# Patient Record
Sex: Female | Born: 1935 | Race: White | Hispanic: No
Health system: Southern US, Community
[De-identification: ages and names within clinical notes are randomized; demographics above are authoritative.]

## PROBLEM LIST (undated history)

## (undated) DIAGNOSIS — R197 Diarrhea, unspecified: Secondary | ICD-10-CM

## (undated) DIAGNOSIS — M25469 Effusion, unspecified knee: Secondary | ICD-10-CM

## (undated) DIAGNOSIS — H3530 Unspecified macular degeneration: Secondary | ICD-10-CM

## (undated) DIAGNOSIS — M1990 Unspecified osteoarthritis, unspecified site: Secondary | ICD-10-CM

## (undated) DIAGNOSIS — H9190 Unspecified hearing loss, unspecified ear: Secondary | ICD-10-CM

## (undated) DIAGNOSIS — R300 Dysuria: Secondary | ICD-10-CM

## (undated) DIAGNOSIS — Z800 Family history of malignant neoplasm of digestive organs: Secondary | ICD-10-CM

## (undated) DIAGNOSIS — R071 Chest pain on breathing: Secondary | ICD-10-CM

## (undated) HISTORY — DX: Dysuria: R30.0

## (undated) HISTORY — PX: OTHER SURGICAL HISTORY: SHX169

## (undated) HISTORY — DX: Effusion, unspecified knee: M25.469

## (undated) HISTORY — DX: Chest pain on breathing: R07.1

## (undated) HISTORY — DX: Family history of malignant neoplasm of digestive organs: Z80.0

## (undated) HISTORY — DX: Diarrhea, unspecified: R19.7

## (undated) HISTORY — PX: APPENDECTOMY: SHX54

---

## 1991-11-05 HISTORY — PX: OTHER SURGICAL HISTORY: SHX169

## 2005-12-12 ENCOUNTER — Ambulatory Visit: Payer: Self-pay | Admitting: Family Medicine

## 2006-01-24 ENCOUNTER — Ambulatory Visit: Payer: Self-pay | Admitting: Family Medicine

## 2006-11-17 ENCOUNTER — Ambulatory Visit: Payer: Self-pay | Admitting: Family Medicine

## 2006-11-27 ENCOUNTER — Ambulatory Visit: Payer: Self-pay | Admitting: Family Medicine

## 2007-09-26 DIAGNOSIS — M25469 Effusion, unspecified knee: Secondary | ICD-10-CM

## 2007-09-29 ENCOUNTER — Ambulatory Visit: Payer: Self-pay | Admitting: Family Medicine

## 2007-10-06 ENCOUNTER — Ambulatory Visit: Payer: Self-pay | Admitting: Family Medicine

## 2008-01-19 ENCOUNTER — Telehealth: Payer: Self-pay | Admitting: Family Medicine

## 2008-07-17 DIAGNOSIS — R071 Chest pain on breathing: Secondary | ICD-10-CM

## 2008-07-20 ENCOUNTER — Ambulatory Visit: Payer: Self-pay | Admitting: Family Medicine

## 2009-09-19 ENCOUNTER — Encounter: Payer: Self-pay | Admitting: Family Medicine

## 2009-09-19 DIAGNOSIS — R197 Diarrhea, unspecified: Secondary | ICD-10-CM | POA: Insufficient documentation

## 2009-09-20 ENCOUNTER — Ambulatory Visit: Payer: Self-pay | Admitting: Internal Medicine

## 2009-09-20 ENCOUNTER — Telehealth (INDEPENDENT_AMBULATORY_CARE_PROVIDER_SITE_OTHER): Payer: Self-pay

## 2009-09-20 DIAGNOSIS — M1990 Unspecified osteoarthritis, unspecified site: Secondary | ICD-10-CM | POA: Insufficient documentation

## 2009-09-21 LAB — CONVERTED CEMR LAB
ALT: 14 units/L (ref 0–35)
AST: 21 units/L (ref 0–37)
Alkaline Phosphatase: 68 units/L (ref 39–117)
Basophils Absolute: 0 10*3/uL (ref 0.0–0.1)
Eosinophils Relative: 0.8 % (ref 0.0–5.0)
HCT: 41.2 % (ref 36.0–46.0)
Lymphs Abs: 1.1 10*3/uL (ref 0.7–4.0)
MCHC: 33.6 g/dL (ref 30.0–36.0)
MCV: 94.4 fL (ref 78.0–100.0)
Monocytes Absolute: 0.3 10*3/uL (ref 0.1–1.0)
Platelets: 198 10*3/uL (ref 150.0–400.0)
RDW: 11.5 % (ref 11.5–14.6)
Sodium: 145 meq/L (ref 135–145)
Total Bilirubin: 0.8 mg/dL (ref 0.3–1.2)
Total Protein: 6.7 g/dL (ref 6.0–8.3)

## 2009-10-06 ENCOUNTER — Ambulatory Visit: Payer: Self-pay | Admitting: Internal Medicine

## 2009-10-06 LAB — CONVERTED CEMR LAB
Fecal Occult Blood: NEGATIVE
OCCULT 1: NEGATIVE
OCCULT 5: NEGATIVE

## 2009-10-09 ENCOUNTER — Ambulatory Visit: Payer: Self-pay | Admitting: Family Medicine

## 2009-10-09 DIAGNOSIS — R300 Dysuria: Secondary | ICD-10-CM | POA: Insufficient documentation

## 2009-10-09 LAB — CONVERTED CEMR LAB
Glucose, Urine, Semiquant: NEGATIVE
Ketones, urine, test strip: NEGATIVE
Nitrite: NEGATIVE
Protein, U semiquant: 30

## 2009-10-16 ENCOUNTER — Telehealth: Payer: Self-pay | Admitting: Family Medicine

## 2009-10-23 ENCOUNTER — Telehealth: Payer: Self-pay | Admitting: Family Medicine

## 2009-10-30 ENCOUNTER — Ambulatory Visit: Payer: Self-pay | Admitting: Internal Medicine

## 2009-11-01 ENCOUNTER — Telehealth: Payer: Self-pay | Admitting: Family Medicine

## 2009-11-06 ENCOUNTER — Ambulatory Visit: Payer: Self-pay | Admitting: Family Medicine

## 2009-11-14 ENCOUNTER — Emergency Department (HOSPITAL_COMMUNITY): Admission: EM | Admit: 2009-11-14 | Discharge: 2009-11-14 | Payer: Self-pay | Admitting: Emergency Medicine

## 2009-11-21 ENCOUNTER — Telehealth: Payer: Self-pay | Admitting: Family Medicine

## 2009-12-13 ENCOUNTER — Ambulatory Visit: Payer: Self-pay | Admitting: Internal Medicine

## 2009-12-13 DIAGNOSIS — K3189 Other diseases of stomach and duodenum: Secondary | ICD-10-CM | POA: Insufficient documentation

## 2009-12-13 DIAGNOSIS — R1013 Epigastric pain: Secondary | ICD-10-CM

## 2009-12-14 ENCOUNTER — Ambulatory Visit: Payer: Self-pay | Admitting: Internal Medicine

## 2009-12-17 ENCOUNTER — Encounter (INDEPENDENT_AMBULATORY_CARE_PROVIDER_SITE_OTHER): Payer: Self-pay | Admitting: *Deleted

## 2009-12-17 ENCOUNTER — Telehealth: Payer: Self-pay | Admitting: Gastroenterology

## 2009-12-17 ENCOUNTER — Emergency Department (HOSPITAL_COMMUNITY): Admission: EM | Admit: 2009-12-17 | Discharge: 2009-12-17 | Payer: Self-pay | Admitting: Emergency Medicine

## 2009-12-18 ENCOUNTER — Telehealth: Payer: Self-pay | Admitting: Internal Medicine

## 2009-12-18 ENCOUNTER — Encounter: Payer: Self-pay | Admitting: Internal Medicine

## 2010-01-01 ENCOUNTER — Telehealth: Payer: Self-pay | Admitting: Family Medicine

## 2010-01-02 ENCOUNTER — Ambulatory Visit: Payer: Self-pay | Admitting: Family Medicine

## 2010-01-02 DIAGNOSIS — F3289 Other specified depressive episodes: Secondary | ICD-10-CM | POA: Insufficient documentation

## 2010-01-02 DIAGNOSIS — F329 Major depressive disorder, single episode, unspecified: Secondary | ICD-10-CM

## 2010-01-03 ENCOUNTER — Telehealth: Payer: Self-pay | Admitting: Family Medicine

## 2010-01-26 ENCOUNTER — Telehealth: Payer: Self-pay | Admitting: Internal Medicine

## 2010-01-30 ENCOUNTER — Telehealth: Payer: Self-pay | Admitting: Family Medicine

## 2010-01-31 ENCOUNTER — Telehealth: Payer: Self-pay | Admitting: Family Medicine

## 2010-02-12 ENCOUNTER — Ambulatory Visit: Payer: Self-pay | Admitting: Family Medicine

## 2010-02-12 LAB — CONVERTED CEMR LAB
Specific Gravity, Urine: >=1.03
WBC Urine, dipstick: NEGATIVE
pH: 5

## 2010-03-01 ENCOUNTER — Ambulatory Visit: Payer: Self-pay | Admitting: Family Medicine

## 2010-03-01 DIAGNOSIS — H9190 Unspecified hearing loss, unspecified ear: Secondary | ICD-10-CM | POA: Insufficient documentation

## 2010-06-12 ENCOUNTER — Encounter: Payer: Self-pay | Admitting: Family Medicine

## 2010-11-25 ENCOUNTER — Encounter: Payer: Self-pay | Admitting: Internal Medicine

## 2010-12-04 NOTE — Progress Notes (Signed)
Summary: uncomf urinating again t pt  Phone Note Call from Patient Call back at Home Phone [phone] Call back at [address] a friend [phone] or [phone]   Call For: k for todd Summary of Call: Finished the amoxicillin for the urinary problem on 16th.  Last night feeling started again, uncomfortable when she urinates.  Thinks she needs more or a different med.  Gweneth Dimitri & delivery requested.  Septra makes her sick.   Initial call taken by: Rudy Jew, RN,  January 26, 2010 11:20 AM  Follow-up for Phone Call        Phone Call Completed Follow-up by: Rudy Jew, RN,  January 26, 2010 1:16 PM    New/Updated Medications: CIPROFLOXACIN HCL 500 MG TABS (CIPROFLOXACIN HCL) One bid Prescriptions: CIPROFLOXACIN HCL 500 MG TABS (CIPROFLOXACIN HCL) One bid  #14 x 0   Entered by:   Rudy Jew, RN   Authorized by:   Gordy Savers  MD   Signed by:   Rudy Jew, RN on 01/26/2010   Method used:   Electronically to        [facility]* (retail)       [address]       [place], [place]  [address]       Ph: [phone] or [phone]       Fax: [phone]   RxID:   [id number]  Generic Cipro 500 mg, number 14 one twice daily

## 2010-12-04 NOTE — Letter (Signed)
Summary: Patient [facility] Biopsy Results  [facility]  [address] [place], [place] [address]   Phone: [phone]  Fax: [phone]        December 18, 2009 MRN: [id number]    [facility] [address] [place], [place]  [address]    Dear Ms. Edmonston,  I am pleased to inform you that the biopsies taken during your recent endoscopic examination did not show any evidence of cancer upon pathologic examination.The biopsy from Your stomach shows gastritis.  Additional information/recommendations:  __No further action is needed at this time.  Please follow-up with      your primary care physician for your other healthcare needs.  __ Please call [phone] to schedule a return visit to review      your condition.  _x_ Continue with the treatment plan as outlined on the day of your      exam.  Your gall bladder ultrasound  showed small polyps but no stones, I hope You are feeling better.   Please call us if you are having persistent problems or have questions about your condition that have not been fully answered at this time.  Sincerely,  Hart Carwin MD  This letter has been electronically signed by your physician.  Appended Document: Patient Notice-Endo Biopsy Results Letter mailed 2.16.11

## 2010-12-04 NOTE — Letter (Signed)
Summary: EGD Instructions  [facility]  [address] [place], [place] [address]   Phone: [phone]  Fax: [phone]       Deborah Gilbert    07-31-1920    MRN: [id number]       Procedure Day /Date: 12/14/09     Arrival Time: 2:30 pm     Procedure Time: 3:30 pm     Location of Procedure:                    _ x _ [facility] (4th Floor)   PREPARATION FOR ENDOSCOPY   On  THE DAY OF THE PROCEDURE:  1.   No solid foods, milk or milk products are allowed after midnight the night before your procedure.  2.   Do not drink anything colored red or purple.  Avoid juices with pulp.  No orange juice.  3.  You may drink clear liquids until 1:30 pm, which is 2 hours before your procedure.                                                                                                CLEAR LIQUIDS INCLUDE: Water Jello Ice Popsicles Tea (sugar ok, no milk/cream) Powdered fruit flavored drinks Coffee (sugar ok, no milk/cream) Gatorade Juice: apple, white grape, white cranberry  Lemonade Clear bullion, consomm, broth Carbonated beverages (any kind) Strained chicken noodle soup Hard Candy   MEDICATION INSTRUCTIONS  Unless otherwise instructed, you should take regular prescription medications with a small sip of water as early as possible the morning of your procedure.                OTHER INSTRUCTIONS  You will need a responsible adult at least 75 years of age to accompany you and drive you home.   This person must remain in the waiting room during your procedure.  Wear loose fitting clothing that is easily removed.  Leave jewelry and other valuables at home.  However, you may wish to bring a book to read or an iPod/MP3 player to listen to music as you wait for your procedure to start.  Remove all body piercing jewelry and leave at home.  Total time from sign-in until discharge is approximately 2-3 hours.  You should go home directly  after your procedure and rest.  You can resume normal activities the day after your procedure.  The day of your procedure you should not:   Drive   Make legal decisions   Operate machinery   Drink alcohol   Return to work  You will receive specific instructions about eating, activities and medications before you leave.    The above instructions have been reviewed and explained to me by   Hortense Ramal, CMA   I fully understand and can verbalize these instructions _____________________________ Date 12/13/09

## 2010-12-04 NOTE — Progress Notes (Signed)
Summary: [facility] about appt 3-29  Phone Note Call from Patient Call back at Home Phone [phone]   Caller: vm Mon 4:33 Reason for Call: Talk to Nurse Summary of Call: On Cipro.  Having fine tingling in foot all of a sudden.   Initial call taken by: Rudy Jew, RN,  January 30, 2010 8:29 AM  Follow-up for Phone Call        AM call same message.  Had awful night.  Says she cannot take the Cipro.  Had chest pain.   Rudy Jew, RN  January 30, 2010 8:31 AM Patient is very hard of hearing on phone.  Has to be well for dental surgery 4-14.   Offered ov.  She will call back if she can arrange this.  Rudy Jew, RN  January 30, 2010 8:38 AM    Additional Follow-up for Phone Call Additional follow up Details #1::        Pt cannot come in until tomorrow.  Appt given. Additional Follow-up by: Lynann Beaver CMA,  January 30, 2010 8:46 AM

## 2010-12-04 NOTE — Progress Notes (Signed)
Summary: med questions?  Phone Note Call from Patient   Caller: Mrs. Day? Call For: Roderick Pee MD Summary of Call: Pt wants to stop the Amoxicillin due to dizziness.  Advised not to stop the antibiotic, and we will tell Dr. Tawanna Cooler in the am.  She needs the antibiotic for the UTI.  Discussed with Fleet Contras also. Initial call taken by: Lynann Beaver CMA,  January 03, 2010 1:55 PM  Follow-up for Phone Call        Provider Notified Follow-up by: Roderick Pee MD,  January 04, 2010 8:36 AM

## 2010-12-04 NOTE — Progress Notes (Signed)
Summary: anxious  Phone Note Call from Patient   Caller: Patient Call For: Roderick Pee MD Summary of Call: Pt is calling very anxious and crying with extreme dizziness and weaknes.. Refuses to come to the office.  States she is too weak.  Feels she may have an UTI.  Vague symptoms.  "Just does not feel right when she passes urine.  No dysuria.  Had endoscopy 2 weeks ago. Please call and let her know what Dr Tawanna Cooler wants her to do.  She is very difficult to get any history from due to her anxiety level and she is extremely frustrated with her medical condition. [phone] Initial call taken by: Lynann Beaver CMA,  January 01, 2010 9:42 AM  Follow-up for Phone Call        if her symptoms are related to the questionable urinary tract issue that I need to see her here today, if it's related to anxiety that I would refer her to [facility] Follow-up by: Roderick Pee MD,  January 01, 2010 12:05 PM  Additional Follow-up for Phone Call Additional follow up Details #1::        Pt. will call if she can arrange to get here.  Refuses [facility]. Additional Follow-up by: Lynann Beaver CMA,  January 01, 2010 12:25 PM

## 2010-12-04 NOTE — Miscellaneous (Signed)
Summary: Health Care Power of [facility] Power of Attorney   Imported By: Maryln Gottron 07/04/2010 14:39:16  _____________________________________________________________________  External Attachment:    Type:   Image     Comment:   External Document

## 2010-12-04 NOTE — Progress Notes (Signed)
Summary: MD ON-CALL NOTE      This is a patient Dr. Lina Sar.      [name] called today at around noon.  She had an upper   endoscopy 5 days ago and it sounds as if it was fairly normal.  She   tolerated the procedure well and went home and has felt well ever since   then.  She actually felt stronger than her usual yesterday.  Today, she   woke up feeling very weak.  She has no abdominal pains, no nausea, no   vomiting.  She tolerated breakfast well.  She has had no overt bleeding.   She is clearly elderly, she sounds somewhat confused on the phone, she   has no chest pains or shortness of breath, but just keeps telling me she   is weak and she needs help.  I recommended that she hang up the phone   and dial 911.  It is not clear to me that she had any complication from   her routine upper endoscopy.            Rachael Fee, MD            DPJ/MedQ  DD: 12/17/2009  DT: 12/18/2009  Job #: [id number]

## 2010-12-04 NOTE — Progress Notes (Signed)
Summary: nerve pill  Phone Note Call from Patient   Caller: Deborah Gilbert Call For: Deborah Pee MD Summary of Call: Requesting a nerve pill??? [facility] [phone] Initial call taken by: Lynann Beaver CMA,  November 21, 2009 1:20 PM  Follow-up for Phone Call        OTC Benadryl 25 mg nightly, p.r.n. would not recommend medication with her age Follow-up by: Deborah Pee MD,  November 21, 2009 1:42 PM    New/Updated Medications: BENADRYL 25 MG CAPS (DIPHENHYDRAMINE HCL) one by mouth q hs as needed for sleepj Prescriptions: BENADRYL 25 MG CAPS (DIPHENHYDRAMINE HCL) one by mouth q hs as needed for sleepj  #30 x prn   Entered by:   Lynann Beaver CMA   Authorized by:   Deborah Pee MD   Signed by:   Lynann Beaver CMA on 11/21/2009   Method used:   Electronically to        [facility]  [phone]* (retail)       [address]       [place], [place]  [address]       Ph: [phone] or [phone]       Fax: [phone]   RxID:   [id number]

## 2010-12-04 NOTE — Assessment & Plan Note (Signed)
Summary: wants another ua test//ccm   Vital Signs:  Patient profile:   77 year old female Weight:      109 pounds Temp:     97.3 degrees F oral Pulse rate:   80 / minute BP sitting:   160 / 80  (left arm) Cuff size:   regular  Vitals Entered By: Kern Reap CMA Duncan Dull) (February 12, 2010 11:27 AM) CC: patient requests a UA   Primary Care Provider:  Eugenio Hoes. Tawanna Cooler, MD   CC:  patient requests a UA.  History of Present Illness: Deborah Gilbert is an 77 year old, widowed female, who comes in today accompanied by the church, person.  She has a chronic concern about her urine.  She wants it checked rather frequently.  She has no urinary tract complaints.  Urinalysis normal.  Advised her if she does have fever, chills, burning, etc., to come back for recheck.  Otherwise, her urinalysis is normal.  She continues to have other somatic complaints.  In the past.  I recommend we get a psychiatric consult.  The patient has so far refused.  [address] [phone]  has been contacted  Allergies: 1)  ! Tetracycline 2)  ! Pepcid  Past History:  Past medical, surgical, family and social histories (including risk factors) reviewed for relevance to current acute and chronic problems.  Past Medical History: Reviewed history from 12/07/2009 and no changes required. Current Problems:   CHEST WALL PAIN, ACUTE (ICD-786.52) JOINT EFFUSION, RIGHT KNEE (ICD-719.06)  DYSURIA (ICD-788.1) OSTEOARTHRITIS (ICD-715.9) FAMILY HX COLON CANCER (ICD-V16.0) DIARRHEA (ICD-787.91) CHEST WALL PAIN, ACUTE (ICD-786.52) JOINT EFFUSION, RIGHT KNEE (ICD-719.06)    Past Surgical History: Reviewed history from 09/20/2009 and no changes required. Appendectomy Hip sx-right-1993 CHILD BIRTH CATARACT SURGERY/BILTAERAL  Family History: Reviewed history from 12/07/2009 and no changes required. Fam hx Cirrhosis SON WITH HX COLON CA AGE 90.  Social History: Reviewed history from 09/20/2009 and no changes required. Former  Smoker Single Alcohol use-no Drug use-no Regular exercise-no Occupation: Retired   Review of Systems      See HPI  Physical Exam  General:  Well-developed,well-nourished,in no acute distress; alert,appropriate and cooperative throughout examination   Impression & Recommendations:  Problem # 1:  DYSURIA (ICD-788.1) Assessment Unchanged  Her updated medication list for this problem includes:    Amoxicillin 500 Mg Caps (Amoxicillin) .Marland Kitchen... Take 1 tablet by mouth two times a day    Ciprofloxacin Hcl 500 Mg Tabs (Ciprofloxacin hcl) ..... One bid  Orders: UA Dipstick w/o Micro (manual) ([id number])  Complete Medication List: 1)  Aspirin 81 Mg Tabs (Aspirin) .... One tablet by mouth once daily 2)  Multivitamins Tabs (Multiple vitamin) .... One tablet by mouth once daily 3)  Calcium-vitamin D 250-125 Mg-unit Tabs (Calcium carbonate-vitamin d) .... One tablet by mouth once daily 4)  Promethazine Hcl 12.5 Mg Tabs (Promethazine hcl) .... One every 6 hours as needed for nausea 5)  Benadryl 25 Mg Caps (Diphenhydramine hcl) .... One by mouth q hs as needed for sleep 6)  Promethazine Hcl 6.25 Mg/63ml Syrp (Promethazine hcl) .... Take 5 ml (6.25 mg) by mouth every 4-6 hours as needed for nausea 7)  Aciphex 20 Mg Tbec (Rabeprazole sodium) .... Take 1 tablet by mouth once a day (samples given) 8)  Amoxicillin 500 Mg Caps (Amoxicillin) .... Take 1 tablet by mouth two times a day 9)  Ciprofloxacin Hcl 500 Mg Tabs (Ciprofloxacin hcl) .... One bid  Patient Instructions: 1)  continue your current treatment program, your urinalysis is  normal  Laboratory Results   Urine Tests  Date/Time Received: February 12, 2010   Routine Urinalysis   Color: yellow Appearance: Clear Glucose: negative   (Normal Range: Negative) Bilirubin: negative   (Normal Range: Negative) Ketone: negative   (Normal Range: Negative) Spec. Gravity: >=1.030   (Normal Range: 1.003-1.035) Blood: trace-lysed   (Normal Range:  Negative) pH: 5.0   (Normal Range: 5.0-8.0) Protein: negative   (Normal Range: Negative) Urobilinogen: 0.2   (Normal Range: 0-1) Nitrite: negative   (Normal Range: Negative) Leukocyte Esterace: negative   (Normal Range: Negative)    Comments: Kern Reap CMA Duncan Dull)  February 12, 2010 11:54 AM

## 2010-12-04 NOTE — Progress Notes (Signed)
Summary: follow up  Phone Note Call from Patient   Summary of Call: patient states she had another "attack" last night.  She had some chest pain that went to her back.  No other pain or shortness of breath.  She had some trouble falling asleep but she feels better now.   Initial call taken by: Kern Reap CMA Duncan Dull),  January 31, 2010 9:33 AM  Follow-up for Phone Call        new order per dr todd Follow-up by: Kern Reap CMA Duncan Dull),  January 31, 2010 10:14 AM

## 2010-12-04 NOTE — Assessment & Plan Note (Signed)
Summary: FOLLOW UP-DIARRHEA/YF   History of Present Illness Visit Type: follow up  Primary GI MD: Lina Sar MD Primary Provider: Eugenio Hoes. Tawanna Cooler, MD  Requesting Provider: n/a Chief Complaint: Nausea and dizziness  History of Present Illness:   This is an 77 year old white female with a several month history of nausea and dyspepsia. She was seen here in November for diarrhea but that is not her current problem. She is complaining of epigastric pain anteriorly which is worse when sitting up and is relieved by laying down. It does not radiate to the back and it is not associated with eating. There is a family history of gallbladder disease in her maternal cousin. Her weight has remained stable. There has been no fever. Her hemoglobin in November 2010 was 13.8 and her hematocrit was 41.2. There is a positive family history of colon cancer in her son.   GI Review of Systems    Reports nausea.      Denies abdominal pain, acid reflux, belching, bloating, chest pain, dysphagia with liquids, dysphagia with solids, heartburn, loss of appetite, vomiting, vomiting blood, weight loss, and  weight gain.        Denies anal fissure, black tarry stools, change in bowel habit, constipation, diarrhea, diverticulosis, fecal incontinence, heme positive stool, hemorrhoids, irritable bowel syndrome, jaundice, light color stool, liver problems, rectal bleeding, and  rectal pain.    Current Medications (verified): 1)  Aspirin 81 Mg  Tabs (Aspirin) .... One Tablet By Mouth Once Daily 2)  Multivitamins   Tabs (Multiple Vitamin) .... One Tablet By Mouth Once Daily 3)  Calcium-Vitamin D 250-125 Mg-Unit Tabs (Calcium Carbonate-Vitamin D) .... One Tablet By Mouth Once Daily 4)  Septra Ds 800-160 Mg Tabs (Sulfamethoxazole-Trimethoprim) .... Take 1 Tablet By Mouth Two Times A Day 5)  Promethazine Hcl 12.5 Mg Tabs (Promethazine Hcl) .... One Every 6 Hours As Needed For Nausea 6)  Benadryl 25 Mg Caps (Diphenhydramine Hcl)  .... One By Mouth Q Hs As Needed For Sleep  Allergies: 1)  ! Tetracycline 2)  ! Pepcid  Past History:  Past Medical History: Reviewed history from 12/07/2009 and no changes required. Current Problems:   CHEST WALL PAIN, ACUTE (ICD-786.52) JOINT EFFUSION, RIGHT KNEE (ICD-719.06)  DYSURIA (ICD-788.1) OSTEOARTHRITIS (ICD-715.9) FAMILY HX COLON CANCER (ICD-V16.0) DIARRHEA (ICD-787.91) CHEST WALL PAIN, ACUTE (ICD-786.52) JOINT EFFUSION, RIGHT KNEE (ICD-719.06)    Past Surgical History: Reviewed history from 09/20/2009 and no changes required. Appendectomy Hip sx-right-1993 CHILD BIRTH CATARACT SURGERY/BILTAERAL  Family History: Reviewed history from 12/07/2009 and no changes required. Fam hx Cirrhosis SON WITH HX COLON CA AGE 28.  Social History: Reviewed history from 09/20/2009 and no changes required. Former Smoker Single Alcohol use-no Drug use-no Regular exercise-no Occupation: Retired   Review of Systems       The patient complains of fatigue.  The patient denies allergy/sinus, anemia, anxiety-new, arthritis/joint pain, back pain, blood in urine, breast changes/lumps, change in vision, confusion, cough, coughing up blood, depression-new, fainting, fever, headaches-new, hearing problems, heart murmur, heart rhythm changes, itching, menstrual pain, muscle pains/cramps, night sweats, nosebleeds, pregnancy symptoms, shortness of breath, skin rash, sleeping problems, sore throat, swelling of feet/legs, swollen lymph glands, thirst - excessive , urination - excessive , urination changes/pain, urine leakage, vision changes, and voice change.         Pertinent positive and negative review of systems were noted in the above HPI. All other ROS was otherwise negative.   Vital Signs:  Patient profile:  77 year old female Height:      63 inches Weight:      112 pounds BSA:     1.51 Pulse rate:   72 / minute Pulse rhythm:   regular BP sitting:   140 / 80  (left arm) Cuff  size:   regular  Vitals Entered By: Ok Anis CMA (December 13, 2009 2:01 PM)  Physical Exam  General:  elderly, hard of hearing, alert and oriented. She is complaining of feeling sick. Eyes:  PERRLA, no icterus. Mouth:  No deformity or lesions, dentition normal. Lungs:  Clear throughout to auscultation. Heart:  S1-S2, frequent irregular beats. Abdomen:  very tender epigastrium with normoactive bowel sounds and no rebound. No distention. No bruit. Liver edge at costal margin. No ascites. Extremities:  No clubbing, cyanosis, edema or deformities noted. Skin:  Intact without significant lesions or rashes. Psych:  Alert and cooperative. Normal mood and affect.   Impression & Recommendations:  Problem # 1:  DYSPEPSIA (ICD-536.8) Patiet has food intolerance and epigastric pain dating back to when patient took Septra. We need to rule out H. pylori gastritis ,rule or a gastric ulcer. We also need to rule out symptomatic gallbladder disease or a large hiatal hernia. We will start patient on samples of AcipHex 20 mg daily and schedule her for an upper endoscopy as well as upper abdominal ultrasound. She will take liquid Phenergan 6.25 mg every 4-6 hours p.r.n. nausea. Orders: EGD (EGD)  Problem # 2:  FAMILY HX COLON CANCER (ICD-V16.0) Patient has a history of colon cancer in her son. At this point there are no plans for colonoscopy.  Patient Instructions: 1)  Phenergan elixir 6.25 mg p.r.n. nausea. 2)  Samples of AcipHex 20 mg daily. 3)  Upper endoscopy tomorrow. 4)  Upper abdominal ultrasound. 5)  Consider colorectal screening depending on patient's general condition. 6)  Copy sent to : Dr Shela Commons.[name] 7)  The medication list was reviewed and reconciled.  All changed / newly prescribed medications were explained.  A complete medication list was provided to the patient / caregiver. Prescriptions: PROMETHAZINE HCL 6.25 MG/5ML SYRP (PROMETHAZINE HCL) Take 5 ml (6.25 mg) by mouth every 4-6 hours  as needed for nausea  #10 ounces x 0   Entered by:   Hortense Ramal CMA (AAMA)   Authorized by:   Hart Carwin MD   Signed by:   Hortense Ramal CMA (AAMA) on 12/13/2009   Method used:   Electronically to        [facility]  [phone]* (retail)       [address]       [place], [place]  [address]       Ph: [phone] or [phone]       Fax: [phone]   RxID:   [id number]   Appended Document: Orders Update    Clinical Lists Changes  Orders: Added new Test order of Ultrasound Abdomen (UAS) - Signed

## 2010-12-04 NOTE — Assessment & Plan Note (Signed)
Summary: fup//ccm   Vital Signs:  Patient profile:   77 year old female Temp:     97.8 degrees F oral Pulse rate:   86 / minute Pulse rhythm:   regular BP sitting:   120 / 90  (left arm) Cuff size:   regular  Vitals Entered By: Kern Reap CMA Duncan Dull) (January 02, 2010 12:18 PM)  Reason for Visit follow up and nervous  Primary Care Provider:  Eugenio Hoes. Tawanna Cooler, MD    History of Present Illness: Deborah Gilbert is 77 year old single female, resident at [facility], who is brought in by her friend for evaluation of multiple symptoms.  She recently went to [facility] ER  on the 13th of February by ambulance for evaluation of multiple complaints.  She had numerous diagnostic studies, which included lab work, EKG, and CT scan of her abdomen.  All of which were normal.  She complains of anxiety, lightheadedness, etc., etc., etc.  She also complains of dysuria.  She has no fever, chills, or back pain.  Urinalysis shows some nitrate, and 2+ blood, consistent with a cystitis  Allergies: 1)  ! Tetracycline 2)  ! Pepcid  Past History:  Past medical, surgical, family and social histories (including risk factors) reviewed for relevance to current acute and chronic problems.  Past Medical History: Reviewed history from 12/07/2009 and no changes required. Current Problems:   CHEST WALL PAIN, ACUTE (ICD-786.52) JOINT EFFUSION, RIGHT KNEE (ICD-719.06)  DYSURIA (ICD-788.1) OSTEOARTHRITIS (ICD-715.9) FAMILY HX COLON CANCER (ICD-V16.0) DIARRHEA (ICD-787.91) CHEST WALL PAIN, ACUTE (ICD-786.52) JOINT EFFUSION, RIGHT KNEE (ICD-719.06)    Past Surgical History: Reviewed history from 09/20/2009 and no changes required. Appendectomy Hip sx-right-1993 CHILD BIRTH CATARACT SURGERY/BILTAERAL  Family History: Reviewed history from 12/07/2009 and no changes required. Fam hx Cirrhosis SON WITH HX COLON CA AGE 32.  Social History: Reviewed history from 09/20/2009 and no changes  required. Former Smoker Single Alcohol use-no Drug use-no Regular exercise-no Occupation: Retired   Review of Systems      See HPI  Physical Exam  General:  Well-developed,well-nourished,in no acute distress; alert,appropriate and cooperative throughout examination   Problems:  Medical Problems Added: 1)  Dx of Depressive Disorder  (ICD-311)  Impression & Recommendations:  Problem # 1:  DYSURIA (ICD-788.1) Assessment Deteriorated  The following medications were removed from the medication list:    Septra Ds 800-160 Mg Tabs (Sulfamethoxazole-trimethoprim) .Marland Kitchen... Take 1 tablet by mouth two times a day x 7 days, then 1/2 tab daily x 3 weeks Her updated medication list for this problem includes:    Amoxicillin 500 Mg Caps (Amoxicillin) .Marland Kitchen... Take 1 tablet by mouth two times a day  Orders: Prescription Created Electronically [phone])  Problem # 2:  DEPRESSIVE DISORDER (ICD-311) Assessment: Deteriorated  Orders: Prescription Created Electronically [id number])  Complete Medication List: 1)  Aspirin 81 Mg Tabs (Aspirin) .... One tablet by mouth once daily 2)  Multivitamins Tabs (Multiple vitamin) .... One tablet by mouth once daily 3)  Calcium-vitamin D 250-125 Mg-unit Tabs (Calcium carbonate-vitamin d) .... One tablet by mouth once daily 4)  Promethazine Hcl 12.5 Mg Tabs (Promethazine hcl) .... One every 6 hours as needed for nausea 5)  Benadryl 25 Mg Caps (Diphenhydramine hcl) .... One by mouth q hs as needed for sleep 6)  Promethazine Hcl 6.25 Mg/74ml Syrp (Promethazine hcl) .... Take 5 ml (6.25 mg) by mouth every 4-6 hours as needed for nausea 7)  Aciphex 20 Mg Tbec (Rabeprazole sodium) .... Take 1 tablet by mouth  once a day (samples given) 8)  Amoxicillin 500 Mg Caps (Amoxicillin) .... Take 1 tablet by mouth two times a day  Patient Instructions: 1)  begin amoxicillin 500 mg twice a day for two weeks. 2)  Return p.r.n.Marland Kitchen 3)  Discussed with the cousin, who is  power-of-attorney.  A more advanced to level of care.  Another option would be a psychiatric consult Prescriptions: AMOXICILLIN 500 MG CAPS (AMOXICILLIN) Take 1 tablet by mouth two times a day  #30 x 0   Entered and Authorized by:   Roderick Pee MD   Signed by:   Roderick Pee MD on 01/02/2010   Method used:   Electronically to        [facility]  [phone]* (retail)       [address]       [place], [place]  [address]       Ph: [phone] or [phone]       Fax: [phone]   RxID:   [id number] SEPTRA DS 800-160 MG TABS (SULFAMETHOXAZOLE-TRIMETHOPRIM) Take 1 tablet by mouth two times a day x 7 days, then 1/2 tab daily x 3 weeks  #25 x 0   Entered and Authorized by:   Roderick Pee MD   Signed by:   Roderick Pee MD on 01/02/2010   Method used:   Electronically to        [facility]  [phone]* (retail)       [address]       [place], [place]  [address]       Ph: [phone] or [phone]       Fax: [phone]   RxID:   [id number]

## 2010-12-04 NOTE — Assessment & Plan Note (Signed)
Summary: ? bladder infection-go to lab first per rachel//ccm   Vital Signs:  Patient profile:   77 year old female Temp:     98.7 degrees F oral BP sitting:   130 / 80  (left arm) Cuff size:   regular  Vitals Entered By: Kern Reap CMA Duncan Dull) (November 06, 2009 10:36 AM)  Reason for Visit light headed, nausea, polyuria  Primary Care Provider:  Eugenio Hoes. Tawanna Cooler, MD    History of Present Illness: Deborah Gilbert is an 77 year old single female, who resides in a retirement center, who is brought in by her ex-pastors wife for evaluation of nausea.  We saw her about a month ago for urinary tract infection.  Start her on Septra DS b.i.d. however, she complained of nausea.  We therefore stopped the medication.  She continues to complain of nausea.  She has no fever, vomiting, or diarrhea.  She was recently told she has macular degeneration and the caregiver, who accompanies her today, says she's been extremely worried about her eye condition.  Review of systems otherwise negative except the caregiver says she has frequent urination.  However, today she is urinating normally.  UA within normal limits  she was also seen by my partner, Dr. Kirtland Bouchard. last week for the same symptoms.  His evaluation was negative.  He gave her some Phenergan as a temporary measure, but it's not resolve the symptoms  Allergies: 1)  ! Tetracycline 2)  ! Pepcid  Past History:  Past medical, surgical, family and social histories (including risk factors) reviewed for relevance to current acute and chronic problems.  Past Medical History: Reviewed history from 09/20/2009 and no changes required. Current Problems:   CHEST WALL PAIN, ACUTE (ICD-786.52) JOINT EFFUSION, RIGHT KNEE (ICD-719.06)  Past Surgical History: Reviewed history from 09/20/2009 and no changes required. Appendectomy Hip sx-right-1993 CHILD BIRTH CATARACT SURGERY/BILTAERAL  Family History: Reviewed history from 09/20/2009 and no changes  required. Fam hx Cirrhosis,SON WITH HX COLON CA AGE 31.  Social History: Reviewed history from 09/20/2009 and no changes required. Former Smoker Single Alcohol use-no Drug use-no Regular exercise-no Occupation: Retired   Review of Systems      See HPI  Physical Exam  General:  Well-developed,well-nourished,in no acute distress; alert,appropriate and cooperative throughout examination Abdomen:  Bowel sounds positive,abdomen soft and non-tender without masses, organomegaly or hernias noted.   Impression & Recommendations:  Problem # 1:  NAUSEA (ICD-787.02) Assessment Unchanged  Complete Medication List: 1)  Aspirin 81 Mg Tabs (Aspirin) .... One tablet by mouth once daily 2)  Multivitamins Tabs (Multiple vitamin) .... One tablet by mouth once daily 3)  Calcium-vitamin D 250-125 Mg-unit Tabs (Calcium carbonate-vitamin d) .... One tablet by mouth once daily 4)  Septra Ds 800-160 Mg Tabs (Sulfamethoxazole-trimethoprim) .... Take 1 tablet by mouth two times a day 5)  Promethazine Hcl 12.5 Mg Tabs (Promethazine hcl) .... One every 6 hours as needed for nausea  Other Orders: UA Dipstick w/o Micro (manual) (16109)  Patient Instructions: 1)  since the nausea has persisted call Dr. Julio Alm for consultation

## 2010-12-04 NOTE — Progress Notes (Signed)
Summary: TRIAGE  Phone Note Call from Patient Call back at Home Phone [phone]   Caller: Patient Call For: Dr. Juanda Chance Reason for Call: Talk to Nurse Summary of Call: Pt. went to ER yesterday and had Ultrasound done. Initial call taken by: Karna Christmas,  December 18, 2009 2:29 PM  Follow-up for Phone Call        Pt. went to the ER yesterday for weakness, she had her Ultrasound done while there. She is feeling well today.   DR.Vineta Carone PLEASE REVIEW ULTRASOUND AND ADVISE.  Follow-up by: Laureen Ochs LPN,  December 18, 2009 3:18 PM  Additional Follow-up for Phone Call Additional follow up Details #1::        please see my append after sonogram report. Additional Follow-up by: Hart Carwin MD,  December 18, 2009 9:36 PM

## 2010-12-04 NOTE — Assessment & Plan Note (Signed)
Summary: ear pain ok per judy/njr/pt rescd to dr todd//ccm   Vital Signs:  Patient profile:   77 year old female Height:      63 inches Weight:      113 pounds BMI:     20.09 Temp:     97.8 degrees F oral BP sitting:   120 / 80  (left arm) Cuff size:   regular  Vitals Entered By: Kern Reap CMA Duncan Dull) (March 01, 2010 2:32 PM) CC: lightheaded, clogged left ear   Primary Care Provider:  Eugenio Hoes. Tawanna Cooler, MD   CC:  lightheaded and clogged left ear.  History of Present Illness: Deborah Gilbert  is an 77 year old female, who is brought in by the minister.  His wife, who is one of her caregivers for evaluation of hearing loss in her left ear.  She's had long-term intermittent vertigo, but now she has hearing loss.  Examination shows a cerumen impaction, which was relieved by suction by me  Allergies: 1)  ! Tetracycline 2)  ! Pepcid  Past History:  Past medical, surgical, family and social histories (including risk factors) reviewed for relevance to current acute and chronic problems.  Past Medical History: Reviewed history from 12/07/2009 and no changes required. Current Problems:   CHEST WALL PAIN, ACUTE (ICD-786.52) JOINT EFFUSION, RIGHT KNEE (ICD-719.06)  DYSURIA (ICD-788.1) OSTEOARTHRITIS (ICD-715.9) FAMILY HX COLON CANCER (ICD-V16.0) DIARRHEA (ICD-787.91) CHEST WALL PAIN, ACUTE (ICD-786.52) JOINT EFFUSION, RIGHT KNEE (ICD-719.06)    Past Surgical History: Reviewed history from 09/20/2009 and no changes required. Appendectomy Hip sx-right-1993 CHILD BIRTH CATARACT SURGERY/BILTAERAL  Family History: Reviewed history from 12/07/2009 and no changes required. Fam hx Cirrhosis SON WITH HX COLON CA AGE 80.  Social History: Reviewed history from 09/20/2009 and no changes required. Former Smoker Single Alcohol use-no Drug use-no Regular exercise-no Occupation: Retired   Review of Systems      See HPI  Physical Exam  General:  Well-developed,well-nourished,in  no acute distress; alert,appropriate and cooperative throughout examination Head:  Normocephalic and atraumatic without obvious abnormalities. No apparent alopecia or balding. Eyes:  No corneal or conjunctival inflammation noted. EOMI. Perrla. Funduscopic exam benign, without hemorrhages, exudates or papilledema. Vision grossly normal. Ears:  right ear clear left ear.  Cerumen impaction relieved by suction   Impression & Recommendations:  Problem # 1:  DECREASED HEARING, LEFT EAR (ICD-389.9) Assessment New  Complete Medication List: 1)  Aspirin 81 Mg Tabs (Aspirin) .... One tablet by mouth once daily 2)  Multivitamins Tabs (Multiple vitamin) .... One tablet by mouth once daily 3)  Calcium-vitamin D 250-125 Mg-unit Tabs (Calcium carbonate-vitamin d) .... One tablet by mouth once daily 4)  Promethazine Hcl 12.5 Mg Tabs (Promethazine hcl) .... One every 6 hours as needed for nausea 5)  Benadryl 25 Mg Caps (Diphenhydramine hcl) .... One by mouth q hs as needed for sleep 6)  Promethazine Hcl 6.25 Mg/68ml Syrp (Promethazine hcl) .... Take 5 ml (6.25 mg) by mouth every 4-6 hours as needed for nausea 7)  Aciphex 20 Mg Tbec (Rabeprazole sodium) .... Take 1 tablet by mouth once a day (samples given)  Patient Instructions: 1)  Please schedule a follow-up appointment as needed.

## 2010-12-04 NOTE — Procedures (Signed)
Summary: Upper Endoscopy  Patient: Deborah Gilbert Note: All result statuses are Final unless otherwise noted.  Tests: (1) Upper Endoscopy (EGD)   EGD Upper Endoscopy       DONE     [facility]     [address]     [place], [place]  [address]           ENDOSCOPY PROCEDURE REPORT           PATIENT:  Deborah Gilbert, Deborah Gilbert  MR#:  [id number]     BIRTHDATE:  [birth year], 89 yrs. old  GENDER:  female           ENDOSCOPIST:  Hedwig Morton. Juanda Chance, MD     Referred by:  Eugenio Hoes Tawanna Cooler, M.D.           PROCEDURE DATE:  12/14/2009     PROCEDURE:  EGD with biopsy     ASA CLASS:  Class III     INDICATIONS:  abdominal pain epigastric pain, worse with eating,     and when sitting up, kyphosis,           MEDICATIONS:   Versed 2 mg, Fentanyl 25 mcg     TOPICAL ANESTHETIC:  Exactacain Spray           DESCRIPTION OF PROCEDURE:   After the risks benefits and     alternatives of the procedure were thoroughly explained, informed     consent was obtained.  The LB GIF-H180 [id number] endoscope was     introduced through the mouth and advanced to the second portion of     the duodenum, without limitations.  The instrument was slowly     withdrawn as the mucosa was fully examined.     <<PROCEDUREIMAGES>>           The esophagus and gastroesophageal junction were completely normal     in appearance (see image1). mildly tortuous esophagus  irregular     Z-line (see image8, image7, and image2). <1cm long tongue of     gastric mucosa extending proximally  Otherwise the examination was     normal. r/o H.Pylori With standard forceps, a biopsy was obtained     and sent to pathology (see image4, image5, image6, and image3).     Retroflexed views revealed no abnormalities.    The scope was then     withdrawn from the patient and the procedure completed.           COMPLICATIONS:  None           ENDOSCOPIC IMPRESSION:     1) Normal esophagus     2) Irregular Z-line     3) Otherwise normal examination     nothing  to account for the abdominal pain,     RECOMMENDATIONS:     1) await biopsy results     upper abdominal ultrasound scheduled     samples of Aciphex given x 10 days     Phenergan elixir 6.25 mh prn nausea           REPEAT EXAM:  In 0 year(s) for.           ______________________________     Hedwig Morton. Juanda Chance, MD           CC:           n.     eSIGNED:   Hedwig Morton. Jaunice Mirza at 12/14/2009 04:15 PM           Kerry Kass,  [id number]  Note: An exclamation mark (!) indicates a result that was not dispersed into the flowsheet. Document Creation Date: 12/14/2009 4:16 PM _______________________________________________________________________  (1) Order result status: Final Collection or observation date-time: 12/14/2009 16:05 Requested date-time:  Receipt date-time:  Reported date-time:  Referring Physician:   Ordering Physician: Lina Sar [phone]) Specimen Source:  Source: Launa Grill Order Number: [id number] Lab site:

## 2011-01-20 LAB — CBC
Hemoglobin: 13.7 g/dL (ref 12.0–15.0)
MCHC: 34.9 g/dL (ref 30.0–36.0)
Platelets: 206 10*3/uL (ref 150–400)
RDW: 13.1 % (ref 11.5–15.5)

## 2011-01-20 LAB — URINALYSIS, ROUTINE W REFLEX MICROSCOPIC
Ketones, ur: NEGATIVE mg/dL
Leukocytes, UA: NEGATIVE
Protein, ur: NEGATIVE mg/dL
Urobilinogen, UA: 0.2 mg/dL (ref 0.0–1.0)

## 2011-01-20 LAB — DIFFERENTIAL
Lymphocytes Relative: 13 % (ref 12–46)
Lymphs Abs: 0.6 10*3/uL — ABNORMAL LOW (ref 0.7–4.0)
Monocytes Relative: 7 % (ref 3–12)
Neutro Abs: 3.8 10*3/uL (ref 1.7–7.7)
Neutrophils Relative %: 79 % — ABNORMAL HIGH (ref 43–77)

## 2011-01-20 LAB — COMPREHENSIVE METABOLIC PANEL
Albumin: 3.5 g/dL (ref 3.5–5.2)
Alkaline Phosphatase: 69 U/L (ref 39–117)
BUN: 9 mg/dL (ref 6–23)
CO2: 27 mEq/L (ref 19–32)
Chloride: 103 mEq/L (ref 96–112)
GFR calc Af Amer: >60 mL/min (ref >60)
GFR calc non Af Amer: 55 mL/min — ABNORMAL LOW (ref >60)
Glucose, Bld: 113 mg/dL — ABNORMAL HIGH (ref 70–99)
Potassium: 4 mEq/L (ref 3.5–5.1)
Total Bilirubin: 0.5 mg/dL (ref 0.3–1.2)

## 2011-01-20 LAB — URINE CULTURE

## 2011-01-20 LAB — URINE MICROSCOPIC-ADD ON

## 2011-01-20 LAB — POCT CARDIAC MARKERS
CKMB, poc: 1.1 ng/mL (ref 1.0–8.0)
Troponin i, poc: <0.05 ng/mL (ref 0.00–0.09)
Troponin i, poc: <0.05 ng/mL (ref 0.00–0.09)

## 2011-01-20 LAB — PROTIME-INR: INR: 1.04 (ref 0.00–1.49)

## 2011-01-24 LAB — DIFFERENTIAL
Basophils Absolute: 0 K/uL (ref 0.0–0.1)
Basophils Relative: 1 % (ref 0–1)
Eosinophils Absolute: 0 K/uL (ref 0.0–0.7)
Eosinophils Relative: 1 % (ref 0–5)
Lymphocytes Relative: 19 % (ref 12–46)
Lymphs Abs: 1 10*3/uL (ref 0.7–4.0)
Monocytes Absolute: 0.4 K/uL (ref 0.1–1.0)
Monocytes Relative: 8 % (ref 3–12)
Neutro Abs: 3.7 K/uL (ref 1.7–7.7)
Neutrophils Relative %: 72 % (ref 43–77)

## 2011-01-24 LAB — BASIC METABOLIC PANEL
BUN: 14 mg/dL (ref 6–23)
CO2: 24 mEq/L (ref 19–32)
Chloride: 108 mEq/L (ref 96–112)
GFR calc Af Amer: >60 mL/min (ref >60)
Glucose, Bld: 113 mg/dL — ABNORMAL HIGH (ref 70–99)
Potassium: 4.4 mEq/L (ref 3.5–5.1)

## 2011-01-24 LAB — CBC
HCT: 42 % (ref 36.0–46.0)
Hemoglobin: 14.3 g/dL (ref 12.0–15.0)
MCHC: 34.1 g/dL (ref 30.0–36.0)
MCV: 93.5 fL (ref 78.0–100.0)
Platelets: 211 10*3/uL (ref 150–400)
RBC: 4.49 MIL/uL (ref 3.87–5.11)
RDW: 13.1 % (ref 11.5–15.5)
WBC: 5.2 10*3/uL (ref 4.0–10.5)

## 2011-01-24 LAB — BASIC METABOLIC PANEL WITH GFR
Calcium: 9.2 mg/dL (ref 8.4–10.5)
Creatinine, Ser: 0.84 mg/dL (ref 0.4–1.2)
GFR calc non Af Amer: >60 mL/min (ref >60)
Sodium: 141 meq/L (ref 135–145)

## 2011-01-24 LAB — URINALYSIS, ROUTINE W REFLEX MICROSCOPIC
Bilirubin Urine: NEGATIVE
Glucose, UA: NEGATIVE mg/dL
Ketones, ur: NEGATIVE mg/dL
Leukocytes, UA: NEGATIVE
Nitrite: NEGATIVE
Protein, ur: NEGATIVE mg/dL
Specific Gravity, Urine: 1.009 (ref 1.005–1.030)
Urobilinogen, UA: 0.2 mg/dL (ref 0.0–1.0)
pH: 7.5 (ref 5.0–8.0)

## 2011-01-24 LAB — HEMOCCULT GUIAC POC 1CARD (OFFICE): Fecal Occult Bld: NEGATIVE

## 2011-01-24 LAB — URINE MICROSCOPIC-ADD ON

## 2011-01-24 LAB — POCT CARDIAC MARKERS
CKMB, poc: <1 ng/mL — ABNORMAL LOW (ref 1.0–8.0)
Myoglobin, poc: 82.8 ng/mL (ref 12–200)
Troponin i, poc: <0.05 ng/mL (ref 0.00–0.09)

## 2011-02-25 ENCOUNTER — Encounter: Payer: Self-pay | Admitting: Family Medicine

## 2011-02-26 ENCOUNTER — Ambulatory Visit (INDEPENDENT_AMBULATORY_CARE_PROVIDER_SITE_OTHER): Payer: Medicare Other | Admitting: Family Medicine

## 2011-02-26 ENCOUNTER — Encounter: Payer: Self-pay | Admitting: Family Medicine

## 2011-02-26 VITALS — BP 120/80 | Temp 98.1°F | Ht 60.0 in | Wt 106.0 lb

## 2011-02-26 DIAGNOSIS — N390 Urinary tract infection, site not specified: Secondary | ICD-10-CM

## 2011-02-26 LAB — POCT URINALYSIS DIPSTICK
Ketones, UA: NEGATIVE
Leukocytes, UA: NEGATIVE
Protein, UA: NEGATIVE
Urobilinogen, UA: 0.2

## 2011-02-26 NOTE — Progress Notes (Signed)
  Subjective:    Patient ID: Deborah Gilbert, female    DOB: Dec 11, 1919, 77 y.o.   MRN: [id number]  HPIWilma is 77 year old female, extremely hard of hearing, who comes in today for evaluation of urinary frequency and dysuria.  She states that 5 days ago she began having symptoms of dysuria.  She drank lots of water in her symptoms when a wide however, she would like her urine checked today    Review of Systems    In general, and neurologic review of systems otherwise negative Objective:   Physical Exam    Well-developed well-nourished, female, extremely hard of hearing.  No acute distress.  Abdomen negative    Assessment & Plan:  UTI treated with lots of water, now asymptomatic

## 2011-02-26 NOTE — Patient Instructions (Signed)
Drink for 8 ounces glasses of water daily.  Return care

## 2011-03-01 ENCOUNTER — Ambulatory Visit: Payer: Medicare Other | Admitting: Family Medicine

## 2011-03-01 ENCOUNTER — Encounter: Payer: Self-pay | Admitting: Internal Medicine

## 2011-03-01 ENCOUNTER — Ambulatory Visit (INDEPENDENT_AMBULATORY_CARE_PROVIDER_SITE_OTHER): Payer: Medicare Other | Admitting: Internal Medicine

## 2011-03-01 VITALS — BP 120/70 | HR 84 | Temp 97.9°F | Wt 108.0 lb

## 2011-03-01 DIAGNOSIS — R300 Dysuria: Secondary | ICD-10-CM

## 2011-03-01 DIAGNOSIS — N390 Urinary tract infection, site not specified: Secondary | ICD-10-CM

## 2011-03-01 LAB — POCT URINALYSIS DIPSTICK
Nitrite, UA: NEGATIVE
pH, UA: 5

## 2011-03-01 MED ORDER — CIPROFLOXACIN HCL 500 MG PO TABS: 500.0000 mg | ORAL_TABLET | Freq: Two times a day (BID) | ORAL | Status: AC

## 2011-03-01 NOTE — Progress Notes (Signed)
  Subjective:    Patient ID: Deborah Gilbert, female    DOB: 09/05/1920, 77 y.o.   MRN: [id number]  HPI  77 year old patient who presents with a one-week history of intermittent burning dysuria urgency. She was seen 3 days ago and UA was negative symptoms have waxed and waned. She complains of burning dysuria with frequency today the UA was reviewed and revealed trace leukocytes and small amount of blood.    Review of Systems  Genitourinary: Positive for dysuria, urgency, frequency, decreased urine volume and difficulty urinating. Negative for hematuria, flank pain, vaginal bleeding, vaginal discharge, vaginal pain and pelvic pain.       Objective:   Physical Exam  Constitutional: She appears well-developed and well-nourished. No distress.  Abdominal: Soft. Bowel sounds are normal.          Assessment & Plan:   Intermittent dysuria with trace pyuria. We'll empirically treat with Cipro for 5 days. If symptoms persist will consider an empiric trial of Premarin vaginal cream

## 2011-03-01 NOTE — Patient Instructions (Signed)
Antibiotic therapy was prescribed for the child due to specific medical indications.  Drink as much fluid as you  can tolerate over the next few days  Call or return to clinic prn if these symptoms worsen or fail to improve as anticipated.

## 2011-03-11 ENCOUNTER — Telehealth: Payer: Self-pay | Admitting: Family Medicine

## 2011-03-11 MED ORDER — ESTROGENS, CONJUGATED 0.625 MG/GM VA CREA: TOPICAL_CREAM | VAGINAL | Status: DC

## 2011-03-11 NOTE — Telephone Encounter (Signed)
Pt came in to see Dr Amador Cunas on 03/01/11 for bladder inf. Pt is still uncofortable and Dr Amador Cunas suggested that she might have a vaginal dryness and that pt could call in and ask for a cream to help with this. Pls call in cream to [facility] on [address] so that med could be delivered by pharmacy.

## 2011-03-11 NOTE — Telephone Encounter (Signed)
Addended by: Kern Reap on: 03/11/2011 05:35 PM   Modules accepted: Orders

## 2011-03-11 NOTE — Telephone Encounter (Signed)
Premarin vaginal cream,,,,,,,,, dispensed two tubes,,,,,,, directions apply small amounts 3 times weekly,,,,,,, if symptoms persist. ,,,,,,,,,,,, to the urologist

## 2011-03-14 ENCOUNTER — Other Ambulatory Visit: Payer: Self-pay | Admitting: Family Medicine

## 2011-03-14 ENCOUNTER — Ambulatory Visit: Payer: Medicare Other | Admitting: Family Medicine

## 2011-03-14 DIAGNOSIS — R300 Dysuria: Secondary | ICD-10-CM

## 2011-03-14 DIAGNOSIS — N390 Urinary tract infection, site not specified: Secondary | ICD-10-CM

## 2011-03-19 NOTE — Assessment & Plan Note (Signed)
[facility]                                 ON-CALL NOTE   Deborah Gilbert, Deborah Gilbert                      MRN:          [id number]  DATE:12/17/2009                            DOB:          Mar 19, 1920    This is a patient Dr. Lina Sar.   Deborah Gilbert called today at around noon.  She had an upper  endoscopy 5 days ago and it sounds as if it was fairly normal.  She  tolerated the procedure well and went home and has felt well ever since  then.  She actually felt stronger than her usual yesterday.  Today, she  woke up feeling very weak.  She has no abdominal pains, no nausea, no  vomiting.  She tolerated breakfast well.  She has had no overt bleeding.  She is clearly elderly, she sounds somewhat confused on the phone, she  has no chest pains or shortness of breath, but just keeps telling me she  is weak and she needs help.  I recommended that she hang up the phone  and dial 911.  It is not clear to me that she had any complication from  her routine upper endoscopy.     Rachael Fee, MD     DPJ/MedQ  DD: 12/17/2009  DT: 12/18/2009  Job #: [id number]

## 2011-07-05 ENCOUNTER — Other Ambulatory Visit: Payer: Self-pay | Admitting: Dermatology

## 2011-08-01 ENCOUNTER — Ambulatory Visit (INDEPENDENT_AMBULATORY_CARE_PROVIDER_SITE_OTHER): Payer: Medicare Other | Admitting: Family Medicine

## 2011-08-01 ENCOUNTER — Encounter: Payer: Self-pay | Admitting: Family Medicine

## 2011-08-01 VITALS — BP 130/70 | Temp 97.8°F | Wt 102.0 lb

## 2011-08-01 DIAGNOSIS — R42 Dizziness and giddiness: Secondary | ICD-10-CM

## 2011-08-01 NOTE — Patient Instructions (Signed)
We will call you next week with the report on your blood work.  [facility], Dr. Jenne Pane for a consult

## 2011-08-01 NOTE — Progress Notes (Signed)
  Subjective:    Patient ID: Deborah Gilbert, female    DOB: 1919-12-20, 77 y.o.   MRN: [id number]  HPI Deborah Gilbert is 77 year old single female, who resides at a retirement center, who comes in today for evaluation of vertigo.  It is extremely difficult to get a coherent history.  The best I can tell.  She has a sensation of rocking that comes and goes.  She feels better.  She lies flat.  She also feels better at night.  She, states she's read in a medical book that there are causes for this can be carried.  She also states that her sister had the same problem.  It took Antivert and vertigo went away.  She has seen Dr. Jenne Pane in the past.  Her ear, nose, and throat specialist for other reasons, and would like to see him again.  She also states the electrolyte abnormalities can cause vertigo, and she would like some blood tests done   Review of Systems    General ENT.  Neurologic review of systems otherwise negative Objective:   Physical Exam  Well-developed well-nourished, female, in no acute distress.  Neurologic exam normal.  Walks with a 4 .walk or      Assessment & Plan:

## 2011-08-02 LAB — CBC WITH DIFFERENTIAL/PLATELET
Basophils Relative: 0.3 % (ref 0.0–3.0)
Eosinophils Relative: 1.3 % (ref 0.0–5.0)
Lymphocytes Relative: 22.4 % (ref 12.0–46.0)
Monocytes Absolute: 0.3 10*3/uL (ref 0.1–1.0)
Monocytes Relative: 5.7 % (ref 3.0–12.0)
Neutrophils Relative %: 70.3 % (ref 43.0–77.0)
Platelets: 187 10*3/uL (ref 150.0–400.0)
RBC: 4.2 Mil/uL (ref 3.87–5.11)
WBC: 4.9 10*3/uL (ref 4.5–10.5)

## 2011-08-02 LAB — BASIC METABOLIC PANEL
BUN: 23 mg/dL (ref 6–23)
Calcium: 9.1 mg/dL (ref 8.4–10.5)
Creatinine, Ser: 0.8 mg/dL (ref 0.4–1.2)
GFR: 71.78 mL/min (ref >60.00)

## 2011-08-02 LAB — VITAMIN B12: Vitamin B-12: 586 pg/mL (ref 211–911)

## 2011-10-16 ENCOUNTER — Other Ambulatory Visit: Payer: Self-pay | Admitting: Neurology

## 2011-10-16 DIAGNOSIS — R269 Unspecified abnormalities of gait and mobility: Secondary | ICD-10-CM

## 2011-10-16 DIAGNOSIS — R42 Dizziness and giddiness: Secondary | ICD-10-CM

## 2012-01-22 ENCOUNTER — Ambulatory Visit (INDEPENDENT_AMBULATORY_CARE_PROVIDER_SITE_OTHER)
Admission: RE | Admit: 2012-01-22 | Discharge: 2012-01-22 | Disposition: A | Discharge status: Home / Self Care | Payer: Medicare Other | Source: Ambulatory Visit | Attending: Family | Admitting: Family

## 2012-01-22 ENCOUNTER — Encounter: Payer: Self-pay | Admitting: Family

## 2012-01-22 ENCOUNTER — Telehealth: Payer: Self-pay | Admitting: Family Medicine

## 2012-01-22 ENCOUNTER — Ambulatory Visit (INDEPENDENT_AMBULATORY_CARE_PROVIDER_SITE_OTHER): Payer: Medicare Other | Admitting: Family

## 2012-01-22 VITALS — BP 132/60 | Temp 98.7°F | Wt 97.0 lb

## 2012-01-22 DIAGNOSIS — R0602 Shortness of breath: Secondary | ICD-10-CM

## 2012-01-22 DIAGNOSIS — H538 Other visual disturbances: Secondary | ICD-10-CM

## 2012-01-22 DIAGNOSIS — R52 Pain, unspecified: Secondary | ICD-10-CM

## 2012-01-22 DIAGNOSIS — M546 Pain in thoracic spine: Secondary | ICD-10-CM

## 2012-01-22 DIAGNOSIS — R0789 Other chest pain: Secondary | ICD-10-CM

## 2012-01-22 LAB — POCT URINALYSIS DIPSTICK
Glucose, UA: NEGATIVE
Nitrite, UA: NEGATIVE
pH, UA: 5.5

## 2012-01-22 LAB — CBC WITH DIFFERENTIAL/PLATELET
Basophils Absolute: 0 K/uL (ref 0.0–0.1)
Basophils Relative: 0.4 % (ref 0.0–3.0)
Eosinophils Absolute: 0.1 K/uL (ref 0.0–0.7)
Eosinophils Relative: 1.4 % (ref 0.0–5.0)
HCT: 40.4 % (ref 36.0–46.0)
Hemoglobin: 13.5 g/dL (ref 12.0–15.0)
Lymphocytes Relative: 21.8 % (ref 12.0–46.0)
Lymphs Abs: 1.1 K/uL (ref 0.7–4.0)
MCHC: 33.5 g/dL (ref 30.0–36.0)
MCV: 93.2 fl (ref 78.0–100.0)
Monocytes Absolute: 0.5 K/uL (ref 0.1–1.0)
Monocytes Relative: 9.3 % (ref 3.0–12.0)
Neutro Abs: 3.5 K/uL (ref 1.4–7.7)
Neutrophils Relative %: 67.1 % (ref 43.0–77.0)
Platelets: 218 K/uL (ref 150.0–400.0)
RBC: 4.33 Mil/uL (ref 3.87–5.11)
RDW: 13.5 % (ref 11.5–14.6)
WBC: 5.2 K/uL (ref 4.5–10.5)

## 2012-01-22 LAB — BASIC METABOLIC PANEL WITH GFR
BUN: 24 mg/dL — ABNORMAL HIGH (ref 6–23)
CO2: 27 meq/L (ref 19–32)
Calcium: 9.6 mg/dL (ref 8.4–10.5)
Chloride: 103 meq/L (ref 96–112)
Creatinine, Ser: 1.6 mg/dL — ABNORMAL HIGH (ref 0.4–1.2)
GFR: 33.42 mL/min — ABNORMAL LOW
Glucose, Bld: 109 mg/dL — ABNORMAL HIGH (ref 70–99)
Potassium: 4.8 meq/L (ref 3.5–5.1)
Sodium: 137 meq/L (ref 135–145)

## 2012-01-22 LAB — TSH: TSH: 1.35 u[IU]/mL (ref 0.35–5.50)

## 2012-01-22 MED ORDER — TRAMADOL HCL 50 MG PO TABS: 50.0000 mg | ORAL_TABLET | Freq: Three times a day (TID) | ORAL | Status: AC | PRN

## 2012-01-22 NOTE — Patient Instructions (Signed)
1. Go to [facility] [address] to xrays of the chest and back today. 2. Tramadol sent to your pharmacy to help with the pain.  3. Patient to go to the Emergency Department if she develops chest pain.  Back Pain, Adult Low back pain is very common. About 1 in 5 people have back pain.The cause of low back pain is rarely dangerous. The pain often gets better over time.About half of people with a sudden onset of back pain feel better in just 2 weeks. About 8 in 10 people feel better by 6 weeks.  CAUSES Some common causes of back pain include:  Strain of the muscles or ligaments supporting the spine.   Wear and tear (degeneration) of the spinal discs.   Arthritis.   Direct injury to the back.  DIAGNOSIS Most of the time, the direct cause of low back pain is not known.However, back pain can be treated effectively even when the exact cause of the pain is unknown.Answering your caregiver's questions about your overall health and symptoms is one of the most accurate ways to make sure the cause of your pain is not dangerous. If your caregiver needs more information, he or she may order lab work or imaging tests (X-rays or MRIs).However, even if imaging tests show changes in your back, this usually does not require surgery. HOME CARE INSTRUCTIONS For many people, back pain returns.Since low back pain is rarely dangerous, it is often a condition that people can learn to [facility] their own.   Remain active. It is stressful on the back to sit or stand in one place. Do not sit, drive, or stand in one place for more than 30 minutes at a time. Take short walks on level surfaces as soon as pain allows.Try to increase the length of time you walk each day.   Do not stay in bed.Resting more than 1 or 2 days can delay your recovery.   Do not avoid exercise or work.Your body is made to move.It is not dangerous to be active, even though your back may hurt.Your back will likely heal faster if you return to  being active before your pain is gone.   Pay attention to your body when you bend and lift. Many people have less discomfortwhen lifting if they bend their knees, keep the load close to their bodies,and avoid twisting. Often, the most comfortable positions are those that put less stress on your recovering back.   Find a comfortable position to sleep. Use a firm mattress and lie on your side with your knees slightly bent. If you lie on your back, put a pillow under your knees.   Only take over-the-counter or prescription medicines as directed by your caregiver. Over-the-counter medicines to reduce pain and inflammation are often the most helpful.Your caregiver may prescribe muscle relaxant drugs.These medicines help dull your pain so you can more quickly return to your normal activities and healthy exercise.   Put ice on the injured area.   Put ice in a plastic bag.   Place a towel between your skin and the bag.   Leave the ice on for 15 to 20 minutes, 3 to 4 times a day for the first 2 to 3 days. After that, ice and heat may be alternated to reduce pain and spasms.   Ask your caregiver about trying back exercises and gentle massage. This may be of some benefit.   Avoid feeling anxious or stressed.Stress increases muscle tension and can worsen back pain.It is  important to recognize when you are anxious or stressed and learn ways to manage it.Exercise is a great option.  SEEK MEDICAL CARE IF:  You have pain that is not relieved with rest or medicine.   You have pain that does not improve in 1 week.   You have new symptoms.   You are generally not feeling well.  SEEK IMMEDIATE MEDICAL CARE IF:   You have pain that radiates from your back into your legs.   You develop new bowel or bladder control problems.   You have unusual weakness or numbness in your arms or legs.   You develop nausea or vomiting.   You develop abdominal pain.   You feel faint.  Document Released:  10/21/2005 Document Revised: 10/10/2011 Document Reviewed: 03/11/2011 [facility] Patient Information 2012 [place], [place].  Dizziness Dizziness is a common problem. It is a feeling of unsteadiness or lightheadedness. You may feel like you are about to faint. Dizziness can lead to injury if you stumble or fall. A person of any age group can suffer from dizziness, but dizziness is more common in older adults. CAUSES  Dizziness can be caused by many different things, including:  Middle ear problems.   Standing for too long.   Infections.   An allergic reaction.   Aging.   An emotional response to something, such as the sight of blood.   Side effects of medicines.   Fatigue.   Problems with circulation or blood pressure.   Excess use of alcohol, medicines, or illegal drug use.   Breathing too fast (hyperventilation).   An arrhythmia or problems with your heart rhythm.   Low red blood cell count (anemia).   Pregnancy.   Vomiting, diarrhea, fever, or other illnesses that cause dehydration.   Diseases or conditions such as Parkinson's disease, high blood pressure (hypertension), diabetes, and thyroid problems.   Exposure to extreme heat.  DIAGNOSIS  To find the cause of your dizziness, your caregiver may do a physical exam, lab tests, radiologic imaging scans, or an electrocardiography test (ECG).  TREATMENT  Treatment of dizziness depends on the cause of your symptoms and can vary greatly. HOME CARE INSTRUCTIONS   Drink enough fluids to keep your urine clear or pale yellow. This is especially important in very hot weather. In the elderly, it is also important in cold weather.   If your dizziness is caused by medicines, take them exactly as directed. When taking blood pressure medicines, it is especially important to get up slowly.   Rise slowly from chairs and steady yourself until you feel okay.   In the morning, first sit up on the side of the bed. When this seems okay,  stand slowly while holding onto something until you know your balance is fine.   If you need to stand in one place for a long time, be sure to move your legs often. Tighten and relax the muscles in your legs while standing.   If dizziness continues to be a problem, have someone stay with you for a day or two. Do this until you feel you are well enough to stay alone. Have the person call your caregiver if he or she notices changes in you that are concerning.   Do not drive or use heavy machinery if you feel dizzy.  SEEK IMMEDIATE MEDICAL CARE IF:   Your dizziness or lightheadedness gets worse.   You feel nauseous or vomit.   You develop problems with talking, walking, weakness, or using your  arms, hands, or legs.   You are not thinking clearly or you have difficulty forming sentences. It may take a friend or family member to determine if your thinking is normal.   You develop chest pain, abdominal pain, shortness of breath, or sweating.   Your vision changes.   You notice any bleeding.   You have side effects from medicine that seems to be getting worse rather than better.  MAKE SURE YOU:   Understand these instructions.   Will watch your condition.   Will get help right away if you are not doing well or get worse.  Document Released: 04/16/2001 Document Revised: 10/10/2011 Document Reviewed: 05/10/2011 [facility] Patient Information 2012 [place], [place].

## 2012-01-22 NOTE — Telephone Encounter (Signed)
Opened in error

## 2012-01-22 NOTE — Progress Notes (Signed)
Subjective:    Patient ID: Deborah Gilbert, female    DOB: 1920/08/04, 78 y.o.   MRN: [id number]  HPI 78 year old white female, patient of Dr. Tawanna Cooler is in with complaints of dizziness, shortness of breath, upper back pain and blurred vision. She is a very poor historian and hard of hearing. Reports the dizziness has been going on for about 2 years has previously seen neurology has been prescribed meclizine and diazepam that made her sick. She describes chest soreness x 3 days, unable to characterize the intensity of the pain. She does say that it comes and goes. Does not currently have any pain. And is unsure how long it lasts. Patient is not on anything in particular when the chest soreness occurs. She reports shortness of breath but no particular event.   Patient reports upper back pain bilaterally. That's worse in the morning better as the day progresses. Reports the pain prevents her being able to make up her bed and thinks her breakfast in the morning. She denies any numbness or tingling. Is unable to characterize or rate the pain.  Meds unknown, patient reports recently being treated for a UTI with Bactrim. However, I don't see any documentation of that.   Review of Systems  Constitutional: Negative.        Poor historian: hard of hearing  HENT: Negative.   Respiratory: Positive for shortness of breath.   Cardiovascular: Negative.        Chest "soreness" at times  Gastrointestinal: Negative.   Genitourinary: Negative.   Musculoskeletal: Negative.   Skin: Negative.   Neurological: Positive for dizziness.       Dizzy x 2 years  Hematological: Negative.   Psychiatric/Behavioral: Negative.    Past Medical History  Diagnosis Date  . Painful respiration   . Effusion of lower leg joint   . Dysuria   . Family history of malignant neoplasm of gastrointestinal tract   . Diarrhea   . Painful respiration   . Effusion of lower leg joint     History   Social History  . Marital  Status: Single    Spouse Name: N/A    Number of Children: N/A  . Years of Education: N/A   Occupational History  . Not on file.   Social History Main Topics  . Smoking status: Never Smoker   . Smokeless tobacco: Not on file  . Alcohol Use: No  . Drug Use: No  . Sexually Active:    Other Topics Concern  . Not on file   Social History Narrative  . No narrative on file    Past Surgical History  Procedure Date  . Appendectomy   . Hip sx-right 1993  . Child birth   . Catract surgery/bilateral     No family history on file.  Allergies  Allergen Reactions  . Famotidine     REACTION: "felt like a heart attack"  . Tetracycline     REACTION: Severe nausea and vomiting    No current outpatient prescriptions on file prior to visit.    BP 132/60  Temp(Src) 98.7 F (37.1 C) (Oral)  Wt 97 lb (43.999 kg)  SpO2 97%chart    Objective:   Physical Exam  Constitutional: She is oriented to person, place, and time. She appears well-developed and well-nourished.  HENT:  Right Ear: External ear normal.  Nose: Nose normal.  Mouth/Throat: Oropharynx is clear and moist.  Cardiovascular: Normal rate, regular rhythm and normal heart sounds.   Pulmonary/Chest: Effort  normal and breath sounds normal. She has no wheezes.  Abdominal: Soft. Bowel sounds are normal.  Musculoskeletal: Normal range of motion.       Walks with a walker  Neurological: She is alert and oriented to person, place, and time.  Skin: Skin is warm and dry.  Psychiatric: She has a normal mood and affect.      UA was normal. EKG within normal limits, no acute abnormality.    Assessment & Plan:  Assessment: Chest discomfort, shortness of breath, dizziness, back pain-thoracic  Plan: Labs sent to include BMP, CBC, TSH. Will obtain x-rays of her thoracic spine and a chest x-ray. Will notify patient pending results. Encouraged to go to the emergency department if her symptoms worsen. Tramadol 50 mg 1 tab every 8  hours when necessary pain.

## 2012-01-24 ENCOUNTER — Telehealth: Payer: Self-pay | Admitting: Family Medicine

## 2012-01-24 NOTE — Telephone Encounter (Signed)
Deborah Gilbert had questions regarding labs results. They were given to pt yesterday but she did not understand them. Deborah Gilbert (DPR) was calling back to clarify. Please contact

## 2012-01-24 NOTE — Telephone Encounter (Signed)
Spoke with [name]. She wanted to verify mg of calcium and units of vit d. Advised her that it should be 1500mg  of calcium and 800units of vit d. Deborah Gilbert verbalized understanding. She also advised that we always speak to her because pt is hard of hearing and is beginning to have memory problems

## 2012-02-10 ENCOUNTER — Telehealth: Payer: Self-pay | Admitting: Family Medicine

## 2012-02-10 NOTE — Telephone Encounter (Signed)
Pt is req to be able to take her own vitamins, and needs approval from pcp to be able to have her vitamins in her room at nursing home. Need to get written auth.

## 2012-02-10 NOTE — Telephone Encounter (Signed)
Please call vitamins are not necessary or indicated

## 2012-02-10 NOTE — Telephone Encounter (Signed)
Spoke with Ms Deborah Gilbert and she understood.  However Ms Deborah Gilbert insist that she take her OTC medication.  A written Rx was sent to Ms Deborah Gilbert.

## 2012-02-19 ENCOUNTER — Ambulatory Visit (INDEPENDENT_AMBULATORY_CARE_PROVIDER_SITE_OTHER): Payer: Medicare Other | Admitting: Family

## 2012-02-19 ENCOUNTER — Encounter: Payer: Self-pay | Admitting: Family

## 2012-02-19 VITALS — BP 110/70 | Temp 97.8°F | Wt 98.0 lb

## 2012-02-19 DIAGNOSIS — Z9181 History of falling: Secondary | ICD-10-CM

## 2012-02-19 DIAGNOSIS — Z111 Encounter for screening for respiratory tuberculosis: Secondary | ICD-10-CM

## 2012-02-19 DIAGNOSIS — R4182 Altered mental status, unspecified: Secondary | ICD-10-CM

## 2012-02-19 NOTE — Progress Notes (Signed)
Subjective:    Patient ID: Deborah Gilbert, female    DOB: Dec 13, 1919, 78 y.o.   MRN: [id number]  HPI Comments: 78 yo white female presents with POA. POA stating pt lives in [place] and has been hallucinating at night x six weeks. Pt stating she is seeing spirits that glide through the walls and doors in her room at night. C/o fatigue and insomnia. Oriented to person, place, time, and event. Assisted living placement pending. Recently treated with Septra. C/o "itching all over." Denies rash or other skin lesion.      Review of Systems  Constitutional: Positive for fatigue. Negative for fever, chills, diaphoresis, activity change, appetite change and unexpected weight change.  HENT: Negative.   Eyes: Negative.   Respiratory: Negative.   Cardiovascular: Negative.   Gastrointestinal: Negative.   Genitourinary: Negative for dysuria, urgency, frequency, hematuria, flank pain, decreased urine volume, enuresis, difficulty urinating, pelvic pain and dyspareunia.  Musculoskeletal: Negative.   Neurological: Negative.   Psychiatric/Behavioral: Positive for hallucinations and sleep disturbance. Negative for suicidal ideas, behavioral problems, confusion, self-injury, dysphoric mood, decreased concentration and agitation. The patient is not nervous/anxious.    Past Medical History  Diagnosis Date  . Painful respiration   . Effusion of lower leg joint   . Dysuria   . Family history of malignant neoplasm of gastrointestinal tract   . Diarrhea   . Painful respiration   . Effusion of lower leg joint     History   Social History  . Marital Status: Single    Spouse Name: N/A    Number of Children: N/A  . Years of Education: N/A   Occupational History  . Not on file.   Social History Main Topics  . Smoking status: Never Smoker   . Smokeless tobacco: Not on file  . Alcohol Use: No  . Drug Use: No  . Sexually Active:    Other Topics Concern  . Not on file   Social History  Narrative  . No narrative on file    Past Surgical History  Procedure Date  . Appendectomy   . Hip sx-right 1993  . Child birth   . Catract surgery/bilateral     No family history on file.  Allergies  Allergen Reactions  . Famotidine     REACTION: "felt like a heart attack"  . Tetracycline     REACTION: Severe nausea and vomiting    No current outpatient prescriptions on file prior to visit.    BP 110/70  Temp(Src) 97.8 F (36.6 C) (Oral)  Wt 98 lb (44.453 kg)chart     Objective:   Physical Exam  Constitutional: She is oriented to person, place, and time. She appears well-developed and well-nourished. No distress.  HENT:  Head: Normocephalic and atraumatic.  Right Ear: External ear normal.  Left Ear: External ear normal.  Mouth/Throat: Oropharynx is clear and moist.  Eyes: Conjunctivae are normal. Pupils are equal, round, and reactive to light. Right eye exhibits no discharge. Left eye exhibits no discharge.  Neck: Normal range of motion. Neck supple.  Cardiovascular: Normal rate, regular rhythm, normal heart sounds and intact distal pulses.  Exam reveals no gallop and no friction rub.   No murmur heard. Pulmonary/Chest: Effort normal and breath sounds normal. No respiratory distress. She has no wheezes. She has no rales. She exhibits no tenderness.  Abdominal: Soft. Bowel sounds are normal. She exhibits no distension and no mass. There is no tenderness. There is no rebound and no guarding.  Musculoskeletal: Normal range of motion.  Neurological: She is alert and oriented to person, place, and time.  Skin: Skin is warm and dry. She is not diaphoretic.          Assessment & Plan:  Assessment: Mental Status Change-non acute, Screening for assistant living placement  Plan: Increase po fluids. Unable to void for urinalysis. Teaching handouts on diagnosis and treatment provided. Encouraged RTC if s/s get worse Refused referral Neurology and Ct scan.

## 2012-02-20 ENCOUNTER — Other Ambulatory Visit: Payer: Self-pay | Admitting: Family

## 2012-02-20 ENCOUNTER — Telehealth: Payer: Self-pay | Admitting: Family Medicine

## 2012-02-20 MED ORDER — MECLIZINE HCL 25 MG PO TABS: 25.0000 mg | ORAL_TABLET | Freq: Three times a day (TID) | ORAL | Status: AC | PRN

## 2012-02-20 NOTE — Telephone Encounter (Signed)
Rx sent to pharmacy by [facility]

## 2012-02-20 NOTE — Telephone Encounter (Signed)
Patient's POA called stating that they were in on yesterday and was supposed to have an rx for meclozine called into [facility] and the pharmacy never received it. Please assist.

## 2012-02-21 LAB — TB SKIN TEST

## 2012-03-03 ENCOUNTER — Telehealth: Payer: Self-pay | Admitting: Family Medicine

## 2012-03-03 NOTE — Telephone Encounter (Signed)
[facility] Fax:[phone] Pt needs a letter faxed over stating that she can take the following OTC medication when needed Tylenol , imodium ad,and benadryl

## 2012-03-03 NOTE — Telephone Encounter (Signed)
Prescription faxed

## 2012-03-05 ENCOUNTER — Encounter: Payer: Self-pay | Admitting: Family Medicine

## 2012-04-14 ENCOUNTER — Other Ambulatory Visit: Payer: Self-pay | Admitting: *Deleted

## 2012-04-14 MED ORDER — CHOLECALCIFEROL 25 MCG (1000 UT) PO TABS: 1000.0000 [IU] | ORAL_TABLET | Freq: Every day | ORAL | Status: AC

## 2012-04-14 MED ORDER — CALCIUM CARBONATE-VITAMIN D 600-400 MG-UNIT PO TABS: 1.0000 | ORAL_TABLET | Freq: Every day | ORAL | Status: DC

## 2012-04-14 MED ORDER — LUTEIN 20 MG PO TABS: 20.0000 mg | ORAL_TABLET | Freq: Every day | ORAL | Status: DC

## 2012-04-14 MED ORDER — THERAGRAN-M ADVANCED 50 PLUS PO TABS: ORAL_TABLET | ORAL | Status: DC

## 2012-04-14 NOTE — Telephone Encounter (Signed)
rx clarification

## 2012-04-21 ENCOUNTER — Telehealth: Payer: Self-pay | Admitting: Family Medicine

## 2012-04-21 MED ORDER — BENZONATATE 100 MG PO CAPS: 100.0000 mg | ORAL_CAPSULE | Freq: Three times a day (TID) | ORAL | Status: AC | PRN

## 2012-04-21 NOTE — Telephone Encounter (Signed)
Pt is req to get call back from nurse asap or get work in ov with Dr Tawanna Cooler. Pt has laryngitis and low grade fever. Pls call asap.

## 2012-04-21 NOTE — Telephone Encounter (Signed)
Pt is experiencing cold/ allergie symptoms and consistent diarrhea. Pt would like to be seen today. Please contact [facility]- [address] [phone] to schedule appt

## 2012-04-21 NOTE — Telephone Encounter (Signed)
Deborah Gilbert please call,,,,,,,,, this not need an office visit for this minor problem,,,,,,,,, treat symptomatically with Tylenol and Tessalon Perles one 3 times daily when necessary for cough

## 2012-05-04 ENCOUNTER — Telehealth: Payer: Self-pay | Admitting: *Deleted

## 2012-05-04 NOTE — Telephone Encounter (Signed)
[facility] is calling because the patient is taking  Cipro for a UTI and it is causing diarrhea.  She has tried Keflex in the past but it also caused diarrhea.  She would like a new Rx if possible?

## 2012-05-04 NOTE — Telephone Encounter (Signed)
Discontinue Cipro

## 2012-05-04 NOTE — Telephone Encounter (Signed)
Discontinue Cipro. Observe off all antibiotics at this time;  we'll consider a urine for C&S and alternate antibiotics in the future if necessary

## 2012-05-05 NOTE — Telephone Encounter (Signed)
Order faxed to [facility]

## 2012-06-02 ENCOUNTER — Ambulatory Visit: Payer: Medicare Other | Admitting: Family Medicine

## 2014-03-24 ENCOUNTER — Ambulatory Visit: Payer: Self-pay | Admitting: Podiatry

## 2014-03-30 ENCOUNTER — Emergency Department (HOSPITAL_COMMUNITY)
Admission: EM | Admit: 2014-03-30 | Discharge: 2014-03-30 | Disposition: A | Discharge status: Skilled Nursing Facility | Payer: Medicare Other | Attending: Emergency Medicine | Admitting: Emergency Medicine

## 2014-03-30 ENCOUNTER — Encounter (HOSPITAL_COMMUNITY): Payer: Self-pay | Admitting: Emergency Medicine

## 2014-03-30 ENCOUNTER — Emergency Department (HOSPITAL_COMMUNITY): Payer: Medicare Other

## 2014-03-30 DIAGNOSIS — R296 Repeated falls: Secondary | ICD-10-CM | POA: Insufficient documentation

## 2014-03-30 DIAGNOSIS — Y939 Activity, unspecified: Secondary | ICD-10-CM | POA: Insufficient documentation

## 2014-03-30 DIAGNOSIS — Z8739 Personal history of other diseases of the musculoskeletal system and connective tissue: Secondary | ICD-10-CM | POA: Insufficient documentation

## 2014-03-30 DIAGNOSIS — R42 Dizziness and giddiness: Secondary | ICD-10-CM | POA: Insufficient documentation

## 2014-03-30 DIAGNOSIS — I10 Essential (primary) hypertension: Secondary | ICD-10-CM | POA: Insufficient documentation

## 2014-03-30 DIAGNOSIS — S0990XA Unspecified injury of head, initial encounter: Secondary | ICD-10-CM | POA: Insufficient documentation

## 2014-03-30 DIAGNOSIS — Y9210 Unspecified residential institution as the place of occurrence of the external cause: Secondary | ICD-10-CM | POA: Insufficient documentation

## 2014-03-30 DIAGNOSIS — W19XXXA Unspecified fall, initial encounter: Secondary | ICD-10-CM

## 2014-03-30 HISTORY — DX: Unspecified hearing loss, unspecified ear: H91.90

## 2014-03-30 HISTORY — DX: Unspecified macular degeneration: H35.30

## 2014-03-30 HISTORY — DX: Unspecified osteoarthritis, unspecified site: M19.90

## 2014-03-30 LAB — COMPREHENSIVE METABOLIC PANEL
ALBUMIN: 2.7 g/dL — AB (ref 3.5–5.2)
ALT: 12 U/L (ref 0–35)
AST: 18 U/L (ref 0–37)
Alkaline Phosphatase: 52 U/L (ref 39–117)
BILIRUBIN TOTAL: 0.3 mg/dL (ref 0.3–1.2)
BUN: 25 mg/dL — ABNORMAL HIGH (ref 6–23)
CHLORIDE: 108 meq/L (ref 96–112)
CO2: 25 mEq/L (ref 19–32)
CREATININE: 0.82 mg/dL (ref 0.50–1.10)
Calcium: 8.7 mg/dL (ref 8.4–10.5)
GFR calc Af Amer: 65 mL/min — ABNORMAL LOW (ref >90)
GFR calc non Af Amer: 56 mL/min — ABNORMAL LOW (ref >90)
Glucose, Bld: 97 mg/dL (ref 70–99)
Potassium: 4.1 mEq/L (ref 3.7–5.3)
SODIUM: 144 meq/L (ref 137–147)
Total Protein: 5.2 g/dL — ABNORMAL LOW (ref 6.0–8.3)

## 2014-03-30 LAB — CBC WITH DIFFERENTIAL/PLATELET
Basophils Absolute: 0 10*3/uL (ref 0.0–0.1)
Basophils Relative: 1 % (ref 0–1)
EOS ABS: 0.1 10*3/uL (ref 0.0–0.7)
Eosinophils Relative: 2 % (ref 0–5)
HCT: 33.8 % — ABNORMAL LOW (ref 36.0–46.0)
Hemoglobin: 10.9 g/dL — ABNORMAL LOW (ref 12.0–15.0)
Lymphocytes Relative: 25 % (ref 12–46)
Lymphs Abs: 1.1 10*3/uL (ref 0.7–4.0)
MCH: 30.1 pg (ref 26.0–34.0)
MCHC: 32.2 g/dL (ref 30.0–36.0)
MCV: 93.4 fL (ref 78.0–100.0)
Monocytes Absolute: 0.5 10*3/uL (ref 0.1–1.0)
Monocytes Relative: 11 % (ref 3–12)
NEUTROS ABS: 2.7 10*3/uL (ref 1.7–7.7)
NEUTROS PCT: 61 % (ref 43–77)
PLATELETS: 148 10*3/uL — AB (ref 150–400)
RBC: 3.62 MIL/uL — ABNORMAL LOW (ref 3.87–5.11)
RDW: 15.3 % (ref 11.5–15.5)
WBC: 4.4 10*3/uL (ref 4.0–10.5)

## 2014-03-30 LAB — I-STAT TROPONIN, ED: TROPONIN I, POC: 0 ng/mL (ref 0.00–0.08)

## 2014-03-30 MED ORDER — SODIUM CHLORIDE 0.9 % IV BOLUS (SEPSIS)
500.0000 mL | Freq: Once | INTRAVENOUS | Status: AC
  Administered 2014-03-30: 500 mL via INTRAVENOUS

## 2014-03-30 NOTE — ED Notes (Signed)
Per EMS: Pt from [facility]. Pt states she was standing when she got dizzy and fell backwards. Pt denies any pain. Pt is oriented to self, place and situation but responds appropriately and follows commands.PERRLA.

## 2014-03-30 NOTE — Discharge Instructions (Signed)
Stay hydrated.   Continue taking meclizine for dizziness.   Follow up with your doctor   Return to ER if you have worse dizziness, fall.

## 2014-03-30 NOTE — ED Provider Notes (Signed)
CSN: [id number]     Arrival date & time 03/30/14  1750 History   First MD Initiated Contact with Patient 03/30/14 1754     Chief Complaint  Patient presents with  . Fall  . Dizziness     (Consider location/radiation/quality/duration/timing/severity/associated sxs/prior Treatment) The history is provided by the patient.  Deborah Gilbert is a 80 y.o. female hx of vertigo, HTN here with fall. She was standing and suddenly felt lightheaded and dizzy and fell backwards. Denies passing out or chest pain. Has some headache but denies being on anticoagulation. Has chronic diarrhea about once a day.   Level V caveat- dementia    Past Medical History  Diagnosis Date  . HOH (hard of hearing)   . Osteoarthritis   . Macular degeneration    Past Surgical History  Procedure Laterality Date  . Appendectomy     No family history on file. History  Substance Use Topics  . Smoking status: Not on file  . Smokeless tobacco: Not on file  . Alcohol Use: Not on file   OB History   Grav Para Term Preterm Abortions TAB SAB Ect Mult Living                 Review of Systems  Neurological: Positive for dizziness.  All other systems reviewed and are negative.     Allergies  Pepcid; Tetracyclines & related; and Trimethoprim  Home Medications   Prior to Admission medications   Not on File   BP 160/83  Pulse 69  Temp(Src) 97.9 F (36.6 C) (Oral)  Resp 20  SpO2 97% Physical Exam  Nursing note and vitals reviewed. Constitutional: She is oriented to person, place, and time.  Chronically ill, well appearing, younger than stated age   HENT:  Head: Normocephalic.  Mouth/Throat: Oropharynx is clear and moist.  ? Small posterior scalp hematoma   Eyes: Conjunctivae and EOM are normal. Pupils are equal, round, and reactive to light.  Neck: Normal range of motion. Neck supple.  Cardiovascular: Normal rate, regular rhythm and normal heart sounds.   Pulmonary/Chest: Effort normal and breath  sounds normal. No respiratory distress. She has no wheezes. She has no rales.  Abdominal: Soft. Bowel sounds are normal. She exhibits no distension. There is no tenderness. There is no rebound.  Musculoskeletal: Normal range of motion.  No obvious extremity trauma. Nl spinal tenderness. Nl ROM bilateral hips. Pelvis stable   Neurological: She is alert and oriented to person, place, and time. No cranial nerve deficit. Coordination normal.  Skin: Skin is warm and dry.  Psychiatric: She has a normal mood and affect. Her behavior is normal. Judgment and thought content normal.    ED Course  Procedures (including critical care time) Labs Review Labs Reviewed  CBC WITH DIFFERENTIAL - Abnormal; Notable for the following:    RBC 3.62 (*)    Hemoglobin 10.9 (*)    HCT 33.8 (*)    Platelets 148 (*)    All other components within normal limits  COMPREHENSIVE METABOLIC PANEL - Abnormal; Notable for the following:    BUN 25 (*)    Total Protein 5.2 (*)    Albumin 2.7 (*)    GFR calc non Af Amer 56 (*)    GFR calc Af Amer 65 (*)    All other components within normal limits  I-STAT TROPOININ, ED    Imaging Review Ct Head Wo Contrast  03/30/2014   CLINICAL DATA:  Fall  EXAM: CT HEAD WITHOUT CONTRAST  CT CERVICAL SPINE WITHOUT CONTRAST  TECHNIQUE: Multidetector CT imaging of the head and cervical spine was performed following the standard protocol without intravenous contrast. Multiplanar CT image reconstructions of the cervical spine were also generated.  COMPARISON:  None  FINDINGS: CT HEAD FINDINGS  Minimal atrophy.  Normal ventricular morphology.  No midline shift or mass effect.  Mildly prominent cisterna magna common normal variant.  Small old appearing RIGHT subfrontal infarct.  No intracranial hemorrhage, mass lesion or evidence acute infarction.  No extra-axial fluid collections.  Bones and sinuses unremarkable.  CT CERVICAL SPINE FINDINGS  Visualized skullbase intact.  Bones appear  demineralized.  Motion artifacts despite repeating images.  Multilevel facet degenerative changes.  Multilevel disc space narrowing and endplate spur formation.  Prevertebral soft tissues normal thickness.  Minimal anterolisthesis at C5-C6 likely due to degenerative disc and facet disease.  Minimal retrolisthesis at C3-C4.  No acute fracture or bone destruction.  Scarring at both lung apices.  Scattered atherosclerotic calcifications of the carotid systems.  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  No acute intracranial abnormalities.  Multilevel degenerative disc and facet disease changes of the cervical spine with degenerative listhesis at 2 levels.  No definite acute cervical spine abnormalities identified.   Electronically Signed   By: Ulyses SouthwardMark  Boles M.D.   On: 03/30/2014 18:56   Ct Cervical Spine Wo Contrast  03/30/2014   CLINICAL DATA:  Fall  EXAM: CT HEAD WITHOUT CONTRAST  CT CERVICAL SPINE WITHOUT CONTRAST  TECHNIQUE: Multidetector CT imaging of the head and cervical spine was performed following the standard protocol without intravenous contrast. Multiplanar CT image reconstructions of the cervical spine were also generated.  COMPARISON:  None  FINDINGS: CT HEAD FINDINGS  Minimal atrophy.  Normal ventricular morphology.  No midline shift or mass effect.  Mildly prominent cisterna magna common normal variant.  Small old appearing RIGHT subfrontal infarct.  No intracranial hemorrhage, mass lesion or evidence acute infarction.  No extra-axial fluid collections.  Bones and sinuses unremarkable.  CT CERVICAL SPINE FINDINGS  Visualized skullbase intact.  Bones appear demineralized.  Motion artifacts despite repeating images.  Multilevel facet degenerative changes.  Multilevel disc space narrowing and endplate spur formation.  Prevertebral soft tissues normal thickness.  Minimal anterolisthesis at C5-C6 likely due to degenerative disc and facet disease.  Minimal retrolisthesis at C3-C4.  No acute fracture  or bone destruction.  Scarring at both lung apices.  Scattered atherosclerotic calcifications of the carotid systems.  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  No acute intracranial abnormalities.  Multilevel degenerative disc and facet disease changes of the cervical spine with degenerative listhesis at 2 levels.  No definite acute cervical spine abnormalities identified.   Electronically Signed   By: Ulyses SouthwardMark  Boles M.D.   On: 03/30/2014 18:56     EKG Interpretation   Date/Time:  Wednesday Mar 30 2014 18:00:11 EDT Ventricular Rate:  74 PR Interval:  131 QRS Duration: 89 QT Interval:  412 QTC Calculation: 457 R Axis:   78 Text Interpretation:  Sinus rhythm No previous ECGs available Confirmed by  Negar Sieler  MD, Wauneta Silveria ([id number]) on 03/30/2014 6:02:50 PM      MDM   Final diagnoses:  None   Deborah Gilbert is a 80 y.o. female here with dizziness, fall. Has chronic diarrhea so will check electrolytes. Will do CT head to r/o bleed. I doubt stroke so will not need MRI if CT neg.    7:48 PM Not orthostatic, given IVF. CT showed no bleed. Will  d/c back.     Richardean Canal, MD 03/30/14 [phone]

## 2014-03-30 NOTE — ED Notes (Signed)
Attempted to call report to [facility] with no response. Family at bedside made aware that [facility] was contacted for pt's transport facility. Discussed discharge instructions with pt and family.

## 2014-03-31 ENCOUNTER — Encounter: Payer: Self-pay | Admitting: Family

## 2014-04-02 ENCOUNTER — Encounter (HOSPITAL_COMMUNITY): Payer: Self-pay | Admitting: Emergency Medicine

## 2014-04-02 ENCOUNTER — Emergency Department (HOSPITAL_COMMUNITY)
Admission: EM | Admit: 2014-04-02 | Discharge: 2014-04-02 | Disposition: A | Discharge status: Home / Self Care | Payer: Medicare Other | Attending: Emergency Medicine | Admitting: Emergency Medicine

## 2014-04-02 DIAGNOSIS — H9190 Unspecified hearing loss, unspecified ear: Secondary | ICD-10-CM | POA: Insufficient documentation

## 2014-04-02 DIAGNOSIS — Z9849 Cataract extraction status, unspecified eye: Secondary | ICD-10-CM | POA: Insufficient documentation

## 2014-04-02 DIAGNOSIS — R4182 Altered mental status, unspecified: Secondary | ICD-10-CM | POA: Insufficient documentation

## 2014-04-02 DIAGNOSIS — Z79899 Other long term (current) drug therapy: Secondary | ICD-10-CM | POA: Insufficient documentation

## 2014-04-02 DIAGNOSIS — M1990 Unspecified osteoarthritis, unspecified site: Secondary | ICD-10-CM | POA: Insufficient documentation

## 2014-04-02 DIAGNOSIS — N390 Urinary tract infection, site not specified: Secondary | ICD-10-CM | POA: Insufficient documentation

## 2014-04-02 LAB — BASIC METABOLIC PANEL
BUN: 25 mg/dL — ABNORMAL HIGH (ref 6–23)
CO2: 23 mEq/L (ref 19–32)
Calcium: 8.9 mg/dL (ref 8.4–10.5)
Chloride: 104 mEq/L (ref 96–112)
Creatinine, Ser: 0.67 mg/dL (ref 0.50–1.10)
GFR calc Af Amer: 86 mL/min — ABNORMAL LOW (ref >90)
GFR, EST NON AFRICAN AMERICAN: 75 mL/min — AB (ref >90)
Glucose, Bld: 98 mg/dL (ref 70–99)
Potassium: 3.6 mEq/L — ABNORMAL LOW (ref 3.7–5.3)
SODIUM: 138 meq/L (ref 137–147)

## 2014-04-02 LAB — CBC WITH DIFFERENTIAL/PLATELET
Basophils Absolute: 0 K/uL (ref 0.0–0.1)
Basophils Relative: 0 % (ref 0–1)
Eosinophils Absolute: 0 K/uL (ref 0.0–0.7)
Eosinophils Relative: 0 % (ref 0–5)
HCT: 39.4 % (ref 36.0–46.0)
Hemoglobin: 13.5 g/dL (ref 12.0–15.0)
Lymphocytes Relative: 7 % — ABNORMAL LOW (ref 12–46)
Lymphs Abs: 0.6 K/uL — ABNORMAL LOW (ref 0.7–4.0)
MCH: 31.1 pg (ref 26.0–34.0)
MCHC: 34.3 g/dL (ref 30.0–36.0)
MCV: 90.8 fL (ref 78.0–100.0)
Monocytes Absolute: 0.8 K/uL (ref 0.1–1.0)
Monocytes Relative: 9 % (ref 3–12)
Neutro Abs: 7.2 K/uL (ref 1.7–7.7)
Neutrophils Relative %: 84 % — ABNORMAL HIGH (ref 43–77)
Platelets: 154 K/uL (ref 150–400)
RBC: 4.34 MIL/uL (ref 3.87–5.11)
RDW: 14.5 % (ref 11.5–15.5)
WBC: 8.7 K/uL (ref 4.0–10.5)

## 2014-04-02 LAB — URINALYSIS, ROUTINE W REFLEX MICROSCOPIC
Bilirubin Urine: NEGATIVE
Glucose, UA: NEGATIVE mg/dL
Ketones, ur: 15 mg/dL — AB
Nitrite: POSITIVE — AB
Protein, ur: 30 mg/dL — AB
Specific Gravity, Urine: 1.026 (ref 1.005–1.030)
Urobilinogen, UA: 1 mg/dL (ref 0.0–1.0)
pH: 5 (ref 5.0–8.0)

## 2014-04-02 LAB — URINE MICROSCOPIC-ADD ON

## 2014-04-02 MED ORDER — CEPHALEXIN 500 MG PO CAPS: 500.0000 mg | ORAL_CAPSULE | Freq: Four times a day (QID) | ORAL | Status: DC

## 2014-04-02 MED ORDER — CEPHALEXIN 250 MG PO CAPS
500.0000 mg | ORAL_CAPSULE | Freq: Once | ORAL | Status: AC
  Administered 2014-04-02: 500 mg via ORAL
  Filled 2014-04-02: qty 2

## 2014-04-02 NOTE — ED Notes (Signed)
Spoke with Phillinda, Advice worker at [facility]. Discussed UTI diagnosis and treatment and patient returning there rather than admission (per Dr. Effie Shy request). Phillinda stated they will accept patient back and understand treatment.

## 2014-04-02 NOTE — ED Provider Notes (Signed)
CSN: [id number]     Arrival date & time 04/02/14  1113 History   First MD Initiated Contact with Patient 04/02/14 1135     Chief Complaint  Patient presents with  . Altered Mental Status     (Consider location/radiation/quality/duration/timing/severity/associated sxs/prior Treatment) Patient is a 80 y.o. female presenting with altered mental status. The history is provided by the patient.  Altered Mental Status   Deborah Gilbert is a 80 y.o. female who presents for evaluation of altered mental status. She was evaluated in the ED 3 days ago after a fall. She has not been her normal self in the last 3 days, walking, less, eating less, and sleepier than usual. She is unable to give history.  Level V caveat- dementia   Past Medical History  Diagnosis Date  . Painful respiration   . Effusion of lower leg joint   . Dysuria   . Family history of malignant neoplasm of gastrointestinal tract   . Diarrhea   . Painful respiration   . Effusion of lower leg joint   . HOH (hard of hearing)   . Osteoarthritis   . Macular degeneration    Past Surgical History  Procedure Laterality Date  . Hip sx-right  1993  . Child birth    . Catract surgery/bilateral    . Appendectomy     History reviewed. No pertinent family history. History  Substance Use Topics  . Smoking status: Unknown If Ever Smoked  . Smokeless tobacco: Not on file  . Alcohol Use: No   OB History   Grav Para Term Preterm Abortions TAB SAB Ect Mult Living                 Review of Systems  Unable to perform ROS     Allergies  Famotidine; Pepcid; Tetracycline; Tetracyclines & related; and Trimethoprim  Home Medications   Prior to Admission medications   Medication Sig Start Date End Date Taking? Authorizing Provider  beta carotene w/minerals (OCUVITE) tablet Take 1 tablet by mouth daily.   Yes Historical Provider, MD  Calcium Carbonate-Vitamin D (CALCIUM 600+D) 600-400 MG-UNIT per tablet Take 1 tablet by mouth  daily.   Yes Historical Provider, MD  Cranberry 500 MG CAPS Take 500 mg by mouth 2 (two) times daily.   Yes Historical Provider, MD  meclizine (ANTIVERT) 12.5 MG tablet Take 12.5 mg by mouth daily.    Yes Historical Provider, MD  Calcium Carbonate-Vitamin D (CALCIUM 600/VITAMIN D) 600-400 MG-UNIT per tablet Take 1 tablet by mouth daily. 04/14/12 04/14/13  Roderick PeeJeffrey A [name], MD   BP 114/50  Pulse 67  Temp(Src) 99 F (37.2 C) (Rectal)  Resp 16  Ht 5' (1.524 m)  Wt 98 lb (44.453 kg)  BMI 19.14 kg/m2  SpO2 96% Physical Exam  Nursing note and vitals reviewed. Constitutional: She appears well-developed and well-nourished.  HENT:  Head: Normocephalic and atraumatic.  Right Ear: External ear normal.  Left Ear: External ear normal.  Mouth/Throat: Oropharynx is clear and moist.  Eyes: Conjunctivae and EOM are normal. Pupils are equal, round, and reactive to light.  Neck: Normal range of motion and phonation normal. Neck supple.  Cardiovascular: Normal rate, regular rhythm and intact distal pulses.   Pulmonary/Chest: Effort normal and breath sounds normal. No respiratory distress. She exhibits no tenderness.  Abdominal: Soft. She exhibits no distension. There is no tenderness. There is no guarding.  Musculoskeletal: Normal range of motion. She exhibits no tenderness.  Neurological: She is alert. No  cranial nerve deficit. She exhibits normal muscle tone.  Skin: Skin is warm and dry.  Psychiatric: She has a normal mood and affect.    ED Course  Procedures (including critical care time)  Rectal temperature check is normal, no fever.  Medications - No data to display  Patient Vitals for the past 24 hrs:  BP Temp Temp src Pulse Resp SpO2 Height Weight  04/02/14 1236 114/50 mmHg - - 67 16 96 % - -  04/02/14 1229 - 99 F (37.2 C) Rectal - - - - -  04/02/14 1200 110/49 mmHg - - 62 16 93 % - -  04/02/14 1118 119/56 mmHg 97.9 F (36.6 C) Oral 62 16 95 % 5' (1.524 m) 98 lb (44.453 kg)    1:14  PM Reevaluation with update and discussion. After initial assessment and treatment, an updated evaluation reveals tolerating oral fluid and med.Flint Melter    Labs Review Labs Reviewed  CBC WITH DIFFERENTIAL - Abnormal; Notable for the following:    Neutrophils Relative % 84 (*)    Lymphocytes Relative 7 (*)    Lymphs Abs 0.6 (*)    All other components within normal limits  BASIC METABOLIC PANEL - Abnormal; Notable for the following:    Potassium 3.6 (*)    BUN 25 (*)    GFR calc non Af Amer 75 (*)    GFR calc Af Amer 86 (*)    All other components within normal limits  URINALYSIS, ROUTINE W REFLEX MICROSCOPIC - Abnormal; Notable for the following:    Color, Urine AMBER (*)    APPearance TURBID (*)    Hgb urine dipstick LARGE (*)    Ketones, ur 15 (*)    Protein, ur 30 (*)    Nitrite POSITIVE (*)    Leukocytes, UA MODERATE (*)    All other components within normal limits  URINE MICROSCOPIC-ADD ON - Abnormal; Notable for the following:    Bacteria, UA MANY (*)    All other components within normal limits  URINE CULTURE    Imaging Review No results found.   EKG Interpretation None      MDM   Final diagnoses:  UTI (lower urinary tract infection)  Altered mental state    Urinalysis is consistent with acute urinary tract infection. This likely explains her altered mental status. ED eval. is consistent with uncomplicated urinary tract infection. I believe this is consistent with the initial thought for altered mental status. She tolerated oral liquids, and medication in emergency department   Nursing Notes Reviewed/ Care Coordinated Applicable Imaging Reviewed Interpretation of Laboratory Data incorporated into ED treatment  The patient appears reasonably screened and/or stabilized for discharge and I doubt any other medical condition or other [facility] requiring further screening, evaluation, or treatment in the ED at this time prior to discharge.  Plan: Home  Medications- Keflex; Home Treatments- rest; return here if the recommended treatment, does not improve the symptoms; Recommended follow up- PCP 3 days  Flint Melter, MD 04/02/14 1353

## 2014-04-02 NOTE — ED Notes (Signed)
Attempted to ambulate in hall; patient states to leave her alone and stop touching her. Notified Dr. Effie Shy.

## 2014-04-02 NOTE — ED Notes (Signed)
From [facility]; sent for confusion for three days; she fell 3 days ago and was brought to ER for treatment, returned same day to [facility] with no diagnosis and has been confused ever since. Has been in bed and not her usual independent self since the fall 3 days ago.

## 2014-04-02 NOTE — Discharge Instructions (Signed)
Her altered mental status is likely due to a urinary tract infection. Have her start the antibiotic prescription, today. Encourage her to drink 2-3 extra glasses of water each day.  Her mental status should gradually improve. Return here, if needed, for problems.     Altered Mental Status Altered mental status most often refers to an abnormal change in your responsiveness and awareness. It can affect your speech, thought, mobility, memory, attention span, or alertness. It can range from slight confusion to complete unresponsiveness (coma). Altered mental status can be a sign of a serious underlying medical condition. Rapid evaluation and medical treatment is necessary for patients having an altered mental status. CAUSES   Low blood sugar (hypoglycemia) or diabetes.  Severe loss of body fluids (dehydration) or a body salt (electrolyte) imbalance.  A stroke or other neurologic problem, such as dementia or delirium.  A head injury or tumor.  A drug or alcohol overdose.  Exposure to toxins or poisons.  Depression, anxiety, and stress.  A low oxygen level (hypoxia).  An infection.  Blood loss.  Twitching or shaking (seizure).  Heart problems, such as heart attack or heart rhythm problems (arrhythmias).  A body temperature that is too low or too high (hypothermia or hyperthermia). DIAGNOSIS  A diagnosis is based on your history, symptoms, physical and neurologic examinations, and diagnostic tests. Diagnostic tests may include:  Measurement of your blood pressure, pulse, breathing, and oxygen levels (vital signs).  Blood tests.  Urine tests.  X-ray exams.  A computerized magnetic scan (magnetic resonance imaging, MRI).  A computerized X-ray scan (computed tomography, CT scan). TREATMENT  Treatment will depend on the cause. Treatment may include:  Management of an underlying medical or mental health condition.  Critical care or support in the hospital. HOME CARE  INSTRUCTIONS   Only take over-the-counter or prescription medicines for pain, discomfort, or fever as directed by your caregiver.  Manage underlying conditions as directed by your caregiver.  Eat a healthy, well-balanced diet to maintain strength.  Join a support group or prevention program to cope with the condition or trauma that caused the altered mental status. Ask your caregiver to help choose a program that works for you.  Follow up with your caregiver for further examination, therapy, or testing as directed. SEEK MEDICAL CARE IF:   You feel unwell or have chills.  You or your family notice a change in your behavior or your alertness.  You have trouble following your caregiver's treatment plan.  You have questions or concerns. SEEK IMMEDIATE MEDICAL CARE IF:   You have a rapid heartbeat or have chest pain.  You have difficulty breathing.  You have a fever.  You have a headache with a stiff neck.  You cough up blood.  You have blood in your urine or stool.  You have severe agitation or confusion. MAKE SURE YOU:   Understand these instructions.  Will watch your condition.  Will get help right away if you are not doing well or get worse. Document Released: 04/10/2010 Document Revised: 01/13/2012 Document Reviewed: 04/10/2010 [facility] Patient Information 2014 [place], [place].  Urinary Tract Infection Urinary tract infections (UTIs) can develop anywhere along your urinary tract. Your urinary tract is your body's drainage system for removing wastes and extra water. Your urinary tract includes two kidneys, two ureters, a bladder, and a urethra. Your kidneys are a pair of bean-shaped organs. Each kidney is about the size of your fist. They are located below your ribs, one on each side  of your spine. CAUSES Infections are caused by microbes, which are microscopic organisms, including fungi, viruses, and bacteria. These organisms are so small that they can only be seen  through a microscope. Bacteria are the microbes that most commonly cause UTIs. SYMPTOMS  Symptoms of UTIs may vary by age and gender of the patient and by the location of the infection. Symptoms in young women typically include a frequent and intense urge to urinate and a painful, burning feeling in the bladder or urethra during urination. Older women and men are more likely to be tired, shaky, and weak and have muscle aches and abdominal pain. A fever may mean the infection is in your kidneys. Other symptoms of a kidney infection include pain in your back or sides below the ribs, nausea, and vomiting. DIAGNOSIS To diagnose a UTI, your caregiver will ask you about your symptoms. Your caregiver also will ask to provide a urine sample. The urine sample will be tested for bacteria and white blood cells. White blood cells are made by your body to help fight infection. TREATMENT  Typically, UTIs can be treated with medication. Because most UTIs are caused by a bacterial infection, they usually can be treated with the use of antibiotics. The choice of antibiotic and length of treatment depend on your symptoms and the type of bacteria causing your infection. HOME CARE INSTRUCTIONS  If you were prescribed antibiotics, take them exactly as your caregiver instructs you. Finish the medication even if you feel better after you have only taken some of the medication.  Drink enough water and fluids to keep your urine clear or pale yellow.  Avoid caffeine, tea, and carbonated beverages. They tend to irritate your bladder.  Empty your bladder often. Avoid holding urine for long periods of time.  Empty your bladder before and after sexual intercourse.  After a bowel movement, women should cleanse from front to back. Use each tissue only once. SEEK MEDICAL CARE IF:   You have back pain.  You develop a fever.  Your symptoms do not begin to resolve within 3 days. SEEK IMMEDIATE MEDICAL CARE IF:   You have  severe back pain or lower abdominal pain.  You develop chills.  You have nausea or vomiting.  You have continued burning or discomfort with urination. MAKE SURE YOU:   Understand these instructions.  Will watch your condition.  Will get help right away if you are not doing well or get worse. Document Released: 07/31/2005 Document Revised: 04/21/2012 Document Reviewed: 11/29/2011 [facility] Patient Information 2014 [place], [place].

## 2014-04-04 LAB — URINE CULTURE: Colony Count: >=100000

## 2014-04-05 ENCOUNTER — Telehealth (HOSPITAL_BASED_OUTPATIENT_CLINIC_OR_DEPARTMENT_OTHER): Payer: Self-pay | Admitting: Emergency Medicine

## 2014-04-05 NOTE — Telephone Encounter (Signed)
Post ED Visit - Positive Culture Follow-up: Successful Patient Follow-Up  Culture assessed and recommendations reviewed by: []  Wes Dulaney, Pharm.D., BCPS []  Celedonio Miyamoto, Pharm.D., BCPS []  Georgina Pillion, Pharm.D., BCPS [x]  [place], [place].D., BCPS, AAHIVP []  Estella Husk, Pharm.D., BCPS, AAHIVP  Positive urine culture  []  Patient discharged without antimicrobial prescription and treatment is now indicated [x]  Organism is resistant to prescribed ED discharge antimicrobial []  Patient with positive blood cultures  Changes discussed with ED provider: [name] PA-C New antibiotic prescription: Levofloxacin 250 mg x 3 days    [facility] 04/05/2014, 1:06 PM

## 2014-04-05 NOTE — Progress Notes (Signed)
ED Antimicrobial Stewardship Positive Culture Follow Up   Deborah Gilbert is an 81 y.o. female who presented to [facility] on 04/02/2014 with a chief complaint of  Chief Complaint  Patient presents with  . Altered Mental Status    Recent Results (from the past 720 hour(s))  URINE CULTURE     Status: None   Collection Time    04/02/14 12:20 PM      Result Value Ref Range Status   Specimen Description URINE, CATHETERIZED   Final   Special Requests NONE   Final   Culture  Setup Time     Final   Value: 04/02/2014 18:53     Performed at [facility] Count     Final   Value: >=100,000 COLONIES/ML     Performed at [facility]   Culture     Final   Value: ENTEROBACTER CLOACAE     VIRIDANS STREPTOCOCCUS     Performed at [facility]   Report Status 04/04/2014 FINAL   Final   Organism ID, Bacteria ENTEROBACTER CLOACAE   Final    [x]  Treated with cephalexin, organism resistant to prescribed antimicrobial  [name] presented to the ED with CC of AMS and diagnosed with UTI. UA was positive for nitrites, moderate leukocytes, and WBC. Patient was prescribed cephalexin, but culture report indicates enterobacter cloacae is resistant to cefazolin which is same generation as cephalexin. It was sensitive to all other antibiotics other than nitrofurantoin. Culture also grew viridans strep, but no sensitivities reported.   New antibiotic prescription: Levofloxacin 250mg  x 3 days  ED Provider: [name], PA  [name] 04/05/2014, 9:50 AM Infectious Diseases Pharmacist Phone# [phone]

## 2014-04-10 NOTE — Telephone Encounter (Signed)
Patient is a resident at [facility]. Called and spoke with Patient's nurse. Will fax results to [phone].

## 2014-06-16 ENCOUNTER — Encounter: Payer: Self-pay | Admitting: Internal Medicine

## 2014-08-18 ENCOUNTER — Inpatient Hospital Stay (HOSPITAL_COMMUNITY)
Admission: EM | Admit: 2014-08-18 | Discharge: 2014-08-20 | DRG: 689 | Disposition: A | Discharge status: Skilled Nursing Facility | Payer: Medicare Other | Attending: Internal Medicine | Admitting: Internal Medicine

## 2014-08-18 ENCOUNTER — Emergency Department (HOSPITAL_COMMUNITY): Payer: Medicare Other

## 2014-08-18 ENCOUNTER — Encounter (HOSPITAL_COMMUNITY): Payer: Self-pay | Admitting: Emergency Medicine

## 2014-08-18 DIAGNOSIS — Z681 Body mass index (BMI) 19 or less, adult: Secondary | ICD-10-CM

## 2014-08-18 DIAGNOSIS — R531 Weakness: Secondary | ICD-10-CM | POA: Diagnosis not present

## 2014-08-18 DIAGNOSIS — R4182 Altered mental status, unspecified: Secondary | ICD-10-CM

## 2014-08-18 DIAGNOSIS — Z79899 Other long term (current) drug therapy: Secondary | ICD-10-CM

## 2014-08-18 DIAGNOSIS — B9620 Unspecified Escherichia coli [E. coli] as the cause of diseases classified elsewhere: Secondary | ICD-10-CM | POA: Diagnosis present

## 2014-08-18 DIAGNOSIS — N182 Chronic kidney disease, stage 2 (mild): Secondary | ICD-10-CM | POA: Diagnosis present

## 2014-08-18 DIAGNOSIS — H3530 Unspecified macular degeneration: Secondary | ICD-10-CM | POA: Diagnosis present

## 2014-08-18 DIAGNOSIS — Z87898 Personal history of other specified conditions: Secondary | ICD-10-CM | POA: Insufficient documentation

## 2014-08-18 DIAGNOSIS — R400 Somnolence: Secondary | ICD-10-CM

## 2014-08-18 DIAGNOSIS — F0390 Unspecified dementia without behavioral disturbance: Secondary | ICD-10-CM | POA: Diagnosis present

## 2014-08-18 DIAGNOSIS — R1310 Dysphagia, unspecified: Secondary | ICD-10-CM | POA: Diagnosis present

## 2014-08-18 DIAGNOSIS — E43 Unspecified severe protein-calorie malnutrition: Secondary | ICD-10-CM | POA: Diagnosis present

## 2014-08-18 DIAGNOSIS — I4891 Unspecified atrial fibrillation: Secondary | ICD-10-CM | POA: Diagnosis present

## 2014-08-18 DIAGNOSIS — D631 Anemia in chronic kidney disease: Secondary | ICD-10-CM | POA: Diagnosis present

## 2014-08-18 DIAGNOSIS — I480 Paroxysmal atrial fibrillation: Secondary | ICD-10-CM | POA: Diagnosis not present

## 2014-08-18 DIAGNOSIS — I4819 Other persistent atrial fibrillation: Secondary | ICD-10-CM

## 2014-08-18 DIAGNOSIS — H9190 Unspecified hearing loss, unspecified ear: Secondary | ICD-10-CM | POA: Diagnosis present

## 2014-08-18 DIAGNOSIS — N390 Urinary tract infection, site not specified: Principal | ICD-10-CM | POA: Diagnosis present

## 2014-08-18 DIAGNOSIS — M1990 Unspecified osteoarthritis, unspecified site: Secondary | ICD-10-CM | POA: Diagnosis present

## 2014-08-18 LAB — COMPREHENSIVE METABOLIC PANEL
ALBUMIN: 2.1 g/dL — AB (ref 3.5–5.2)
ALK PHOS: 91 U/L (ref 39–117)
ALT: 8 U/L (ref 0–35)
AST: 10 U/L (ref 0–37)
Anion gap: 14 (ref 5–15)
BUN: 25 mg/dL — ABNORMAL HIGH (ref 6–23)
CALCIUM: 8.3 mg/dL — AB (ref 8.4–10.5)
CO2: 23 mEq/L (ref 19–32)
Chloride: 104 mEq/L (ref 96–112)
Creatinine, Ser: 0.64 mg/dL (ref 0.50–1.10)
GFR calc non Af Amer: 75 mL/min — ABNORMAL LOW (ref >90)
GFR, EST AFRICAN AMERICAN: 87 mL/min — AB (ref >90)
Glucose, Bld: 104 mg/dL — ABNORMAL HIGH (ref 70–99)
POTASSIUM: 3.9 meq/L (ref 3.7–5.3)
Sodium: 141 mEq/L (ref 137–147)
Total Bilirubin: 0.9 mg/dL (ref 0.3–1.2)
Total Protein: 5.7 g/dL — ABNORMAL LOW (ref 6.0–8.3)

## 2014-08-18 LAB — URINALYSIS, ROUTINE W REFLEX MICROSCOPIC
GLUCOSE, UA: NEGATIVE mg/dL
KETONES UR: 40 mg/dL — AB
Nitrite: POSITIVE — AB
PROTEIN: 30 mg/dL — AB
Specific Gravity, Urine: 1.031 — ABNORMAL HIGH (ref 1.005–1.030)
Urobilinogen, UA: 1 mg/dL (ref 0.0–1.0)
pH: 5 (ref 5.0–8.0)

## 2014-08-18 LAB — CBC WITH DIFFERENTIAL/PLATELET
BASOS ABS: 0 10*3/uL (ref 0.0–0.1)
BASOS PCT: 0 % (ref 0–1)
EOS PCT: 0 % (ref 0–5)
Eosinophils Absolute: 0 10*3/uL (ref 0.0–0.7)
HCT: 35.2 % — ABNORMAL LOW (ref 36.0–46.0)
Hemoglobin: 11.6 g/dL — ABNORMAL LOW (ref 12.0–15.0)
LYMPHS ABS: 0.6 10*3/uL — AB (ref 0.7–4.0)
Lymphocytes Relative: 9 % — ABNORMAL LOW (ref 12–46)
MCH: 29.7 pg (ref 26.0–34.0)
MCHC: 33 g/dL (ref 30.0–36.0)
MCV: 90.3 fL (ref 78.0–100.0)
Monocytes Absolute: 0.7 10*3/uL (ref 0.1–1.0)
Monocytes Relative: 10 % (ref 3–12)
NEUTROS PCT: 81 % — AB (ref 43–77)
Neutro Abs: 5.4 10*3/uL (ref 1.7–7.7)
PLATELETS: 218 10*3/uL (ref 150–400)
RBC: 3.9 MIL/uL (ref 3.87–5.11)
RDW: 13.1 % (ref 11.5–15.5)
WBC: 6.7 10*3/uL (ref 4.0–10.5)

## 2014-08-18 LAB — I-STAT TROPONIN, ED: Troponin i, poc: 0.03 ng/mL (ref 0.00–0.08)

## 2014-08-18 LAB — CBC
HEMATOCRIT: 34.6 % — AB (ref 36.0–46.0)
HEMOGLOBIN: 11.3 g/dL — AB (ref 12.0–15.0)
MCH: 29.6 pg (ref 26.0–34.0)
MCHC: 32.7 g/dL (ref 30.0–36.0)
MCV: 90.6 fL (ref 78.0–100.0)
Platelets: 230 10*3/uL (ref 150–400)
RBC: 3.82 MIL/uL — ABNORMAL LOW (ref 3.87–5.11)
RDW: 13.1 % (ref 11.5–15.5)
WBC: 5.2 10*3/uL (ref 4.0–10.5)

## 2014-08-18 LAB — URINE MICROSCOPIC-ADD ON

## 2014-08-18 LAB — CREATININE, SERUM
Creatinine, Ser: 0.6 mg/dL (ref 0.50–1.10)
GFR, EST AFRICAN AMERICAN: 89 mL/min — AB (ref >90)
GFR, EST NON AFRICAN AMERICAN: 77 mL/min — AB (ref >90)

## 2014-08-18 LAB — TSH: TSH: 1.76 u[IU]/mL (ref 0.350–4.500)

## 2014-08-18 LAB — I-STAT CG4 LACTIC ACID, ED: LACTIC ACID, VENOUS: 0.9 mmol/L (ref 0.5–2.2)

## 2014-08-18 MED ORDER — ENOXAPARIN SODIUM 40 MG/0.4ML ~~LOC~~ SOLN
40.0000 mg | SUBCUTANEOUS | Status: DC
  Administered 2014-08-18 – 2014-08-19 (×2): 40 mg via SUBCUTANEOUS
  Filled 2014-08-18 (×3): qty 0.4

## 2014-08-18 MED ORDER — LORAZEPAM 2 MG/ML IJ SOLN
0.2500 mg | Freq: Once | INTRAMUSCULAR | Status: AC
  Administered 2014-08-18: 0.25 mg via INTRAVENOUS

## 2014-08-18 MED ORDER — DEXTROSE 5 % IV SOLN
1.0000 g | INTRAVENOUS | Status: DC
  Administered 2014-08-19 – 2014-08-20 (×2): 1 g via INTRAVENOUS
  Filled 2014-08-18 (×3): qty 10

## 2014-08-18 MED ORDER — DILTIAZEM HCL 100 MG IV SOLR
5.0000 mg/h | Freq: Once | INTRAVENOUS | Status: AC
  Administered 2014-08-18: 5 mg/h via INTRAVENOUS

## 2014-08-18 MED ORDER — METOPROLOL TARTRATE 5 MG/5ML IV SOLN: 2.5000 mg | INTRAVENOUS | Status: DC | PRN

## 2014-08-18 MED ORDER — LORAZEPAM 2 MG/ML IJ SOLN
INTRAMUSCULAR | Status: AC
  Filled 2014-08-18: qty 1

## 2014-08-18 MED ORDER — DILTIAZEM HCL 25 MG/5ML IV SOLN
10.0000 mg | Freq: Once | INTRAVENOUS | Status: AC
  Administered 2014-08-18: 10 mg via INTRAVENOUS

## 2014-08-18 MED ORDER — DEXTROSE 5 % IV SOLN
1.0000 g | Freq: Once | INTRAVENOUS | Status: AC
  Administered 2014-08-18: 1 g via INTRAVENOUS
  Filled 2014-08-18: qty 10

## 2014-08-18 MED ORDER — SODIUM CHLORIDE 0.9 % IV BOLUS (SEPSIS)
1000.0000 mL | Freq: Once | INTRAVENOUS | Status: AC
  Administered 2014-08-18: 1000 mL via INTRAVENOUS

## 2014-08-18 MED ORDER — METOPROLOL TARTRATE 1 MG/ML IV SOLN: 2.5000 mg | INTRAVENOUS | Status: DC | PRN

## 2014-08-18 MED ORDER — SODIUM CHLORIDE 0.9 % IV SOLN
INTRAVENOUS | Status: AC
  Administered 2014-08-18: 16:00:00 via INTRAVENOUS

## 2014-08-18 NOTE — ED Notes (Signed)
Pt hooked up to 12Lead, BP and Pulse Ox. Pt does not want to be placed in our Gown.

## 2014-08-18 NOTE — ED Provider Notes (Signed)
CSN: [id number]     Arrival date & time 08/18/14  1037 History   First MD Initiated Contact with Patient 08/18/14 1038     Chief Complaint  Patient presents with  . Weakness     (Consider location/radiation/quality/duration/timing/severity/associated sxs/prior Treatment) The history is provided by the patient and the EMS personnel.  Deborah Gilbert is a 80 y.o. female hx of recurrent UTIs, arthritis, dementia here with altered mental status. She has been more weak for the last 2 days. She has been diffusely weak and trouble walking for several days. She denies fever or cough or chest pain or abdominal pain. She has recurrent UTIs. Denies recent falls.   Level V caveat- dementia    Past Medical History  Diagnosis Date  . Painful respiration   . Effusion of lower leg joint   . Dysuria   . Family history of malignant neoplasm of gastrointestinal tract   . Diarrhea   . Painful respiration   . Effusion of lower leg joint   . HOH (hard of hearing)   . Osteoarthritis   . Macular degeneration    Past Surgical History  Procedure Laterality Date  . Hip sx-right  1993  . Child birth    . Catract surgery/bilateral    . Appendectomy     History reviewed. No pertinent family history. History  Substance Use Topics  . Smoking status: Unknown If Ever Smoked  . Smokeless tobacco: Not on file  . Alcohol Use: No   OB History   Grav Para Term Preterm Abortions TAB SAB Ect Mult Living                 Review of Systems  Neurological: Positive for weakness.  All other systems reviewed and are negative.     Allergies  Famotidine; Pepcid; Tetracycline; Tetracyclines & related; and Trimethoprim  Home Medications   Prior to Admission medications   Medication Sig Start Date End Date Taking? Authorizing Provider  beta carotene w/minerals (OCUVITE) tablet Take 1 tablet by mouth daily.   Yes Historical Provider, MD  Calcium Carbonate-Vitamin D (CALCIUM 600+D) 600-400 MG-UNIT per  tablet Take 1 tablet by mouth daily.   Yes Historical Provider, MD  Cranberry 250 MG CAPS Take 500 mg by mouth 2 (two) times daily.   Yes Historical Provider, MD  magnesium hydroxide (MILK OF MAGNESIA) 400 MG/5ML suspension Take 15 mLs by mouth daily as needed for mild constipation. If no relief in 8 hours give 6oz of prune juice with milk of mag   Yes Historical Provider, MD  meclizine (ANTIVERT) 12.5 MG tablet Take 12.5 mg by mouth daily.    Yes Historical Provider, MD   BP 96/48  Pulse 71  Temp(Src) 98.5 F (36.9 C) (Oral)  Resp 23  SpO2 98% Physical Exam  Nursing note and vitals reviewed. Constitutional:  Chronically ill, demented   HENT:  Head: Normocephalic and atraumatic.  Mouth/Throat: Oropharynx is clear and moist.  Eyes: Conjunctivae and EOM are normal. Pupils are equal, round, and reactive to light.  Neck: Normal range of motion. Neck supple.  Cardiovascular: Normal rate, regular rhythm and normal heart sounds.   Pulmonary/Chest: Effort normal and breath sounds normal. No respiratory distress. She has no wheezes. She has no rales.  Abdominal: Soft. Bowel sounds are normal. She exhibits no distension. There is no tenderness. There is no rebound.  Musculoskeletal: Normal range of motion. She exhibits no edema.  Neurological:  A &O x 2. Moving all extremities.  Skin: Skin is warm and dry.  Psychiatric:  Unable     ED Course  Procedures (including critical care time)  CRITICAL CARE Performed by: Silverio LayYAO, DAVID   Total critical care time: 30 min   Critical care time was exclusive of separately billable procedures and treating other patients.  Critical care was necessary to treat or prevent imminent or life-threatening deterioration.  Critical care was time spent personally by me on the following activities: development of treatment plan with patient and/or surrogate as well as nursing, discussions with consultants, evaluation of patient's response to treatment,  examination of patient, obtaining history from patient or surrogate, ordering and performing treatments and interventions, ordering and review of laboratory studies, ordering and review of radiographic studies, pulse oximetry and re-evaluation of patient's condition.   Labs Review Labs Reviewed  CBC WITH DIFFERENTIAL - Abnormal; Notable for the following:    Hemoglobin 11.6 (*)    HCT 35.2 (*)    Neutrophils Relative % 81 (*)    Lymphocytes Relative 9 (*)    Lymphs Abs 0.6 (*)    All other components within normal limits  COMPREHENSIVE METABOLIC PANEL - Abnormal; Notable for the following:    Glucose, Bld 104 (*)    BUN 25 (*)    Calcium 8.3 (*)    Total Protein 5.7 (*)    Albumin 2.1 (*)    GFR calc non Af Amer 75 (*)    GFR calc Af Amer 87 (*)    All other components within normal limits  URINALYSIS, ROUTINE W REFLEX MICROSCOPIC - Abnormal; Notable for the following:    Color, Urine ORANGE (*)    APPearance CLOUDY (*)    Specific Gravity, Urine 1.031 (*)    Hgb urine dipstick LARGE (*)    Bilirubin Urine SMALL (*)    Ketones, ur 40 (*)    Protein, ur 30 (*)    Nitrite POSITIVE (*)    Leukocytes, UA SMALL (*)    All other components within normal limits  URINE MICROSCOPIC-ADD ON - Abnormal; Notable for the following:    Squamous Epithelial / LPF FEW (*)    Bacteria, UA MANY (*)    Casts GRANULAR CAST (*)    All other components within normal limits  URINE CULTURE  I-STAT TROPOININ, ED  I-STAT CG4 LACTIC ACID, ED    Imaging Review Dg Chest Port 1 View  08/18/2014   CLINICAL DATA:  Altered mental status.  Weakness.  EXAM: PORTABLE CHEST - 1 VIEW  COMPARISON:  PA and lateral chest 01/22/2012.  FINDINGS: The lungs are clear. Small right pleural effusion is noted. There is no left pleural effusion or pneumothorax. Heart size is upper normal.  IMPRESSION: Small right pleural effusion. The examination is otherwise unremarkable.   Electronically Signed   By: Drusilla Kannerhomas  Dalessio  M.D.   On: 08/18/2014 11:11     EKG Interpretation   Date/Time:  Thursday August 18 2014 10:53:48 EDT Ventricular Rate:  119 PR Interval:  122 QRS Duration: 72 QT Interval:  344 QTC Calculation: 484 R Axis:   83 Text Interpretation:  afib with RVR Borderline right axis deviation  Nonspecific T abnrm, anterolateral leads Afib new since previous   Confirmed by YAO  MD, DAVID ([id number]) on 08/18/2014 12:12:45 PM      MDM   Final diagnoses:  Altered mental status  Somnolence  PAF (paroxysmal atrial fibrillation)    Deborah Gilbert is a 80 y.o. female here with weakness, AMS.  Consider infection. Will get labs, UA, CXR. No signs of trauma and no falls so will hold off on CT head.   11:45 AM Patient went into rapid afib. Given cardizem.   12 PM HR still 140s after 10 mg cardizem. Will start cardizem drip. Consulted cardiology.   1:06 PM Back in sinus now. cardizem drip d/c. Cath UA + UTI. Given ceftriaxone. Will admit.     Richardean Canal, MD 08/18/14 1314

## 2014-08-18 NOTE — Consult Note (Signed)
CARDIOLOGY CONSULT NOTE       Patient ID: Deborah GivensWilma L Gilbert MRN: [id number] DOB/AGE: July 29, 1920 80 y.o.  Admit date: 08/18/2014 Referring Physician:  Silverio LayYao Primary Physician: Evette GeorgesDD,[name], MD Primary Cardiologist:  New Reason for Consultation:  Afib  Active Problems:   * No active hospital problems. *   HPI:   80 yo admitted to ER with weakness, lethargy and change in mental status.  She has received ativan before I saw her and most of history from ER nurse.  History of dementia and frequent UTI's No history of afib, CAD , CHF.  She has some falls.  Was in NSR on admission and has been going in / out of PAF/flutter with rates as high as 160.  Started on iv cardizem with improvement.  She is unaware Of rapid HR.  No previous TIA/stroke   ROS All other systems reviewed and negative except as noted above  Past Medical History  Diagnosis Date  . Painful respiration   . Effusion of lower leg joint   . Dysuria   . Family history of malignant neoplasm of gastrointestinal tract   . Diarrhea   . Painful respiration   . Effusion of lower leg joint   . HOH (hard of hearing)   . Osteoarthritis   . Macular degeneration     History reviewed. No pertinent family history.  History   Social History  . Marital Status: Unknown    Spouse Name: N/A    Number of Children: N/A  . Years of Education: N/A   Occupational History  . Not on file.   Social History Main Topics  . Smoking status: Unknown If Ever Smoked  . Smokeless tobacco: Not on file  . Alcohol Use: No  . Drug Use: No  . Sexual Activity: Not on file   Other Topics Concern  . Not on file   Social History Narrative   ** Merged History Encounter **        Past Surgical History  Procedure Laterality Date  . Hip sx-right  1993  . Child birth    . Catract surgery/bilateral    . Appendectomy       . LORazepam          Physical Exam: Blood pressure 134/59, pulse 91, temperature 98.5 F (36.9 C),  temperature source Oral, resp. rate 27, SpO2 96.00%.   Frail sedated elderly female  HEENT: normal Neck supple with no adenopathy JVP normal no bruits no thyromegaly Lungs clear with no wheezing and good diaphragmatic motion Heart:  S1/S2 SEM murmur, no rub, gallop or click PMI normal Abdomen: benighn, BS positve, no tenderness, no AAA no bruit.  No HSM or HJR Distal pulses intact with no bruits No edema Neuro non-focal Skin warm and dry No muscular weakness   Labs:   Lab Results  Component Value Date   WBC 6.7 08/18/2014   HGB 11.6* 08/18/2014   HCT 35.2* 08/18/2014   MCV 90.3 08/18/2014   PLT 218 08/18/2014    Recent Labs Lab 08/18/14 1127  NA 141  K 3.9  CL 104  CO2 23  BUN 25*  CREATININE 0.64  CALCIUM 8.3*  PROT 5.7*  BILITOT 0.9  ALKPHOS 91  ALT 8  AST 10  GLUCOSE 104*    . LORazepam        No current facility-administered medications on file prior to encounter.   Current Outpatient Prescriptions on File Prior to Encounter  Medication Sig Dispense Refill  .  beta carotene w/minerals (OCUVITE) tablet Take 1 tablet by mouth daily.      . Calcium Carbonate-Vitamin D (CALCIUM 600+D) 600-400 MG-UNIT per tablet Take 1 tablet by mouth daily.      . meclizine (ANTIVERT) 12.5 MG tablet Take 12.5 mg by mouth daily.        Radiology: Dg Chest Port 1 View  08/18/2014   CLINICAL DATA:  Altered mental status.  Weakness.  EXAM: PORTABLE CHEST - 1 VIEW  COMPARISON:  PA and lateral chest 01/22/2012.  FINDINGS: The lungs are clear. Small right pleural effusion is noted. There is no left pleural effusion or pneumothorax. Heart size is upper normal.  IMPRESSION: Small right pleural effusion. The examination is otherwise unremarkable.   Electronically Signed   By: Drusilla Kannerhomas  Dalessio M.D.   On: 08/18/2014 11:11    EKG:  SR nonspecific ST changes with short bursts of PAF   ASSESSMENT AND PLAN:  PAF:   Trying to convert  BP soft hydrate and cut iv cardizem back to 5mg /hr.   Discussed with ER nurse  Does not appear to be a candidate for anticoagulation with age and dementia.  Echo  To assess EF and atrial sizes Mental Status:  Exam currently clouded by ativan.  Hydrate  Urine pending antibiotics per primary service  Would also check TSH/T4    ? Advanced directives would appear to be appropriate for DNR status  Signed: Charlton Hawseter Deondra Labrador 08/18/2014, 12:27 PM

## 2014-08-18 NOTE — ED Notes (Signed)
Lactic acid results given to lisa-pa

## 2014-08-18 NOTE — ED Notes (Signed)
Cardiologists at bedside

## 2014-08-18 NOTE — H&P (Signed)
History and Physical  Deborah Gilbert Gilbert:[id number] DOB: December 06, 1919 DOA: 08/18/2014  Referring physician: Chaney Mallingavid Yao, ER physician PCP: Evette GeorgesDD,[name], MD   Chief Complaint: Increased weakness  HPI: Deborah Gilbert is a 80 y.o. female  Past medical history of dementia who resides at a nursing home sent over for several days of increased weakness and trouble walking. She is on a large UTI and emergency room. Decubitus IV Rocephin. She was also noted in the emergency room to suddenly have new onset A. fib. Cardiology was consulted and patient was given a dose of IV Cardizem which resulted in rate control and certainly spontaneous conversion to normal sinus rhythm. Hospitalists were called for further evaluation.   Review of Systems:  Patient seen after arrival to floor. Patient was given dose of Ativan and was mostly somnolent, is able to waking, briefly. She is unable to interact with me in am not able to get any kind of review of systems. Her history present illness, past medical history is all obtained from records and other physicians.  Past Medical History  Diagnosis Date  . Painful respiration   . Effusion of lower leg joint   . Dysuria   . Family history of malignant neoplasm of gastrointestinal tract   . Diarrhea   . Painful respiration   . Effusion of lower leg joint   . HOH (hard of hearing)   . Osteoarthritis   . Macular degeneration    Past Surgical History  Procedure Laterality Date  . Hip sx-right  1993  . Child birth    . Catract surgery/bilateral    . Appendectomy     Social History:  reports that she does not drink alcohol or use illicit drugs. Her tobacco history is not on file. Patient resides in skilled nursing home. It looks like she reportedly is ambulatory with assistance, but cannot confirm this.  Allergies  Allergen Reactions  . Famotidine     REACTION: "felt like a heart attack"  . Pepcid [Famotidine] Other (See Comments)    Called patient's  nursing home and they do not have any reactions known  . Tetracycline     REACTION: Severe nausea and vomiting  . Tetracyclines & Related     Unknown   . Trimethoprim     Unknown     History reviewed. No pertinent family history.  Unable to clarify.  Prior to Admission medications   Medication Sig Start Date End Date Taking? Authorizing Provider  beta carotene w/minerals (OCUVITE) tablet Take 1 tablet by mouth daily.   Yes Historical Provider, MD  Calcium Carbonate-Vitamin D (CALCIUM 600+D) 600-400 MG-UNIT per tablet Take 1 tablet by mouth daily.   Yes Historical Provider, MD  Cranberry 250 MG CAPS Take 500 mg by mouth 2 (two) times daily.   Yes Historical Provider, MD  magnesium hydroxide (MILK OF MAGNESIA) 400 MG/5ML suspension Take 15 mLs by mouth daily as needed for mild constipation. If no relief in 8 hours give 6oz of prune juice with milk of mag   Yes Historical Provider, MD  meclizine (ANTIVERT) 12.5 MG tablet Take 12.5 mg by mouth daily.    Yes Historical Provider, MD    Physical Exam: BP 105/59  Pulse 99  Temp(Src) 98 F (36.7 C) (Axillary)  Resp 20  SpO2 100%  General:  Somewhat somnolent, chronic dementia Eyes: Sclerae appear nonicteric ENT: Mucous membranes are dry Neck: Minimal JVD Cardiovascular: Soft, prominent S1, occasional ectopic beat, regular rhythm and rate Respiratory: Clear  to auscultation bilaterally Abdomen: Soft, nontender, nondistended, positive bowel sounds Skin: No skin breaks, tears or lesions Musculoskeletal: No clubbing cyanosis or edema Psychiatric: Patient with chronic dementia Neurologic: No overt deficits noted, although limited due to compliance with exam           Labs on Admission:  Basic Metabolic Panel:  Recent Labs Lab 08/18/14 1127  NA 141  K 3.9  CL 104  CO2 23  GLUCOSE 104*  BUN 25*  CREATININE 0.64  CALCIUM 8.3*   Liver Function Tests:  Recent Labs Lab 08/18/14 1127  AST 10  ALT 8  ALKPHOS 91  BILITOT 0.9   PROT 5.7*  ALBUMIN 2.1*   No results found for this basename: LIPASE, AMYLASE,  in the last 168 hours No results found for this basename: AMMONIA,  in the last 168 hours CBC:  Recent Labs Lab 08/18/14 1127  WBC 6.7  NEUTROABS 5.4  HGB 11.6*  HCT 35.2*  MCV 90.3  PLT 218   Cardiac Enzymes: No results found for this basename: CKTOTAL, CKMB, CKMBINDEX, TROPONINI,  in the last 168 hours  BNP (last 3 results) No results found for this basename: PROBNP,  in the last 8760 hours CBG: No results found for this basename: GLUCAP,  in the last 168 hours  Radiological Exams on Admission: Dg Chest Port 1 View  08/18/2014   CLINICAL DATA:  Altered mental status.  Weakness.  EXAM: PORTABLE CHEST - 1 VIEW  COMPARISON:  PA and lateral chest 01/22/2012.  FINDINGS: The lungs are clear. Small right pleural effusion is noted. There is no left pleural effusion or pneumothorax. Heart size is upper normal.  IMPRESSION: Small right pleural effusion. The examination is otherwise unremarkable.   Electronically Signed   By: Drusilla Kannerhomas  Dalessio M.D.   On: 08/18/2014 11:11    EKG: Independently reviewed. A. fib with RVR, taken before she converted back to sinus rhythm   Assessment/Plan Present on Admission:  . UTI (lower urinary tract infection): Treat underlying UTI with IV Rocephin. Await cultures  . Dementia without behavioral disturbance . CKD (chronic kidney disease) stage 2, GFR 60-89 ml/min: Looks to be at baseline  . Atrial fibrillation: Currently back in normal sinus rhythm. Checking thyroid level plus echocardiogram. Given her advanced dementia and high fall risk, not appropriate for anticoagulation. For now when necessary rate control.  Consultants:  cardiology  Code Status:  full code, for now.  We'll need to discuss with family as it might be more appropriate for her to be DO NOT RESUSCITATE  Family Communication:  will try to find family   Disposition Plan:  continued hospital. Treat  urinary tract infection. Awaiting echo, possibly could return back to skilled nursing tomorrow  Time spent:  35 minutes  Hollice EspyKRISHNAN,Briony Parveen K [facility] Pager [phone]

## 2014-08-18 NOTE — ED Notes (Signed)
Inc. Weakness x 2 days; [facility]'s[facility] Alert to person and place. Normally alert and oriented. Walks normally.  Vss. SBP 98, 94% RA, HR 87, RR 16, cbg 100.

## 2014-08-19 DIAGNOSIS — R1310 Dysphagia, unspecified: Secondary | ICD-10-CM | POA: Diagnosis present

## 2014-08-19 DIAGNOSIS — E43 Unspecified severe protein-calorie malnutrition: Secondary | ICD-10-CM | POA: Diagnosis present

## 2014-08-19 DIAGNOSIS — R531 Weakness: Secondary | ICD-10-CM | POA: Diagnosis present

## 2014-08-19 DIAGNOSIS — H3530 Unspecified macular degeneration: Secondary | ICD-10-CM | POA: Diagnosis present

## 2014-08-19 DIAGNOSIS — M1990 Unspecified osteoarthritis, unspecified site: Secondary | ICD-10-CM | POA: Diagnosis present

## 2014-08-19 DIAGNOSIS — Z79899 Other long term (current) drug therapy: Secondary | ICD-10-CM | POA: Diagnosis not present

## 2014-08-19 DIAGNOSIS — H9190 Unspecified hearing loss, unspecified ear: Secondary | ICD-10-CM | POA: Diagnosis present

## 2014-08-19 DIAGNOSIS — B9620 Unspecified Escherichia coli [E. coli] as the cause of diseases classified elsewhere: Secondary | ICD-10-CM | POA: Diagnosis present

## 2014-08-19 DIAGNOSIS — F0390 Unspecified dementia without behavioral disturbance: Secondary | ICD-10-CM | POA: Diagnosis present

## 2014-08-19 DIAGNOSIS — D631 Anemia in chronic kidney disease: Secondary | ICD-10-CM | POA: Diagnosis present

## 2014-08-19 DIAGNOSIS — N182 Chronic kidney disease, stage 2 (mild): Secondary | ICD-10-CM | POA: Diagnosis present

## 2014-08-19 DIAGNOSIS — I480 Paroxysmal atrial fibrillation: Secondary | ICD-10-CM | POA: Diagnosis present

## 2014-08-19 DIAGNOSIS — Z681 Body mass index (BMI) 19 or less, adult: Secondary | ICD-10-CM | POA: Diagnosis not present

## 2014-08-19 DIAGNOSIS — N390 Urinary tract infection, site not specified: Secondary | ICD-10-CM | POA: Diagnosis present

## 2014-08-19 LAB — MRSA PCR SCREENING: MRSA BY PCR: NEGATIVE

## 2014-08-19 LAB — CBC
HCT: 32.4 % — ABNORMAL LOW (ref 36.0–46.0)
HEMOGLOBIN: 10.5 g/dL — AB (ref 12.0–15.0)
MCH: 29.7 pg (ref 26.0–34.0)
MCHC: 32.4 g/dL (ref 30.0–36.0)
MCV: 91.8 fL (ref 78.0–100.0)
PLATELETS: 216 10*3/uL (ref 150–400)
RBC: 3.53 MIL/uL — AB (ref 3.87–5.11)
RDW: 13.2 % (ref 11.5–15.5)
WBC: 5.4 10*3/uL (ref 4.0–10.5)

## 2014-08-19 LAB — BASIC METABOLIC PANEL
ANION GAP: 14 (ref 5–15)
BUN: 26 mg/dL — ABNORMAL HIGH (ref 6–23)
CO2: 20 mEq/L (ref 19–32)
Calcium: 8.1 mg/dL — ABNORMAL LOW (ref 8.4–10.5)
Chloride: 110 mEq/L (ref 96–112)
Creatinine, Ser: 0.62 mg/dL (ref 0.50–1.10)
GFR calc Af Amer: 88 mL/min — ABNORMAL LOW (ref >90)
GFR calc non Af Amer: 76 mL/min — ABNORMAL LOW (ref >90)
Glucose, Bld: 78 mg/dL (ref 70–99)
Potassium: 3.7 mEq/L (ref 3.7–5.3)
SODIUM: 144 meq/L (ref 137–147)

## 2014-08-19 LAB — T4, FREE: Free T4: 1.16 ng/dL (ref 0.80–1.80)

## 2014-08-19 MED ORDER — METOPROLOL TARTRATE 25 MG PO TABS: 25.0000 mg | ORAL_TABLET | Freq: Once | ORAL | Status: DC

## 2014-08-19 MED ORDER — METOPROLOL TARTRATE 25 MG PO TABS
25.0000 mg | ORAL_TABLET | Freq: Two times a day (BID) | ORAL | Status: DC
  Administered 2014-08-19 (×2): 25 mg via ORAL
  Filled 2014-08-19 (×5): qty 1

## 2014-08-19 MED ORDER — DILTIAZEM HCL 100 MG IV SOLR
5.0000 mg/h | INTRAVENOUS | Status: DC
  Administered 2014-08-19 (×2): 5 mg/h via INTRAVENOUS
  Filled 2014-08-19 (×2): qty 100

## 2014-08-19 MED ORDER — DILTIAZEM LOAD VIA INFUSION
10.0000 mg | Freq: Once | INTRAVENOUS | Status: AC
  Administered 2014-08-19: 10 mg via INTRAVENOUS
  Filled 2014-08-19: qty 10

## 2014-08-19 MED ORDER — ENSURE COMPLETE PO LIQD
237.0000 mL | Freq: Three times a day (TID) | ORAL | Status: DC
  Administered 2014-08-20: 237 mL via ORAL

## 2014-08-19 MED ORDER — FUROSEMIDE 10 MG/ML IJ SOLN: 20.0000 mg | Freq: Once | INTRAMUSCULAR | Status: DC

## 2014-08-19 NOTE — Progress Notes (Signed)
PT currently in NSR at 68 and maintaining in the range of mid 60's to 70's. Cardizem was at 10 cc/hr decreased to 5 cc/hr. Latest BP at 112/65. Will continue to monitor.

## 2014-08-19 NOTE — Progress Notes (Signed)
Patient ID: Deborah GivensWilma L Gilbert, female   DOB: Feb 08, 1920, 80 y.o.   MRN: [id number]    Subjective:  Lethargic  Nurse indicates no pains   Objective:  Filed Vitals:   08/18/14 1515 08/18/14 1952 08/19/14 0000 08/19/14 0339  BP: 105/59 124/48 98/71 110/76  Pulse: 99 77 90 89  Temp: 98 F (36.7 C) 98.2 F (36.8 C) 98.3 F (36.8 C) 98.5 F (36.9 C)  TempSrc: Axillary Axillary Axillary Axillary  Resp: 20 20 20 20   Weight:   94 lb 11.2 oz (42.956 kg)   SpO2: 100% 97% 99% 96%    Intake/Output from previous day:  Intake/Output Summary (Last 24 hours) at 08/19/14 0813 Last data filed at 08/18/14 1418  Gross per 24 hour  Intake   1050 ml  Output      0 ml  Net   1050 ml    Physical Exam: Lethargic  Frail elderly female  HEENT: normal Neck supple with no adenopathy JVP normal no bruits no thyromegaly Lungs clear with no wheezing and good diaphragmatic motion Heart:  S1/S2 SEM murmur, no rub, gallop or click PMI normal Abdomen: benighn, BS positve, no tenderness, no AAA no bruit.  No HSM or HJR Distal pulses intact with no bruits No edema Neuro non-focal Skin warm and dry No muscular weakness   Lab Results: Basic Metabolic Panel:  Recent Labs  40/98/1110/15/15 1127 08/18/14 2020 08/19/14 0312  NA 141  --  144  K 3.9  --  3.7  CL 104  --  110  CO2 23  --  20  GLUCOSE 104*  --  78  BUN 25*  --  26*  CREATININE 0.64 0.60 0.62  CALCIUM 8.3*  --  8.1*   Liver Function Tests:  Recent Labs  08/18/14 1127  AST 10  ALT 8  ALKPHOS 91  BILITOT 0.9  PROT 5.7*  ALBUMIN 2.1*   CBC:  Recent Labs  08/18/14 1127 08/18/14 2020 08/19/14 0312  WBC 6.7 5.2 5.4  NEUTROABS 5.4  --   --   HGB 11.6* 11.3* 10.5*  HCT 35.2* 34.6* 32.4*  MCV 90.3 90.6 91.8  PLT 218 230 216   Thyroid Function Tests:  Recent Labs  08/18/14 2020  TSH 1.760    Imaging: Dg Chest Port 1 View  08/18/2014   CLINICAL DATA:  Altered mental status.  Weakness.  EXAM: PORTABLE CHEST - 1 VIEW   COMPARISON:  PA and lateral chest 01/22/2012.  FINDINGS: The lungs are clear. Small right pleural effusion is noted. There is no left pleural effusion or pneumothorax. Heart size is upper normal.  IMPRESSION: Small right pleural effusion. The examination is otherwise unremarkable.   Electronically Signed   By: Drusilla Kannerhomas  Dalessio M.D.   On: 08/18/2014 11:11    Cardiac Studies:  ECG:  NSR PAF LVH   Telemetry:  NSR 88  Echo:   Medications:   . cefTRIAXone (ROCEPHIN)  IV  1 g Intravenous Q24H  . enoxaparin (LOVENOX) injection  40 mg Subcutaneous Q24H       Assessment/Plan:  PAF:  Not an anticoagulation candidate  PRN beta blocker written for not taking PO much.  Can add PO cardizem 30 q8 if needed   ID:  On rocephin frequent UTI's   MS:  Still lethargic TSH normal plan per primary service   Charlton HawsPeter Dakota Stangl 08/19/2014, 8:13 AM

## 2014-08-19 NOTE — Progress Notes (Signed)
PROGRESS NOTE  Deborah GivensWilma L Streetman ZOX:[id number] DOB: [birth year] DOA: 08/18/2014 PCP: Evette GeorgesDD,[name], MD  HPI/Recap of past 3124 hours: 80 year old female past medical history of dementia and recurrent UTI comes to the emergency room from her skilled nursing facility with reports of several days of weakness. Patient on of a large UTI and went into rapid atrial fibrillation. With dose of Lopressor, patient converted back to normal sinus rhythm and was admitted to the hospital service on the telemetry floor.  Early this morning, patient went back into rapid atrial fibrillation that has been very difficult to control. Cardizem started after bolus and titrated up to 15 g per hour plus IV Lopressor. Finally, several hours later, patient converted back to normal sinus rhythm with a heart rate of 90. Cardizem decreased to 10 mg an hour.  Patient herself unable to give me much in terms of subjective. She has chronic dementia and is unaware of her surroundings. She's not very interactive.  Assessment/Plan: Principal Problem:   PAF (paroxysmal atrial fibrillation): Back in normal sinus rhythm now. Has been difficult to control today. Has been started on oral metoprolol plus when necessary IV Lopressor and currently on Cardizem. Checking BNP and 2-D echo. If patient volume over loaded, gentle diuresis may help with less than of heart.  Active Problems:   UTI (lower urinary tract infection): On IV Rocephin    Dementia without behavioral disturbance: Stable. To return back to home facility as soon as we can to prevent worsening decline    CKD (chronic kidney disease) stage 2, GFR 60-89 ml/min: Looks to be at baseline.    Protein-calorie malnutrition, severe: Patient is criteria with severe malnutrition in the context of chronic illness as evidenced by severe fat and muscle wasting. Seen by speech therapy on a dysphagia 1 diet and started on nutritional and Ensure complete by mouth 3 times a day plus  mitral 3 times a day   Code Status: Full code-we'll need to discuss this with family  Family Communication: We'll attempt to reach family later today  Disposition Plan: Continue hospital until confirm that A. fib is under control   Consultants:  Cardiology  Procedures:  2-D echo pending  Antibiotics:  IV Rocephin 10/15-present   Objective: BP 100/60  Pulse 169  Temp(Src) 98.5 F (36.9 C) (Axillary)  Resp 21  Wt 42.956 kg (94 lb 11.2 oz)  SpO2 94%  Intake/Output Summary (Last 24 hours) at 08/19/14 1249 Last data filed at 08/18/14 1418  Gross per 24 hour  Intake   1050 ml  Output      0 ml  Net   1050 ml   Filed Weights   08/19/14 0000  Weight: 42.956 kg (94 lb 11.2 oz)    Exam:   General:  Chronic dementia, not very attractive, emaciated  Cardiovascular: Irregular rhythm, tachycardic-as seen before patient converted  Respiratory: Respiratory effort, clear auscultation bilaterally  Abdomen: Soft, nontender, nondistended, positive bowel sounds  Musculoskeletal: No clubbing or cyanosis or edema   Data Reviewed: Basic Metabolic Panel:  Recent Labs Lab 08/18/14 1127 08/18/14 2020 08/19/14 0312  NA 141  --  144  K 3.9  --  3.7  CL 104  --  110  CO2 23  --  20  GLUCOSE 104*  --  78  BUN 25*  --  26*  CREATININE 0.64 0.60 0.62  CALCIUM 8.3*  --  8.1*   Liver Function Tests:  Recent Labs Lab 08/18/14 1127  AST 10  ALT 8  ALKPHOS 91  BILITOT 0.9  PROT 5.7*  ALBUMIN 2.1*   No results found for this basename: LIPASE, AMYLASE,  in the last 168 hours No results found for this basename: AMMONIA,  in the last 168 hours CBC:  Recent Labs Lab 08/18/14 1127 08/18/14 2020 08/19/14 0312  WBC 6.7 5.2 5.4  NEUTROABS 5.4  --   --   HGB 11.6* 11.3* 10.5*  HCT 35.2* 34.6* 32.4*  MCV 90.3 90.6 91.8  PLT 218 230 216   Cardiac Enzymes:   No results found for this basename: CKTOTAL, CKMB, CKMBINDEX, TROPONINI,  in the last 168 hours BNP (last 3  results) No results found for this basename: PROBNP,  in the last 8760 hours CBG: No results found for this basename: GLUCAP,  in the last 168 hours  Recent Results (from the past 240 hour(s))  MRSA PCR SCREENING     Status: None   Collection Time    08/19/14  4:00 AM      Result Value Ref Range Status   MRSA by PCR NEGATIVE  NEGATIVE Final   Comment:            The GeneXpert MRSA Assay (FDA     approved for NASAL specimens     only), is one component of a     comprehensive MRSA colonization     surveillance program. It is not     intended to diagnose MRSA     infection nor to guide or     monitor treatment for     MRSA infections.     Studies: Dg Chest Port 1 View  08/18/2014   CLINICAL DATA:  Altered mental status.  Weakness.  EXAM: PORTABLE CHEST - 1 VIEW  COMPARISON:  PA and lateral chest 01/22/2012.  FINDINGS: The lungs are clear. Small right pleural effusion is noted. There is no left pleural effusion or pneumothorax. Heart size is upper normal.  IMPRESSION: Small right pleural effusion. The examination is otherwise unremarkable.   Electronically Signed   By: Drusilla Kannerhomas  Dalessio M.D.   On: 08/18/2014 11:11    Scheduled Meds: . cefTRIAXone (ROCEPHIN)  IV  1 g Intravenous Q24H  . enoxaparin (LOVENOX) injection  40 mg Subcutaneous Q24H  . feeding supplement (ENSURE COMPLETE)  237 mL Oral TID BM  . metoprolol tartrate  25 mg Oral BID    Continuous Infusions: . diltiazem (CARDIZEM) infusion 5 mg/hr (08/19/14 0944)     Time spent: 35 minutes  Hollice EspyKRISHNAN,Ahria Slappey K  [facility] Pager [phone]. If 7PM-7AM, please contact night-coverage at [email or web address], password [email or web address] 08/19/2014, 12:49 PM  LOS: 1 day

## 2014-08-19 NOTE — Progress Notes (Signed)
Clinical Social Work Department BRIEF PSYCHOSOCIAL ASSESSMENT 08/19/2014  Patient:  Deborah GivensOSBORNE,Deborah L     Account Number:  [id number]     Admit date:  08/18/2014  Clinical Social Worker:  Harless NakayamaAMBELAL,Beverlyann Broxterman, LCSWA  Date/Time:  08/19/2014 10:40 AM  Referred by:  Physician  Date Referred:  08/19/2014 Referred for  ALF Placement   Other Referral:   Interview type:  Patient Other interview type:    PSYCHOSOCIAL DATA Living Status:  FACILITY Admitted from facility:  [facility] Level of care:  Assisted Living Primary support name:  Bethanne GingerJuanita Smith Primary support relationship to patient:  FRIEND Degree of support available:   Unable to assess at this time    CURRENT CONCERNS Current Concerns  Post-Acute Placement   Other Concerns:    SOCIAL WORK ASSESSMENT / PLAN CSW notified by MD that [facility]. CSW tried calling pt contacts to confirm. Phone number for Trudi IdaDeanna Way is disconnected and CSW left voicemail for Health Net[facility]uanita Smith. CSW called facility and was notified that pt main contact is Ms. Smith. Ms. Katrinka BlazingSmith handles pt finances and is only pt contact per facility knowledge. CSW spoke with Tisha at facility to confirm that pt can dc back over the weekend. They will need an updatded FL2 faxed to [phone]. Weekend CSW will need to notify Reita ClicheBobby of weekend dc. Per facility, prior to admissino pt was alert and oriented and able to walk around. CSW will await return phone call from Ms. Smith to assess pt transportation needs at time of dc.   Assessment/plan status:  Psychosocial Support/Ongoing Assessment of Needs Other assessment/ plan:   Information/referral to community resources:   None needed    PATIENT'S/FAMILY'S RESPONSE TO PLAN OF CARE: Unable to speak with family at this time. Facility is agreeable to having pt return when medically stable.       Serenity Fortner, LCSWA [phone]

## 2014-08-19 NOTE — Evaluation (Signed)
Clinical/Bedside Swallow Evaluation Patient Details  Name: Deborah GivensWilma L Gilbert MRN: [id number] Date of Birth: Oct 23, 1920  Today's Date: 08/19/2014 Time:  -     Past Medical History:  Past Medical History  Diagnosis Date  . Painful respiration   . Effusion of lower leg joint   . Dysuria   . Family history of malignant neoplasm of gastrointestinal tract   . Diarrhea   . Painful respiration   . Effusion of lower leg joint   . HOH (hard of hearing)   . Osteoarthritis   . Macular degeneration    Past Surgical History:  Past Surgical History  Procedure Laterality Date  . Hip sx-right  1993  . Child birth    . Catract surgery/bilateral    . Appendectomy     HPI:    80 y.o. female with past medical history of dementia who resides at a nursing home sent over for several days of increased weakness and trouble walking wit new onset a-fib. Diagnosed with UTI.  CXR with small right pleural effusion, otherwise unremarkable.   Assessment / Plan / Recommendation Clinical Impression    Patient presents with a moderate dysphagia with oral and suspected pharygoesophageal components. Oral delayed consistent with dementia diagnosis noted. Patient without s/s of aspiration with thin liquids however consistent multiple swallows with soft solids followed by throat clearing suggestive of pharyngeal residuals due to weakness and/or esophageal component. Total assist for pureed solids followed by liquid wash appeared to assist in pharyngeal clearance and decreased s/s of aspiration. Combination of pureed solids and thin liquids consumed without s/s of difficulty. Recommend downgrading diet for efficiency and safety. Hopeful for ability to resume with some cognitive recovery.      Aspiration Risk    moderate   Diet Recommendation   1. Dysphagia 1 (puree), thin liquids 2. Small bites and sips 3. meds crushed in puree 4. Full supervision             Frequency and Duration   2x.week        Ferdinand LangoLeah  Deborah Tomei MA, CCC-SLP [phone]   Adorian Gwynne Meryl 08/19/2014,1:34 PM

## 2014-08-19 NOTE — Progress Notes (Signed)
INITIAL NUTRITION ASSESSMENT  DOCUMENTATION CODES Per approved criteria  -Severe malnutrition in the context of chronic illness  Pt meets criteria for severe MALNUTRITION in the context of chronic illness as evidenced by severe fat and muscle wasting.  INTERVENTION: - Ensure Complete po TID, each supplement provides 350 kcal and 13 grams of protein - Magic cup TID with meals, each supplement provides 290 kcal and 9 grams of protein - RD will continue to monitor for nutrition care plan.  NUTRITION DIAGNOSIS: Inadequate oral intake related to dementia as evidenced by wt loss.   Goal: Pt to meet >/= 90% of their estimated nutrition needs   Monitor:  Weight trend, po intake, acceptance of supplements, labs, I/O's  Reason for Assessment: Consult for nutrition assessment  80 y.o. female  Admitting Dx: PAF (paroxysmal atrial fibrillation)  ASSESSMENT: 80 y.o. female Past medical history of dementia who resides at a nursing home sent over for several days of increased weakness and trouble walking.  - Pt found to have UTI. - Pt was confused during RD visit and unable to provide nutrition history.  - Spoke with RN would said that pt will receive feeding assistance. Pt would only drink about 1/3 of an Ensure this morning. She was seen by SLP who recommended a Dysphagia 1 diet. Pt wears dentures which she does not have with her in this hospital.   Nutrition Focused Physical Exam:  Subcutaneous Fat:  Orbital Region: severe wasting Upper Arm Region: severe wasting Thoracic and Lumbar Region: severe wasting  Muscle:  Temple Region: severe wasting Clavicle Bone Region: severe wasting Clavicle and Acromion Bone Region: severe wasting Scapular Bone Region: severe wasting Dorsal Hand: severe wasting Patellar Region: severe wasting Anterior Thigh Region: severe wasting Posterior Calf Region: severe wasting  Edema: none  Height: Ht Readings from Last 1 Encounters:  04/02/14 5'  (1.524 m)   Weight: Wt Readings from Last 1 Encounters:  08/19/14 94 lb 11.2 oz (42.956 kg)   Ideal Body Weight: 100 lbs  % Ideal Body Weight: 94%  Wt Readings from Last 10 Encounters:  08/19/14 94 lb 11.2 oz (42.956 kg)  04/02/14 98 lb (44.453 kg)  02/19/12 98 lb (44.453 kg)  01/22/12 97 lb (43.999 kg)  08/01/11 102 lb (46.267 kg)  03/01/11 108 lb (48.988 kg)  02/26/11 106 lb (48.081 kg)  03/01/10 113 lb (51.256 kg)  02/12/10 109 lb (49.442 kg)  12/13/09 112 lb (50.803 kg)    Usual Body Weight: 98 lbs  % Usual Body Weight: 96%  BMI:  Body mass index is 18.49 kg/(m^2).  Estimated Nutritional Needs: Kcal: 1100-1300 Protein: 60-70 g Fluid: >1.3 L/day  Skin: Intact  Diet Order: Dysphagia  EDUCATION NEEDS: -No education needs identified at this time   Intake/Output Summary (Last 24 hours) at 08/19/14 1233 Last data filed at 08/18/14 1418  Gross per 24 hour  Intake   1050 ml  Output      0 ml  Net   1050 ml    Last BM: 10/15   Labs:   Recent Labs Lab 08/18/14 1127 08/18/14 2020 08/19/14 0312  NA 141  --  144  K 3.9  --  3.7  CL 104  --  110  CO2 23  --  20  BUN 25*  --  26*  CREATININE 0.64 0.60 0.62  CALCIUM 8.3*  --  8.1*  GLUCOSE 104*  --  78    CBG (last 3)  No results found for this basename:  GLUCAP,  in the last 72 hours  Scheduled Meds: . cefTRIAXone (ROCEPHIN)  IV  1 g Intravenous Q24H  . enoxaparin (LOVENOX) injection  40 mg Subcutaneous Q24H  . metoprolol tartrate  25 mg Oral BID    Continuous Infusions: . diltiazem (CARDIZEM) infusion 5 mg/hr (08/19/14 0944)    Past Medical History  Diagnosis Date  . Painful respiration   . Effusion of lower leg joint   . Dysuria   . Family history of malignant neoplasm of gastrointestinal tract   . Diarrhea   . Painful respiration   . Effusion of lower leg joint   . HOH (hard of hearing)   . Osteoarthritis   . Macular degeneration     Past Surgical History  Procedure Laterality  Date  . Hip sx-right  1993  . Child birth    . Catract surgery/bilateral    . Appendectomy      Deborah Gilbert RD, LDN

## 2014-08-20 DIAGNOSIS — N182 Chronic kidney disease, stage 2 (mild): Secondary | ICD-10-CM

## 2014-08-20 DIAGNOSIS — D631 Anemia in chronic kidney disease: Secondary | ICD-10-CM | POA: Diagnosis present

## 2014-08-20 DIAGNOSIS — I481 Persistent atrial fibrillation: Secondary | ICD-10-CM

## 2014-08-20 DIAGNOSIS — R1310 Dysphagia, unspecified: Secondary | ICD-10-CM

## 2014-08-20 MED ORDER — ENSURE COMPLETE PO LIQD: 237.0000 mL | Freq: Three times a day (TID) | ORAL | Status: DC

## 2014-08-20 MED ORDER — METOPROLOL TARTRATE 25 MG PO TABS: 25.0000 mg | ORAL_TABLET | Freq: Two times a day (BID) | ORAL | Status: DC

## 2014-08-20 MED ORDER — CIPROFLOXACIN HCL 500 MG PO TABS: 500.0000 mg | ORAL_TABLET | Freq: Two times a day (BID) | ORAL | Status: AC

## 2014-08-20 NOTE — Progress Notes (Signed)
Pt visited by a church member female who reports acknowledgement of pt being in the hospital Today ; would like to have  Update on patient since she has " no family member around". Reports inability to get information from patient due to her mental status. Unable to relay medical information but was told that pt will return to ALF in the afternoon if bed available and ALF accepting return on weekend.

## 2014-08-20 NOTE — Progress Notes (Signed)
Report given to Thurston PoundsBobbie, [place] Med Tech [facility]; questions answered. Transferred by [facility]  Awake, alert, but disoriented.

## 2014-08-20 NOTE — Progress Notes (Signed)
[facility]  [facility] agree[facility].  Pt's contact, Bethanne GingerJuanita Smith contacted and also agreeable.  Pt confused.

## 2014-08-20 NOTE — Discharge Summary (Signed)
Discharge Summary  Deborah GivensWilma L Gilbert [id number] DOB: Nov 18, 1919  PCP: Evette GeorgesDD,[name], MD  Admit date: 08/18/2014 Discharge date: 08/20/2014  Time spent: 25 minutes  Recommendations for Outpatient Follow-up:  1. New medication: Cipro 500 mg x4 more days 2. Patient's dieting downgraded to dysphagia 1 with thin liquids 3. Nutrition supplementation: Ensure complete 3 times a day between meals 4. Medications: Metoprolol 25 mg by mouth twice a day  Discharge Diagnoses:  Active Hospital Problems   Diagnosis Date Noted  . PAF (paroxysmal atrial fibrillation) 08/18/2014  . Anemia of chronic renal failure, stage 2 (mild) 08/20/2014  . Dysphagia 08/20/2014  . Protein-calorie malnutrition, severe 08/19/2014  . Dementia without behavioral disturbance 08/18/2014  . CKD (chronic kidney disease) stage 2, GFR 60-89 ml/min 08/18/2014  . Atrial fibrillation 08/18/2014  . UTI (lower urinary tract infection) 02/26/2011    Resolved Hospital Problems   Diagnosis Date Noted Date Resolved  No resolved problems to display.    Discharge Condition: Improved, being discharged back to skilled nursing facility  Diet recommendation: Dysphagia 1 diet with thin liquids, meds crushed in pure with Ensure complete supplementation 3 times a day between meals  Filed Weights   08/19/14 0000 08/20/14 0506  Weight: 42.956 kg (94 lb 11.2 oz) 42.855 kg (94 lb 7.7 oz)    History of present illness:  80 year old female past medical history of dementia and recurrent UTI comes to the emergency room from her skilled nursing facility with reports of several days of weakness. Patient on of a large UTI and went into rapid atrial fibrillation. With dose of Lopressor, patient converted back to normal sinus rhythm and was admitted to the hospital service on the telemetry floor  On 10/15   Hospital Course:  Principal Problem:   PAF (paroxysmal atrial fibrillation): On 10/16, patient went back into rapid atrial  fibrillation initially required Cardizem bolus and large doses of continuous Cardizem up to 15 mg per hour plus IV Lopressor. Fortunately, she finally converted by midafternoon back to normal sinus rhythm and continued to stay that way. Cardizem was able to be titrated down and patient was also put on by mouth Lopressor. She's been now in sinus rhythm with a controlled rate for over 18 hours. By mouth Lopressor will be started long-term. TSH checked and found to be normal Active Problems:   UTI (lower urinary tract infection): Urine grew out greater than 100,000 units of Escherichia coli, sensitivities pending. Has been on Rocephin x2 days. We'll follow cultures even after discharge. For now will discharge on by mouth Cipro 500 twice a day x4 more days    Dementia without behavioral disturbance: Stable. No issues.    CKD (chronic kidney disease) stage 2, GFR 60-89 ml/min with secondary anemia of chronic renal disease: Stable. Hemoglobin at 10.5    Protein-calorie malnutrition, severe: Seen by nutrition. Patient meets criteria with severe malnutrition in the context of chronic illness as evidence by her severe fat and muscle wasting. Started on Ensure complete 3 times a day between meals    Dysphagia: Given dementia and decreased level of overall responsiveness, speech therapy consulted and patient diet downgraded to dysphagia 1 with thin liquids   Procedures:   None  Consultations:   Cardiology  Discharge Exam: BP 100/44  Pulse 59  Temp(Src) 98.5 F (36.9 C) (Axillary)  Resp 18  Wt 42.855 kg (94 lb 7.7 oz)  SpO2 93%  General:  Chronic dementia, minimally interactive Cardiovascular:  Regular rhythm Respiratory:  Clear to auscultation bilaterally  Discharge Instructions You were cared for by a hospitalist during your hospital stay. If you have any questions about your discharge medications or the care you received while you were in the hospital after you are discharged, you can call  the unit and asked to speak with the hospitalist on call if the hospitalist that took care of you is not available. Once you are discharged, your primary care physician will handle any further medical issues. Please note that NO REFILLS for any discharge medications will be authorized once you are discharged, as it is imperative that you return to your primary care physician (or establish a relationship with a primary care physician if you do not have one) for your aftercare needs so that they can reassess your need for medications and monitor your lab values.  Discharge Instructions   DIET - DYS 1    Complete by:  As directed   Thin liquids Meds crushed in pure     Increase activity slowly    Complete by:  As directed             Medication List         beta carotene w/minerals tablet  Take 1 tablet by mouth daily.     CALCIUM 600+D 600-400 MG-UNIT per tablet  Generic drug:  Calcium Carbonate-Vitamin D  Take 1 tablet by mouth daily.     ciprofloxacin 500 MG tablet  Commonly known as:  CIPRO  Take 1 tablet (500 mg total) by mouth 2 (two) times daily.     Cranberry 250 MG Caps  Take 500 mg by mouth 2 (two) times daily.     feeding supplement (ENSURE COMPLETE) Liqd  Take 237 mLs by mouth 3 (three) times daily between meals.     magnesium hydroxide 400 MG/5ML suspension  Commonly known as:  MILK OF MAGNESIA  Take 15 mLs by mouth daily as needed for mild constipation. If no relief in 8 hours give 6oz of prune juice with milk of mag     meclizine 12.5 MG tablet  Commonly known as:  ANTIVERT  Take 12.5 mg by mouth daily.     metoprolol tartrate 25 MG tablet  Commonly known as:  LOPRESSOR  Take 1 tablet (25 mg total) by mouth 2 (two) times daily.       Allergies  Allergen Reactions  . Famotidine     REACTION: "felt like a heart attack"  . Pepcid [Famotidine] Other (See Comments)    Called patient's nursing home and they do not have any reactions known  . Tetracycline       REACTION: Severe nausea and vomiting  . Tetracyclines & Related     Unknown   . Trimethoprim     Unknown       The results of significant diagnostics from this hospitalization (including imaging, microbiology, ancillary and laboratory) are listed below for reference.    Significant Diagnostic Studies: Dg Chest Port 1 View  08/18/2014   CLINICAL DATA:  Altered mental status.  Weakness.  EXAM: PORTABLE CHEST - 1 VIEW  COMPARISON:  PA and lateral chest 01/22/2012.  FINDINGS: The lungs are clear. Small right pleural effusion is noted. There is no left pleural effusion or pneumothorax. Heart size is upper normal.  IMPRESSION: Small right pleural effusion. The examination is otherwise unremarkable.   Electronically Signed   By: Drusilla Kannerhomas  Dalessio M.D.   On: 08/18/2014 11:11    Microbiology: Recent Results (from the past 240 hour(s))  URINE  CULTURE     Status: None   Collection Time    08/18/14 11:57 AM      Result Value Ref Range Status   Specimen Description URINE, CATHETERIZED   Final   Special Requests NONE   Final   Culture  Setup Time     Final   Value: 08/18/2014 18:18     Performed at [facility] Count     Final   Value: >=100,000 COLONIES/ML     Performed at [facility]   Culture     Final   Value: ESCHERICHIA COLI     Performed at [facility]   Report Status PENDING   Incomplete  MRSA PCR SCREENING     Status: None   Collection Time    08/19/14  4:00 AM      Result Value Ref Range Status   MRSA by PCR NEGATIVE  NEGATIVE Final   Comment:            The GeneXpert MRSA Assay (FDA     approved for NASAL specimens     only), is one component of a     comprehensive MRSA colonization     surveillance program. It is not     intended to diagnose MRSA     infection nor to guide or     monitor treatment for     MRSA infections.     Labs: Basic Metabolic Panel:  Recent Labs Lab 08/18/14 1127 08/18/14 2020 08/19/14 0312  NA 141   --  144  K 3.9  --  3.7  CL 104  --  110  CO2 23  --  20  GLUCOSE 104*  --  78  BUN 25*  --  26*  CREATININE 0.64 0.60 0.62  CALCIUM 8.3*  --  8.1*   Liver Function Tests:  Recent Labs Lab 08/18/14 1127  AST 10  ALT 8  ALKPHOS 91  BILITOT 0.9  PROT 5.7*  ALBUMIN 2.1*   No results found for this basename: LIPASE, AMYLASE,  in the last 168 hours No results found for this basename: AMMONIA,  in the last 168 hours CBC:  Recent Labs Lab 08/18/14 1127 08/18/14 2020 08/19/14 0312  WBC 6.7 5.2 5.4  NEUTROABS 5.4  --   --   HGB 11.6* 11.3* 10.5*  HCT 35.2* 34.6* 32.4*  MCV 90.3 90.6 91.8  PLT 218 230 216   Cardiac Enzymes: No results found for this basename: CKTOTAL, CKMB, CKMBINDEX, TROPONINI,  in the last 168 hours BNP: BNP (last 3 results) No results found for this basename: PROBNP,  in the last 8760 hours CBG: No results found for this basename: GLUCAP,  in the last 168 hours     Signed:  [name]  [facility] 08/20/2014, 10:38 AM

## 2014-08-21 LAB — URINE CULTURE

## 2014-10-22 ENCOUNTER — Emergency Department (HOSPITAL_COMMUNITY): Payer: Medicare Other

## 2014-10-22 ENCOUNTER — Encounter (HOSPITAL_COMMUNITY): Payer: Self-pay | Admitting: Emergency Medicine

## 2014-10-22 ENCOUNTER — Inpatient Hospital Stay (HOSPITAL_COMMUNITY)
Admission: EM | Admit: 2014-10-22 | Discharge: 2014-10-27 | DRG: 602 | Disposition: A | Discharge status: Nursing Home (Includes ICF) | Payer: Medicare Other | Attending: Internal Medicine | Admitting: Internal Medicine

## 2014-10-22 DIAGNOSIS — Z681 Body mass index (BMI) 19 or less, adult: Secondary | ICD-10-CM | POA: Diagnosis not present

## 2014-10-22 DIAGNOSIS — N390 Urinary tract infection, site not specified: Secondary | ICD-10-CM | POA: Diagnosis present

## 2014-10-22 DIAGNOSIS — F0390 Unspecified dementia without behavioral disturbance: Secondary | ICD-10-CM | POA: Diagnosis present

## 2014-10-22 DIAGNOSIS — I480 Paroxysmal atrial fibrillation: Secondary | ICD-10-CM | POA: Diagnosis present

## 2014-10-22 DIAGNOSIS — M1990 Unspecified osteoarthritis, unspecified site: Secondary | ICD-10-CM | POA: Diagnosis present

## 2014-10-22 DIAGNOSIS — L03114 Cellulitis of left upper limb: Principal | ICD-10-CM | POA: Diagnosis present

## 2014-10-22 DIAGNOSIS — Z79899 Other long term (current) drug therapy: Secondary | ICD-10-CM

## 2014-10-22 DIAGNOSIS — N182 Chronic kidney disease, stage 2 (mild): Secondary | ICD-10-CM | POA: Diagnosis present

## 2014-10-22 DIAGNOSIS — N3000 Acute cystitis without hematuria: Secondary | ICD-10-CM

## 2014-10-22 DIAGNOSIS — M25532 Pain in left wrist: Secondary | ICD-10-CM | POA: Diagnosis present

## 2014-10-22 DIAGNOSIS — H3530 Unspecified macular degeneration: Secondary | ICD-10-CM | POA: Diagnosis present

## 2014-10-22 DIAGNOSIS — E43 Unspecified severe protein-calorie malnutrition: Secondary | ICD-10-CM

## 2014-10-22 DIAGNOSIS — H9190 Unspecified hearing loss, unspecified ear: Secondary | ICD-10-CM | POA: Diagnosis present

## 2014-10-22 DIAGNOSIS — D631 Anemia in chronic kidney disease: Secondary | ICD-10-CM | POA: Diagnosis present

## 2014-10-22 DIAGNOSIS — L02512 Cutaneous abscess of left hand: Secondary | ICD-10-CM | POA: Diagnosis present

## 2014-10-22 DIAGNOSIS — R52 Pain, unspecified: Secondary | ICD-10-CM

## 2014-10-22 DIAGNOSIS — L03119 Cellulitis of unspecified part of limb: Secondary | ICD-10-CM | POA: Diagnosis present

## 2014-10-22 DIAGNOSIS — R609 Edema, unspecified: Secondary | ICD-10-CM

## 2014-10-22 DIAGNOSIS — L02519 Cutaneous abscess of unspecified hand: Secondary | ICD-10-CM | POA: Diagnosis present

## 2014-10-22 LAB — URINE MICROSCOPIC-ADD ON

## 2014-10-22 LAB — COMPREHENSIVE METABOLIC PANEL
ALK PHOS: 73 U/L (ref 39–117)
ALT: 7 U/L (ref 0–35)
ANION GAP: 12 (ref 5–15)
AST: 11 U/L (ref 0–37)
Albumin: 2.5 g/dL — ABNORMAL LOW (ref 3.5–5.2)
BILIRUBIN TOTAL: 0.5 mg/dL (ref 0.3–1.2)
BUN: 18 mg/dL (ref 6–23)
CHLORIDE: 101 meq/L (ref 96–112)
CO2: 25 mEq/L (ref 19–32)
Calcium: 9 mg/dL (ref 8.4–10.5)
Creatinine, Ser: 0.58 mg/dL (ref 0.50–1.10)
GFR calc Af Amer: 90 mL/min — ABNORMAL LOW (ref >90)
GFR calc non Af Amer: 78 mL/min — ABNORMAL LOW (ref >90)
Glucose, Bld: 105 mg/dL — ABNORMAL HIGH (ref 70–99)
POTASSIUM: 4.3 meq/L (ref 3.7–5.3)
Sodium: 138 mEq/L (ref 137–147)
Total Protein: 6.1 g/dL (ref 6.0–8.3)

## 2014-10-22 LAB — URINALYSIS, ROUTINE W REFLEX MICROSCOPIC
BILIRUBIN URINE: NEGATIVE
GLUCOSE, UA: NEGATIVE mg/dL
KETONES UR: 40 mg/dL — AB
NITRITE: POSITIVE — AB
PH: 6 (ref 5.0–8.0)
PROTEIN: NEGATIVE mg/dL
Specific Gravity, Urine: 1.023 (ref 1.005–1.030)
Urobilinogen, UA: 1 mg/dL (ref 0.0–1.0)

## 2014-10-22 LAB — CBC WITH DIFFERENTIAL/PLATELET
Basophils Absolute: 0 10*3/uL (ref 0.0–0.1)
Basophils Relative: 0 % (ref 0–1)
Eosinophils Absolute: 0 10*3/uL (ref 0.0–0.7)
Eosinophils Relative: 1 % (ref 0–5)
HCT: 36.3 % (ref 36.0–46.0)
HEMOGLOBIN: 11.5 g/dL — AB (ref 12.0–15.0)
LYMPHS ABS: 0.9 10*3/uL (ref 0.7–4.0)
Lymphocytes Relative: 15 % (ref 12–46)
MCH: 28.3 pg (ref 26.0–34.0)
MCHC: 31.7 g/dL (ref 30.0–36.0)
MCV: 89.4 fL (ref 78.0–100.0)
MONOS PCT: 11 % (ref 3–12)
Monocytes Absolute: 0.7 10*3/uL (ref 0.1–1.0)
NEUTROS ABS: 4.5 10*3/uL (ref 1.7–7.7)
Neutrophils Relative %: 73 % (ref 43–77)
Platelets: 216 10*3/uL (ref 150–400)
RBC: 4.06 MIL/uL (ref 3.87–5.11)
RDW: 14.3 % (ref 11.5–15.5)
WBC: 6.1 10*3/uL (ref 4.0–10.5)

## 2014-10-22 LAB — URIC ACID: Uric Acid, Serum: 3.2 mg/dL (ref 2.4–7.0)

## 2014-10-22 LAB — SEDIMENTATION RATE: SED RATE: 43 mm/h — AB (ref 0–22)

## 2014-10-22 LAB — CG4 I-STAT (LACTIC ACID): LACTIC ACID, VENOUS: 0.54 mmol/L (ref 0.5–2.2)

## 2014-10-22 LAB — I-STAT CG4 LACTIC ACID, ED: Lactic Acid, Venous: 0.94 mmol/L (ref 0.5–2.2)

## 2014-10-22 MED ORDER — SODIUM CHLORIDE 0.9 % IV SOLN
INTRAVENOUS | Status: AC
  Administered 2014-10-22: 22:00:00 via INTRAVENOUS

## 2014-10-22 MED ORDER — CRANBERRY 250 MG PO CAPS: 500.0000 mg | ORAL_CAPSULE | Freq: Two times a day (BID) | ORAL | Status: DC

## 2014-10-22 MED ORDER — SODIUM CHLORIDE 0.9 % IJ SOLN
3.0000 mL | Freq: Two times a day (BID) | INTRAMUSCULAR | Status: DC
  Administered 2014-10-22 – 2014-10-27 (×5): 3 mL via INTRAVENOUS

## 2014-10-22 MED ORDER — CEFAZOLIN SODIUM 1-5 GM-% IV SOLN
1.0000 g | Freq: Two times a day (BID) | INTRAVENOUS | Status: DC
  Administered 2014-10-23 – 2014-10-25 (×5): 1 g via INTRAVENOUS
  Filled 2014-10-22 (×6): qty 50

## 2014-10-22 MED ORDER — CALCIUM CARBONATE-VITAMIN D 500-200 MG-UNIT PO TABS
1.0000 | ORAL_TABLET | Freq: Every day | ORAL | Status: DC
  Administered 2014-10-23 – 2014-10-27 (×5): 1 via ORAL
  Filled 2014-10-22 (×6): qty 1

## 2014-10-22 MED ORDER — ENOXAPARIN SODIUM 30 MG/0.3ML ~~LOC~~ SOLN
30.0000 mg | SUBCUTANEOUS | Status: DC
  Administered 2014-10-22 – 2014-10-26 (×4): 30 mg via SUBCUTANEOUS
  Filled 2014-10-22 (×7): qty 0.3

## 2014-10-22 MED ORDER — ENSURE COMPLETE PO LIQD
237.0000 mL | Freq: Three times a day (TID) | ORAL | Status: DC
  Administered 2014-10-22 – 2014-10-27 (×7): 237 mL via ORAL

## 2014-10-22 MED ORDER — TRAMADOL HCL 50 MG PO TABS
50.0000 mg | ORAL_TABLET | Freq: Once | ORAL | Status: AC
  Administered 2014-10-22: 50 mg via ORAL
  Filled 2014-10-22: qty 1

## 2014-10-22 MED ORDER — ONDANSETRON HCL 4 MG PO TABS
4.0000 mg | ORAL_TABLET | Freq: Four times a day (QID) | ORAL | Status: DC | PRN
Start: 2014-10-22 — End: 2014-10-27

## 2014-10-22 MED ORDER — ALUM & MAG HYDROXIDE-SIMETH 200-200-20 MG/5ML PO SUSP: 30.0000 mL | Freq: Four times a day (QID) | ORAL | Status: DC | PRN

## 2014-10-22 MED ORDER — SODIUM CHLORIDE 0.9 % IV SOLN: 250.0000 mL | INTRAVENOUS | Status: DC | PRN

## 2014-10-22 MED ORDER — ACETAMINOPHEN 650 MG RE SUPP: 650.0000 mg | Freq: Four times a day (QID) | RECTAL | Status: DC | PRN

## 2014-10-22 MED ORDER — ACETAMINOPHEN 325 MG PO TABS
650.0000 mg | ORAL_TABLET | Freq: Once | ORAL | Status: AC
  Administered 2014-10-22: 650 mg via ORAL
  Filled 2014-10-22: qty 2

## 2014-10-22 MED ORDER — OCUVITE PO TABS
1.0000 | ORAL_TABLET | Freq: Every day | ORAL | Status: DC
  Administered 2014-10-23 – 2014-10-27 (×4): 1 via ORAL
  Filled 2014-10-22 (×5): qty 1

## 2014-10-22 MED ORDER — SODIUM CHLORIDE 0.9 % IJ SOLN: 3.0000 mL | INTRAMUSCULAR | Status: DC | PRN

## 2014-10-22 MED ORDER — ACETAMINOPHEN 325 MG PO TABS
650.0000 mg | ORAL_TABLET | Freq: Four times a day (QID) | ORAL | Status: DC | PRN
  Administered 2014-10-24 – 2014-10-25 (×3): 650 mg via ORAL
  Filled 2014-10-22 (×3): qty 2

## 2014-10-22 MED ORDER — HYDROMORPHONE HCL 1 MG/ML IJ SOLN
0.5000 mg | INTRAMUSCULAR | Status: DC | PRN
  Administered 2014-10-24: 1 mg via INTRAVENOUS
  Filled 2014-10-22: qty 1

## 2014-10-22 MED ORDER — CEFAZOLIN SODIUM 1-5 GM-% IV SOLN
1.0000 g | Freq: Once | INTRAVENOUS | Status: AC
  Administered 2014-10-22: 1 g via INTRAVENOUS
  Filled 2014-10-22: qty 50

## 2014-10-22 MED ORDER — CALCIUM CARBONATE-VITAMIN D 600-400 MG-UNIT PO TABS: 1.0000 | ORAL_TABLET | Freq: Every day | ORAL | Status: DC

## 2014-10-22 MED ORDER — OXYCODONE HCL 5 MG PO TABS
5.0000 mg | ORAL_TABLET | ORAL | Status: DC | PRN
  Administered 2014-10-23 – 2014-10-24 (×6): 5 mg via ORAL
  Filled 2014-10-22 (×6): qty 1

## 2014-10-22 MED ORDER — ONDANSETRON HCL 4 MG/2ML IJ SOLN: 4.0000 mg | Freq: Four times a day (QID) | INTRAMUSCULAR | Status: DC | PRN

## 2014-10-22 NOTE — ED Notes (Signed)
Rad tech doing left wrist imaging with portable machine.

## 2014-10-22 NOTE — ED Provider Notes (Signed)
CSN: [id number]     Arrival date & time 10/22/14  1526 History   First MD Initiated Contact with Patient 10/22/14 1602     Chief Complaint  Patient presents with  . Wrist Pain     (Consider location/radiation/quality/duration/timing/severity/associated sxs/prior Treatment) Patient is a 80 y.o. female presenting with wrist pain. The history is provided by the patient and medical records. No language interpreter was used.  Wrist Pain Associated symptoms include arthralgias (left wrist).    Deborah GivensWilma L Gilbert is a 80 y.o. female  with a hx of harder hearing, macular degeneration, osteoarthritis presents to the Emergency Department complaining of gradual, persistent, progressively worsening pain to the left wrist beginning this morning with associated redness, increased warmth and tenderness.  Movement and palpation makes the pain worse and nothing makes it better. Patient is unable to tell whether or not she has tried any medications for it.  Patient rambling and unable to complete review of systems. Level V caveat for altered mental status.     Past Medical History  Diagnosis Date  . Painful respiration   . Effusion of lower leg joint   . Dysuria   . Family history of malignant neoplasm of gastrointestinal tract   . Diarrhea   . Painful respiration   . Effusion of lower leg joint   . HOH (hard of hearing)   . Osteoarthritis   . Macular degeneration    Past Surgical History  Procedure Laterality Date  . Hip sx-right  1993  . Child birth    . Catract surgery/bilateral    . Appendectomy     No family history on file. History  Substance Use Topics  . Smoking status: Unknown If Ever Smoked  . Smokeless tobacco: Not on file  . Alcohol Use: No   OB History    No data available     Review of Systems  Unable to perform ROS: Dementia  Musculoskeletal: Positive for arthralgias (left wrist).  Skin: Positive for color change.      Allergies  Famotidine; Pepcid; Tetracycline;  Tetracyclines & related; and Trimethoprim  Home Medications   Prior to Admission medications   Medication Sig Start Date End Date Taking? Authorizing Provider  beta carotene w/minerals (OCUVITE) tablet Take 1 tablet by mouth daily.   Yes Historical Provider, MD  Calcium Carbonate-Vitamin D (CALCIUM 600+D) 600-400 MG-UNIT per tablet Take 1 tablet by mouth daily.   Yes Historical Provider, MD  Cranberry 250 MG CAPS Take 500 mg by mouth 2 (two) times daily.   Yes Historical Provider, MD  magnesium hydroxide (MILK OF MAGNESIA) 400 MG/5ML suspension Take 15 mLs by mouth daily as needed for mild constipation. If no relief in 8 hours give 6oz of prune juice with milk of mag   Yes Historical Provider, MD  meclizine (ANTIVERT) 12.5 MG tablet Take 12.5 mg by mouth daily.    Yes Historical Provider, MD  feeding supplement, ENSURE COMPLETE, (ENSURE COMPLETE) LIQD Take 237 mLs by mouth 3 (three) times daily between meals. Patient not taking: Reported on 10/22/2014 08/20/14   Hollice EspySendil K Krishnan, MD  metoprolol tartrate (LOPRESSOR) 25 MG tablet Take 1 tablet (25 mg total) by mouth 2 (two) times daily. Patient not taking: Reported on 10/22/2014 08/20/14   Hollice EspySendil K Krishnan, MD   BP 139/79 mmHg  Pulse 93  Temp(Src) 98 F (36.7 C)  Resp 16  SpO2 100% Physical Exam  Constitutional: She appears well-developed and well-nourished. No distress.  Awake, alert, nontoxic appearance  HENT:  Head: Normocephalic and atraumatic.  Mouth/Throat: Oropharynx is clear and moist. No oropharyngeal exudate.  Eyes: Conjunctivae are normal. No scleral icterus.  Neck: Normal range of motion. Neck supple.  Cardiovascular: Normal rate, regular rhythm, normal heart sounds and intact distal pulses.   No murmur heard. Capillary refill less then 3 seconds  Pulmonary/Chest: Effort normal and breath sounds normal. No respiratory distress. She has no wheezes.  Equal chest expansion  Abdominal: Soft. Bowel sounds are normal. She  exhibits no mass. There is no tenderness. There is no rebound and no guarding.  Musculoskeletal: Normal range of motion. She exhibits tenderness. She exhibits no edema.  ROM: Slightly decreased range of motion of the left wrist secondary to pain Significant erythema and increased warmth of the dorsum of the left wrist surrounding ganglion cyst with streaking along the forearm; no significant swelling of the wrist  Neurological: She is alert. Coordination normal.  Sensation is intact in the bilateral upper extremities Bilaterally and equally weak grip strength at 4/5  Skin: Skin is warm and dry. She is not diaphoretic. There is erythema.  No tenting of the skin Skin tear to the left lower leg  Psychiatric: She has a normal mood and affect.  Nursing note and vitals reviewed.   ED Course  Procedures (including critical care time) Labs Review Labs Reviewed  CBC WITH DIFFERENTIAL - Abnormal; Notable for the following:    Hemoglobin 11.5 (*)    All other components within normal limits  COMPREHENSIVE METABOLIC PANEL - Abnormal; Notable for the following:    Glucose, Bld 105 (*)    Albumin 2.5 (*)    GFR calc non Af Amer 78 (*)    GFR calc Af Amer 90 (*)    All other components within normal limits  CULTURE, BLOOD (ROUTINE X 2)  CULTURE, BLOOD (ROUTINE X 2)  URINE CULTURE  URINALYSIS, ROUTINE W REFLEX MICROSCOPIC  SEDIMENTATION RATE  URIC ACID  I-STAT CG4 LACTIC ACID, ED    Imaging Review Dg Wrist Complete Left  10/22/2014   CLINICAL DATA:  Swollen and red wrist. No reported injury. Abnormality noted today.  EXAM: LEFT WRIST - COMPLETE 3+ VIEW  COMPARISON:  None.  FINDINGS: There is no evidence of fracture or dislocation. There is no evidence of erosive arthropathy or other focal bone abnormality. No periostitis. Cartilage calcification. Mild soft tissue swelling. Osteopenia.  IMPRESSION: No acute osseous findings.  Soft tissue swelling.   Electronically Signed   By: Davonna Belling M.D.    On: 10/22/2014 18:07   Dg Chest Port 1 View  10/22/2014   CLINICAL DATA:  Possible septic joint  EXAM: PORTABLE CHEST - 1 VIEW  COMPARISON:  Radiograph 08/18/2014  FINDINGS: Normal cardiac silhouette. Lungs are hyperinflated. This chronic bronchitic markings. No effusion, infiltrate, or pneumothorax.  IMPRESSION: Hyperinflated lungs.  No acute findings.   Electronically Signed   By: Genevive Bi M.D.   On: 10/22/2014 18:03     EKG Interpretation None      MDM   Final diagnoses:  Swelling  Pain  Cellulitis of wrist   Deborah Gilbert presents with left wrist that is warm and red with streaking up the forearm.  Clinically consistent with ganglion cyst that has become infected.  Will begin sepsis work-up and x-ray the wrist as pt cannot tell me if she has fallen.    6:24 PM Labs reassuring; no evidence of SIRS or sepsis. Patient's vital signs have remained stable and she has  remained afebrile. Labs without leukocytosis or elevated lactic acid.  Wrist x-ray without acute osseous findings including osteomyelitis or fracture.  Begun on Ancef here in the emergency department.  Uric acid and sedimentation rate pending.  6:45 PM Patient discussed with Dr. Benjamine MolaVann; she will be admitted to a MedSurg bed.  BP 139/79 mmHg  Pulse 93  Temp(Src) 98 F (36.7 C)  Resp 16  SpO2 100%   The patient was discussed with and seen by Dr. Donnald GarrePfeiffer who agrees with the treatment plan.   Dahlia ClientHannah Kimiyah Blick, PA-C 10/22/14 1846  Arby BarretteMarcy Pfeiffer, MD 10/22/14 2312

## 2014-10-22 NOTE — ED Notes (Addendum)
Pt arrived from [facility] with c/o left wrist pain that was noticed by staff this morning. Wrist is red, swollen and hot to touch. No injury to site.

## 2014-10-22 NOTE — Progress Notes (Signed)
PHARMACIST - PHYSICIAN ORDER COMMUNICATION  CONCERNING: P&T Medication Policy on Herbal Medications  DESCRIPTION:  This patient's order for:  Cranberry  has been noted.  This product(s) is classified as an "herbal" or natural product. Due to a lack of definitive safety studies or FDA approval, nonstandard manufacturing practices, plus the potential risk of unknown drug-drug interactions while on inpatient medications, the Pharmacy and Therapeutics Committee does not permit the use of "herbal" or natural products of this type within [facility].   ACTION TAKEN: The pharmacy department is unable to verify this order at this time. Please reevaluate patient's clinical condition at discharge and address if the herbal or natural product(s) should be resumed at that time.   Nicolette Bangheresa [place] Schweppe, RPh Pager: [phone] 10/22/2014 9:57 PM

## 2014-10-22 NOTE — H&P (Addendum)
[facility] Admission History and Physical       Deborah Gilbert ZOX:[id number] DOB: 1920-05-18 DOA: 10/22/2014  Referring physician: EDP PCP: Deborah Gilbert  Specialists:   Chief Complaint: Left Wrist Swelling and Pain  HPI: Deborah GivensWilma L Gilbert is a 80 y.o. female with a history of Osteoarthritis, Macular Degeneration, and Dementia who was sent from her SNF due to right wrist pain, redness and swelling.   She was evaluated in the ED and x-rays were performed of the Left Wrist which were negative for fracture, and blood cultures were sent and she was placed on IV Ancef.    She was also found to have a UTI and a Urine Culture was also sent.   SHe is unable to give a history due to Dementia.      Review of Systems: Unable to Obtain from the Patient  Past Medical History  Diagnosis Date  . Painful respiration   . Effusion of lower leg joint   . Dysuria   . Family history of malignant neoplasm of gastrointestinal tract   . Diarrhea   . Painful respiration   . Effusion of lower leg joint   . HOH (hard of hearing)   . Osteoarthritis   . Macular degeneration     Past Surgical History  Procedure Laterality Date  . Hip sx-right  1993  . Child birth    . Catract surgery/bilateral    . Appendectomy         Prior to Admission medications   Medication Sig Start Date End Date Taking? Authorizing Provider  beta carotene w/minerals (OCUVITE) tablet Take 1 tablet by mouth daily.   Yes Historical Provider, Gilbert  Calcium Carbonate-Vitamin D (CALCIUM 600+D) 600-400 MG-UNIT per tablet Take 1 tablet by mouth daily.   Yes Historical Provider, Gilbert  Cranberry 250 MG CAPS Take 500 mg by mouth 2 (two) times daily.   Yes Historical Provider, Gilbert  magnesium hydroxide (MILK OF MAGNESIA) 400 MG/5ML suspension Take 15 mLs by mouth daily as needed for mild constipation. If no relief in 8 hours give 6oz of prune juice with milk of mag   Yes Historical Provider, Gilbert  meclizine (ANTIVERT) 12.5  MG tablet Take 12.5 mg by mouth daily.    Yes Historical Provider, Gilbert  feeding supplement, ENSURE COMPLETE, (ENSURE COMPLETE) LIQD Take 237 mLs by mouth 3 (three) times daily between meals. Patient not taking: Reported on 10/22/2014 08/20/14   Hollice EspySendil K Krishnan, Gilbert  metoprolol tartrate (LOPRESSOR) 25 MG tablet Take 1 tablet (25 mg total) by mouth 2 (two) times daily. Patient not taking: Reported on 10/22/2014 08/20/14   Hollice EspySendil K Krishnan, Gilbert      Allergies  Allergen Reactions  . Famotidine     REACTION: "felt like a heart attack"  . Pepcid [Famotidine] Other (See Comments)    Called patient's nursing home and they do not have any reactions known  . Tetracycline     REACTION: Severe nausea and vomiting  . Tetracyclines & Related     Unknown   . Trimethoprim     Unknown      Social History:  reports that she does not drink alcohol or use illicit drugs. Her tobacco history is not on file.     No family history on file.     Physical Exam:  GEN:  Pleasant and Confused Elderly thin  80 y.o. Caucasian female examined  and in no acute distress; cooperative with exam Filed Vitals:   10/22/14  1800 10/22/14 1830 10/22/14 1900 10/22/14 1950  BP: 133/95 139/79 144/72 126/66  Pulse: 87 93 98 78  Temp:    98.3 F (36.8 C)  TempSrc:    Oral  Resp: 16 16 17 16   Weight:    40.325 kg (88 lb 14.4 oz)  SpO2: 99% 100% 96% 96%   Blood pressure 126/66, pulse 78, temperature 98.3 F (36.8 C), temperature source Oral, resp. rate 16, weight 40.325 kg (88 lb 14.4 oz), SpO2 96 %. PSYCH: She is alert and oriented x1; does not appear anxious does not appear depressed; affect is normal HEENT: Normocephalic and Atraumatic, Mucous membranes pink; PERRLA; EOM intact; Fundi:  Benign;  No scleral icterus, Nares: Patent, Oropharynx: Clear, Edentulous,    Neck:  FROM, No Cervical Lymphadenopathy nor Thyromegaly or Carotid Bruit; No JVD; Breasts:: Not examined CHEST WALL: No tenderness CHEST: Normal  respiration, clear to auscultation bilaterally HEART: Regular rate and rhythm; no murmurs rubs or gallops BACK: No kyphosis or scoliosis; No CVA tenderness ABDOMEN: Positive Bowel Sounds, Scaphoid, Soft Non-Tender; No Masses, No Organomegaly. Rectal Exam: Not done EXTREMITIES: No Cyanosis, Clubbing, or Edema; except for +Erythema of Left Wrist and Hand without  Ulcers. Genitalia: not examined PULSES: 2+ and symmetric SKIN: Normal hydration no rash or ulceration CNS:  Alert and Oriented x 1,  Vascular: pulses palpable throughout    Labs on Admission:  Basic Metabolic Panel:  Recent Labs Lab 10/22/14 1645  NA 138  K 4.3  CL 101  CO2 25  GLUCOSE 105*  BUN 18  CREATININE 0.58  CALCIUM 9.0   Liver Function Tests:  Recent Labs Lab 10/22/14 1645  AST 11  ALT 7  ALKPHOS 73  BILITOT 0.5  PROT 6.1  ALBUMIN 2.5*   No results for input(s): LIPASE, AMYLASE in the last 168 hours. No results for input(s): AMMONIA in the last 168 hours. CBC:  Recent Labs Lab 10/22/14 1645  WBC 6.1  NEUTROABS 4.5  HGB 11.5*  HCT 36.3  MCV 89.4  PLT 216   Cardiac Enzymes: No results for input(s): CKTOTAL, CKMB, CKMBINDEX, TROPONINI in the last 168 hours.  BNP (last 3 results) No results for input(s): PROBNP in the last 8760 hours. CBG: No results for input(s): GLUCAP in the last 168 hours.  Radiological Exams on Admission: Dg Wrist Complete Left  10/22/2014   CLINICAL DATA:  Swollen and red wrist. No reported injury. Abnormality noted today.  EXAM: LEFT WRIST - COMPLETE 3+ VIEW  COMPARISON:  None.  FINDINGS: There is no evidence of fracture or dislocation. There is no evidence of erosive arthropathy or other focal bone abnormality. No periostitis. Cartilage calcification. Mild soft tissue swelling. Osteopenia.  IMPRESSION: No acute osseous findings.  Soft tissue swelling.   Electronically Signed   By: Davonna BellingJohn  Curnes M.D.   On: 10/22/2014 18:07   Dg Chest Port 1 View  10/22/2014    CLINICAL DATA:  Possible septic joint  EXAM: PORTABLE CHEST - 1 VIEW  COMPARISON:  Radiograph 08/18/2014  FINDINGS: Normal cardiac silhouette. Lungs are hyperinflated. This chronic bronchitic markings. No effusion, infiltrate, or pneumothorax.  IMPRESSION: Hyperinflated lungs.  No acute findings.   Electronically Signed   By: Genevive BiStewart  Edmunds M.D.   On: 10/22/2014 18:03     Assessment/Plan:   80 y.o. female with   Principal Problem:   1.   Cellulitis of wrist/Cellulitis and abscess of hand   IV Ancef q 8hrs   Check Uric Acid LEvel  Active Problems:  2.   UTI (urinary tract infection)   Urine C+S Sent   IV Ancef    3.   PAF (paroxysmal atrial fibrillation)     4.   Protein-calorie malnutrition, severe   On Ensure TID with Meals     5.   Anemia of chronic renal failure, stage 2    Send Anemia Panel   Monitor Trend     6.   (mild)Dementia without behavioral disturbance   Chronic and Unchanged     7.   DVT Prophylaxis   Lovenox      Code Status:    FULL CODE   Family Communication:     Disposition Plan:    Med/Surg Bed   Time spent:  60 Minutes  Ron Parker [facility] Pager [phone]   If 7AM -7PM Please Contact the Day Rounding Team [facility]  If 7PM-7AM, Please Contact Night-Floor Coverage  [email or web address] Password [email or web address] 10/22/2014, 8:00 PM

## 2014-10-23 DIAGNOSIS — N390 Urinary tract infection, site not specified: Secondary | ICD-10-CM

## 2014-10-23 LAB — CBC
HCT: 33.9 % — ABNORMAL LOW (ref 36.0–46.0)
HEMOGLOBIN: 10.7 g/dL — AB (ref 12.0–15.0)
MCH: 28.5 pg (ref 26.0–34.0)
MCHC: 31.6 g/dL (ref 30.0–36.0)
MCV: 90.4 fL (ref 78.0–100.0)
PLATELETS: 208 10*3/uL (ref 150–400)
RBC: 3.75 MIL/uL — AB (ref 3.87–5.11)
RDW: 14.4 % (ref 11.5–15.5)
WBC: 6.9 10*3/uL (ref 4.0–10.5)

## 2014-10-23 LAB — BASIC METABOLIC PANEL
ANION GAP: 13 (ref 5–15)
BUN: 22 mg/dL (ref 6–23)
CHLORIDE: 103 meq/L (ref 96–112)
CO2: 22 mEq/L (ref 19–32)
Calcium: 8.6 mg/dL (ref 8.4–10.5)
Creatinine, Ser: 0.64 mg/dL (ref 0.50–1.10)
GFR, EST AFRICAN AMERICAN: 87 mL/min — AB (ref >90)
GFR, EST NON AFRICAN AMERICAN: 75 mL/min — AB (ref >90)
Glucose, Bld: 102 mg/dL — ABNORMAL HIGH (ref 70–99)
POTASSIUM: 4.2 meq/L (ref 3.7–5.3)
SODIUM: 138 meq/L (ref 137–147)

## 2014-10-23 NOTE — Progress Notes (Signed)
Utilization Review Completed.Deborah Gilbert T12/20/2015  

## 2014-10-23 NOTE — Progress Notes (Signed)
[facility] Progress Note   Deborah GivensWilma L Montefusco ZOX:[id number] DOB: 04-30-1920 DOA: 10/22/2014 PCP: Evette GeorgesDD,[name], MD  Brief narrative: Deborah Gilbert is a 80 y.o. female Deborah GivensWilma L Willy is a 80 y.o. female with a history of Osteoarthritis, Macular Degeneration, and Dementia who was sent from her SNF due to right wrist pain, redness and swelling. Admitted for treatment of cellulitis.    Subjective: Confused. No complaints.   Assessment/Plan: Principal Problem:   Cellulitis of wrist - cont Ancef  Active Problems:   UTI (urinary tract infection) - in Oct, grew out E coli sensitive to Ancef- continue Ancef    PAF (paroxysmal atrial fibrillation) - has not been placed on anticoagulation by cardiology - cont Lopressor    Dementia without behavioral disturbance - stable thus far in the hospital    Protein-calorie malnutrition, severe - cont ensure    Anemia of chronic renal failure, stage 2 (mild)  Code Status: Full code Family Communication:  Disposition Plan: [facility] when stable DVT prophylaxis: enoxaparin  Consultants: none  Procedures: none  Antibiotics: Anti-infectives    Start     Dose/Rate Route Frequency Ordered Stop   10/23/14 0600  ceFAZolin (ANCEF) IVPB 1 g/50 mL premix     1 g100 mL/hr over 30 Minutes Intravenous Every 12 hours 10/22/14 1957     10/22/14 1800  ceFAZolin (ANCEF) IVPB 1 g/50 mL premix     1 g100 mL/hr over 30 Minutes Intravenous  Once 10/22/14 1759 10/22/14 1850         Objective: Filed Weights   10/22/14 1950  Weight: 40.325 kg (88 lb 14.4 oz)    Intake/Output Summary (Last 24 hours) at 10/23/14 1606 Last data filed at 10/23/14 0635  Gross per 24 hour  Intake    405 ml  Output    100 ml  Net    305 ml     Vitals Filed Vitals:   10/22/14 1950 10/22/14 2100 10/23/14 0640 10/23/14 1021  BP: 126/66  142/84 110/47  Pulse: 78  99 88  Temp: 98.3 F (36.8 C)  98.3 F (36.8 C) 98.9 F (37.2 C)   TempSrc: Oral  Axillary Axillary  Resp: 16  17 18   Height:  5' (1.524 m)    Weight: 40.325 kg (88 lb 14.4 oz)     SpO2: 96%  94% 96%    Exam: General: alert but confused and not following commands. No acute respiratory distress Lungs: Clear to auscultation bilaterally without wheezes or crackles Cardiovascular: Regular rate and rhythm without murmur gallop or rub normal S1 and S2 Abdomen: Nontender, nondistended, soft, bowel sounds positive, no rebound, no ascites, no appreciable mass Extremities: No significant cyanosis, clubbing, or edema bilateral lower extremities Skin: left wrist and hand erythematous and swollen but not tender today  Data Reviewed: Basic Metabolic Panel:  Recent Labs Lab 10/22/14 1645 10/23/14 0508  NA 138 138  K 4.3 4.2  CL 101 103  CO2 25 22  GLUCOSE 105* 102*  BUN 18 22  CREATININE 0.58 0.64  CALCIUM 9.0 8.6   Liver Function Tests:  Recent Labs Lab 10/22/14 1645  AST 11  ALT 7  ALKPHOS 73  BILITOT 0.5  PROT 6.1  ALBUMIN 2.5*   No results for input(s): LIPASE, AMYLASE in the last 168 hours. No results for input(s): AMMONIA in the last 168 hours. CBC:  Recent Labs Lab 10/22/14 1645 10/23/14 0508  WBC 6.1 6.9  NEUTROABS 4.5  --  HGB 11.5* 10.7*  HCT 36.3 33.9*  MCV 89.4 90.4  PLT 216 208   Cardiac Enzymes: No results for input(s): CKTOTAL, CKMB, CKMBINDEX, TROPONINI in the last 168 hours. BNP (last 3 results) No results for input(s): PROBNP in the last 8760 hours. CBG: No results for input(s): GLUCAP in the last 168 hours.  Recent Results (from the past 240 hour(s))  Blood Culture (routine x 2)     Status: None (Preliminary result)   Collection Time: 10/22/14  4:39 PM  Result Value Ref Range Status   Specimen Description BLOOD LEFT ARM  Final   Special Requests BOTTLES DRAWN AEROBIC AND ANAEROBIC 5CC  Final   Culture  Setup Time   Final    10/22/2014 23:13 Performed at [facility]    Culture   Final            BLOOD CULTURE RECEIVED NO GROWTH TO DATE CULTURE WILL BE HELD FOR 5 DAYS BEFORE ISSUING A FINAL NEGATIVE REPORT Performed at [facility]    Report Status PENDING  Incomplete  Blood Culture (routine x 2)     Status: None (Preliminary result)   Collection Time: 10/22/14  4:45 PM  Result Value Ref Range Status   Specimen Description BLOOD LEFT FOREARM  Final   Special Requests BOTTLES DRAWN AEROBIC AND ANAEROBIC 5CC  Final   Culture  Setup Time   Final    10/22/2014 23:13 Performed at [facility]    Culture   Final           BLOOD CULTURE RECEIVED NO GROWTH TO DATE CULTURE WILL BE HELD FOR 5 DAYS BEFORE ISSUING A FINAL NEGATIVE REPORT Performed at [facility]    Report Status PENDING  Incomplete     Studies:  Recent x-ray studies have been reviewed in detail by the Attending Physician  Scheduled Meds:  Scheduled Meds: . beta carotene w/minerals  1 tablet Oral Daily  . calcium-vitamin D  1 tablet Oral Q breakfast  .  ceFAZolin (ANCEF) IV  1 g Intravenous Q12H  . enoxaparin (LOVENOX) injection  30 mg Subcutaneous Q24H  . feeding supplement (ENSURE COMPLETE)  237 mL Oral TID BM  . sodium chloride  3 mL Intravenous Q12H   Continuous Infusions:   Time spent on care of this patient: 35 min   Jackie Russman, MD 10/23/2014, 4:06 PM  LOS: 1 day   [facility] Office  [phone] Pager - Text Page per [email or web address]  If 7PM-7AM, please contact night-coverage [email or web address]

## 2014-10-24 DIAGNOSIS — L02519 Cutaneous abscess of unspecified hand: Secondary | ICD-10-CM

## 2014-10-24 MED ORDER — SODIUM CHLORIDE 0.9 % IV SOLN
INTRAVENOUS | Status: DC
  Administered 2014-10-24 – 2014-10-26 (×3): via INTRAVENOUS

## 2014-10-24 NOTE — Progress Notes (Signed)
[facility] Progress Note   Deborah Gilbert ZOX:[id number] DOB: 11-23-1919 DOA: 10/22/2014 PCP: Evette GeorgesDD,[name], MD  Brief narrative: Deborah GivensWilma L Gilbert is a 80 y.o. female Deborah Gilbert is a 80 y.o. female with a history of Osteoarthritis, Macular Degeneration, and Dementia who was sent from her SNF due to right wrist pain, redness and swelling. Admitted for treatment of cellulitis.    Subjective: Continues to be confused and at times agitated.  Assessment/Plan: Principal Problem:   Cellulitis of wrist - cont Ancef- no significant improvement as of yet  Active Problems:   UTI (urinary tract infection) - in Oct, grew out E coli sensitive to Ancef- gr neg rods growing now- continue Ancef    PAF (paroxysmal atrial fibrillation) - has not been placed on anticoagulation by cardiology - cont Lopressor    Dementia without behavioral disturbance - stable thus far in the hospital    Protein-calorie malnutrition, severe - cont ensure- PO intake has been poor- will start IVF    Anemia of chronic renal failure, stage 2 (mild)  Code Status: Full code Family Communication:  [facility] DVT prophylaxis: enoxaparin  Consultants: none  Procedures: none  Antibiotics: Anti-infectives    Start     Dose/Rate Route Frequency Ordered Stop   10/23/14 0600  ceFAZolin (ANCEF) IVPB 1 g/50 mL premix     1 g100 mL/hr over 30 Minutes Intravenous Every 12 hours 10/22/14 1957     10/22/14 1800  ceFAZolin (ANCEF) IVPB 1 g/50 mL premix     1 g100 mL/hr over 30 Minutes Intravenous  Once 10/22/14 1759 10/22/14 1850         Objective: Filed Weights   10/22/14 1950  Weight: 40.325 kg (88 lb 14.4 oz)    Intake/Output Summary (Last 24 hours) at 10/24/14 1510 Last data filed at 10/24/14 1410  Gross per 24 hour  Intake    110 ml  Output      0 ml  Net    110 ml     Vitals Filed Vitals:   10/23/14 2202 10/24/14 0615 10/24/14 0923  10/24/14 1410  BP: 136/66 115/58 116/59 142/61  Pulse: 86 75 73 81  Temp: 98.9 F (37.2 C) 98.1 F (36.7 C) 97.5 F (36.4 C) 97.7 F (36.5 C)  TempSrc: Axillary Axillary Oral Oral  Resp: 17 16 16 18   Height:      Weight:      SpO2: 95% 94% 96% 96%    Exam: General: alert but continues to be confused and not following commands. No acute respiratory distress Lungs: Clear to auscultation bilaterally without wheezes or crackles Cardiovascular: Regular rate and rhythm without murmur gallop or rub normal S1 and S2 Abdomen: Nontender, nondistended, soft, bowel sounds positive, no rebound, no ascites, no appreciable mass Extremities: No significant cyanosis, clubbing, or edema bilateral lower extremities Skin: left wrist and hand erythematous and swollen but not tender today  Data Reviewed: Basic Metabolic Panel:  Recent Labs Lab 10/22/14 1645 10/23/14 0508  NA 138 138  K 4.3 4.2  CL 101 103  CO2 25 22  GLUCOSE 105* 102*  BUN 18 22  CREATININE 0.58 0.64  CALCIUM 9.0 8.6   Liver Function Tests:  Recent Labs Lab 10/22/14 1645  AST 11  ALT 7  ALKPHOS 73  BILITOT 0.5  PROT 6.1  ALBUMIN 2.5*   No results for input(s): LIPASE, AMYLASE in the last 168 hours. No results for input(s): AMMONIA  in the last 168 hours. CBC:  Recent Labs Lab 10/22/14 1645 10/23/14 0508  WBC 6.1 6.9  NEUTROABS 4.5  --   HGB 11.5* 10.7*  HCT 36.3 33.9*  MCV 89.4 90.4  PLT 216 208   Cardiac Enzymes: No results for input(s): CKTOTAL, CKMB, CKMBINDEX, TROPONINI in the last 168 hours. BNP (last 3 results) No results for input(s): PROBNP in the last 8760 hours. CBG: No results for input(s): GLUCAP in the last 168 hours.  Recent Results (from the past 240 hour(s))  Blood Culture (routine x 2)     Status: None (Preliminary result)   Collection Time: 10/22/14  4:39 PM  Result Value Ref Range Status   Specimen Description BLOOD LEFT ARM  Final   Special Requests BOTTLES DRAWN AEROBIC AND  ANAEROBIC 5CC  Final   Culture  Setup Time   Final    10/22/2014 23:13 Performed at [facility]    Culture   Final           BLOOD CULTURE RECEIVED NO GROWTH TO DATE CULTURE WILL BE HELD FOR 5 DAYS BEFORE ISSUING A FINAL NEGATIVE REPORT Performed at [facility]    Report Status PENDING  Incomplete  Blood Culture (routine x 2)     Status: None (Preliminary result)   Collection Time: 10/22/14  4:45 PM  Result Value Ref Range Status   Specimen Description BLOOD LEFT FOREARM  Final   Special Requests BOTTLES DRAWN AEROBIC AND ANAEROBIC 5CC  Final   Culture  Setup Time   Final    10/22/2014 23:13 Performed at [facility]    Culture   Final           BLOOD CULTURE RECEIVED NO GROWTH TO DATE CULTURE WILL BE HELD FOR 5 DAYS BEFORE ISSUING A FINAL NEGATIVE REPORT Performed at [facility]    Report Status PENDING  Incomplete  Urine culture     Status: None (Preliminary result)   Collection Time: 10/22/14  6:01 PM  Result Value Ref Range Status   Specimen Description URINE, CATHETERIZED  Final   Special Requests NONE  Final   Culture  Setup Time   Final    10/23/2014 05:45 Performed at [facility]    Colony Count   Final    >=100,000 COLONIES/ML Performed at [facility]    Culture   Final    GRAM NEGATIVE RODS Performed at [facility]    Report Status PENDING  Incomplete     Studies:  Recent x-ray studies have been reviewed in detail by the Attending Physician  Scheduled Meds:  Scheduled Meds: . beta carotene w/minerals  1 tablet Oral Daily  . calcium-vitamin D  1 tablet Oral Q breakfast  .  ceFAZolin (ANCEF) IV  1 g Intravenous Q12H  . enoxaparin (LOVENOX) injection  30 mg Subcutaneous Q24H  . feeding supplement (ENSURE COMPLETE)  237 mL Oral TID BM  . sodium chloride  3 mL Intravenous Q12H   Continuous Infusions:   Time spent on care of this patient: 35 min   Akeia Perot, MD 10/24/2014, 3:10 PM   LOS: 2 days   [facility] Office  [phone] Pager - Text Page per [email or web address]  If 7PM-7AM, please contact night-coverage [email or web address]

## 2014-10-25 LAB — URINE CULTURE: Colony Count: >=100000

## 2014-10-25 LAB — BASIC METABOLIC PANEL
Anion gap: 6 (ref 5–15)
BUN: 19 mg/dL (ref 6–23)
CHLORIDE: 109 meq/L (ref 96–112)
CO2: 26 mmol/L (ref 19–32)
Calcium: 8.3 mg/dL — ABNORMAL LOW (ref 8.4–10.5)
Creatinine, Ser: 0.64 mg/dL (ref 0.50–1.10)
GFR calc Af Amer: 87 mL/min — ABNORMAL LOW (ref >90)
GFR calc non Af Amer: 75 mL/min — ABNORMAL LOW (ref >90)
GLUCOSE: 86 mg/dL (ref 70–99)
POTASSIUM: 4.1 mmol/L (ref 3.5–5.1)
SODIUM: 141 mmol/L (ref 135–145)

## 2014-10-25 MED ORDER — METOPROLOL TARTRATE 12.5 MG HALF TABLET
12.5000 mg | ORAL_TABLET | Freq: Two times a day (BID) | ORAL | Status: DC
  Administered 2014-10-25 – 2014-10-27 (×4): 12.5 mg via ORAL
  Filled 2014-10-25 (×6): qty 1

## 2014-10-25 MED ORDER — CEFTRIAXONE SODIUM IN DEXTROSE 20 MG/ML IV SOLN
1.0000 g | INTRAVENOUS | Status: DC
  Administered 2014-10-25 – 2014-10-26 (×2): 1 g via INTRAVENOUS
  Filled 2014-10-25 (×3): qty 50

## 2014-10-25 NOTE — Progress Notes (Signed)
[facility] Progress Note   Deborah GivensWilma L Swander UJW:[id number] DOB: [birth year] DOA: 10/22/2014 PCP: Evette GeorgesDD,[name], MD  Brief narrative: Deborah Gilbert is a 80 y.o. female Deborah GivensWilma L Perra is a 80 y.o. female with a history of Osteoarthritis, Macular Degeneration, and Dementia who was sent from her SNF due to right wrist pain, redness and swelling. Admitted for treatment of cellulitis.    Subjective: Continues to be confused and at times agitated.  Assessment/Plan: Principal Problem:   Cellulitis of wrist -Slow improvement -Will change to ceftriaxone since urine culture is growing Enterobacter which is resistant to cefazolin and sensitive to Ceftriaxone  Active Problems:   UTI (urinary tract infection) -Urine cultures growing Enterobacter, organism susceptible to ceftriaxone. -Will discontinue Ancef, start ceftriaxone gram IV every 24 hours    PAF (paroxysmal atrial fibrillation) - has not been placed on anticoagulation by cardiology -Rate controlled, telemetry of relieving heart rates mostly in the 80s -Cont Lopressor at 12.5 mg by mouth twice a day    Dementia without behavioral disturbance - stable    Protein-calorie malnutrition, severe - cont ensure    Anemia of chronic renal failure, stage 2 (mild)  Code Status: Full code Family Communication:  Disposition Plan: Anticipate discharge to skilled nursing facility in the next 24-48 hours DVT prophylaxis: enoxaparin  Consultants: none  Procedures: none  Antibiotics: Anti-infectives    Start     Dose/Rate Route Frequency Ordered Stop   10/25/14 1330  cefTRIAXone (ROCEPHIN) 1 g in dextrose 5 % 50 mL IVPB - Premix     1 g100 mL/hr over 30 Minutes Intravenous Every 24 hours 10/25/14 1315     10/23/14 0600  ceFAZolin (ANCEF) IVPB 1 g/50 mL premix  Status:  Discontinued     1 g100 mL/hr over 30 Minutes Intravenous Every 12 hours 10/22/14 1957 10/25/14 1315   10/22/14 1800  ceFAZolin (ANCEF) IVPB 1 g/50 mL  premix     1 g100 mL/hr over 30 Minutes Intravenous  Once 10/22/14 1759 10/22/14 1850         Objective: Filed Weights   10/22/14 1950  Weight: 40.325 kg (88 lb 14.4 oz)    Intake/Output Summary (Last 24 hours) at 10/25/14 1535 Last data filed at 10/25/14 1411  Gross per 24 hour  Intake    639 ml  Output      0 ml  Net    639 ml     Vitals Filed Vitals:   10/24/14 2114 10/25/14 0630 10/25/14 1031 10/25/14 1401  BP: 141/69 150/91 117/51 110/59  Pulse: 82 79 85 83  Temp:  98.6 F (37 C) 98.9 F (37.2 C)   TempSrc: Axillary Axillary Oral   Resp: 19 19 17 17   Height:      Weight:      SpO2: 93% 94% 98% 98%    Exam: General: alert but continues to be confused and not following commands. No acute respiratory distress Lungs: Clear to auscultation bilaterally without wheezes or crackles Cardiovascular: Regular rate and rhythm without murmur gallop or rub normal S1 and S2 Abdomen: Nontender, nondistended, soft, bowel sounds positive, no rebound, no ascites, no appreciable mass Extremities: No significant cyanosis, clubbing, or edema bilateral lower extremities Skin: left wrist and hand erythematous and swollen but not tender today  Data Reviewed: Basic Metabolic Panel:  Recent Labs Lab 10/22/14 1645 10/23/14 0508 10/25/14 0432  NA 138 138 141  K 4.3 4.2 4.1  CL 101 103 109  CO2 25 22 26  GLUCOSE 105* 102* 86  BUN 18 22 19   CREATININE 0.58 0.64 0.64  CALCIUM 9.0 8.6 8.3*   Liver Function Tests:  Recent Labs Lab 10/22/14 1645  AST 11  ALT 7  ALKPHOS 73  BILITOT 0.5  PROT 6.1  ALBUMIN 2.5*   No results for input(s): LIPASE, AMYLASE in the last 168 hours. No results for input(s): AMMONIA in the last 168 hours. CBC:  Recent Labs Lab 10/22/14 1645 10/23/14 0508  WBC 6.1 6.9  NEUTROABS 4.5  --   HGB 11.5* 10.7*  HCT 36.3 33.9*  MCV 89.4 90.4  PLT 216 208   Cardiac Enzymes: No results for input(s): CKTOTAL, CKMB, CKMBINDEX, TROPONINI in the  last 168 hours. BNP (last 3 results) No results for input(s): PROBNP in the last 8760 hours. CBG: No results for input(s): GLUCAP in the last 168 hours.  Recent Results (from the past 240 hour(s))  Blood Culture (routine x 2)     Status: None (Preliminary result)   Collection Time: 10/22/14  4:39 PM  Result Value Ref Range Status   Specimen Description BLOOD LEFT ARM  Final   Special Requests BOTTLES DRAWN AEROBIC AND ANAEROBIC 5CC  Final   Culture  Setup Time   Final    10/22/2014 23:13 Performed at [facility]    Culture   Final           BLOOD CULTURE RECEIVED NO GROWTH TO DATE CULTURE WILL BE HELD FOR 5 DAYS BEFORE ISSUING A FINAL NEGATIVE REPORT Performed at [facility]    Report Status PENDING  Incomplete  Blood Culture (routine x 2)     Status: None (Preliminary result)   Collection Time: 10/22/14  4:45 PM  Result Value Ref Range Status   Specimen Description BLOOD LEFT FOREARM  Final   Special Requests BOTTLES DRAWN AEROBIC AND ANAEROBIC 5CC  Final   Culture  Setup Time   Final    10/22/2014 23:13 Performed at [facility]    Culture   Final           BLOOD CULTURE RECEIVED NO GROWTH TO DATE CULTURE WILL BE HELD FOR 5 DAYS BEFORE ISSUING A FINAL NEGATIVE REPORT Performed at [facility]    Report Status PENDING  Incomplete  Urine culture     Status: None   Collection Time: 10/22/14  6:01 PM  Result Value Ref Range Status   Specimen Description URINE, CATHETERIZED  Final   Special Requests NONE  Final   Culture  Setup Time   Final    10/23/2014 05:45 Performed at [facility] Count   Final    >=100,000 COLONIES/ML Performed at [facility]    Culture   Final    ENTEROBACTER CLOACAE Performed at [facility]    Report Status 10/25/2014 FINAL  Final   Organism ID, Bacteria ENTEROBACTER CLOACAE  Final      Susceptibility   Enterobacter cloacae - MIC*    CEFAZOLIN >=64 RESISTANT Resistant      CEFTRIAXONE <=1 SENSITIVE Sensitive     CIPROFLOXACIN <=0.25 SENSITIVE Sensitive     GENTAMICIN <=1 SENSITIVE Sensitive     LEVOFLOXACIN 0.5 SENSITIVE Sensitive     NITROFURANTOIN 32 SENSITIVE Sensitive     TOBRAMYCIN <=1 SENSITIVE Sensitive     TRIMETH/SULFA 40 SENSITIVE Sensitive     PIP/TAZO <=4 SENSITIVE Sensitive     * ENTEROBACTER CLOACAE     Studies:  Recent x-ray studies have been reviewed in detail by the Attending Physician  Scheduled Meds:  Scheduled Meds: . beta carotene w/minerals  1 tablet Oral Daily  . calcium-vitamin D  1 tablet Oral Q breakfast  . cefTRIAXone (ROCEPHIN)  IV  1 g Intravenous Q24H  . enoxaparin (LOVENOX) injection  30 mg Subcutaneous Q24H  . feeding supplement (ENSURE COMPLETE)  237 mL Oral TID BM  . metoprolol tartrate  12.5 mg Oral BID  . sodium chloride  3 mL Intravenous Q12H   Continuous Infusions: . sodium chloride 75 mL/hr at 10/25/14 1124    Time spent on care of this patient: 35 min   Jeralyn BennettZAMORA, Anique Beckley, MD 10/25/2014, 3:35 PM  LOS: 3 days   [facility] Office  [phone] Pager - Text Page per [email or web address]  If 7PM-7AM, please contact night-coverage [email or web address]

## 2014-10-25 NOTE — Progress Notes (Deleted)
[facility] Progress Note   Deborah Gilbert ZOX:[id number] DOB: [birth year] DOA: 10/22/2014 PCP: Evette Georges, MD  Brief narrative: Deborah Gilbert is a 80 y.o. female Deborah Gilbert is a 80 y.o. female with a history of Osteoarthritis, Macular Degeneration, and Dementia who was sent from her SNF due to right wrist pain, redness and swelling. Admitted for treatment of cellulitis.    Subjective: Continues to be confused and at times agitated.  Assessment/Plan: Principal Problem:   Cellulitis of wrist -Slow improvement, will add vancomycin -Will change to ceftriaxone since urine culture is growing Enterobacter which is resistant to cefazolin and sensitive to Ceftriaxone  Active Problems:   UTI (urinary tract infection) -Urine cultures growing Enterobacter, organism susceptible to ceftriaxone. -Will discontinue Ancef, start ceftriaxone gram IV every 24 hours    PAF (paroxysmal atrial fibrillation) - has not been placed on anticoagulation by cardiology -Rate controlled, telemetry of relieving heart rates mostly in the 80s -Cont Lopressor at 12.5 mg by mouth twice a day    Dementia without behavioral disturbance - stable    Protein-calorie malnutrition, severe - cont ensure    Anemia of chronic renal failure, stage 2 (mild)  Code Status: Full code Family Communication:  Disposition Plan: Anticipate discharge to skilled nursing facility in the next 24-48 hours DVT prophylaxis: enoxaparin  Consultants: none  Procedures: none  Antibiotics: Anti-infectives    Start     Dose/Rate Route Frequency Ordered Stop   10/25/14 1330  cefTRIAXone (ROCEPHIN) 1 g in dextrose 5 % 50 mL IVPB - Premix     1 g100 mL/hr over 30 Minutes Intravenous Every 24 hours 10/25/14 1315     10/23/14 0600  ceFAZolin (ANCEF) IVPB 1 g/50 mL premix  Status:  Discontinued     1 g100 mL/hr over 30 Minutes Intravenous Every 12 hours 10/22/14 1957 10/25/14 1315   10/22/14 1800  ceFAZolin (ANCEF)  IVPB 1 g/50 mL premix     1 g100 mL/hr over 30 Minutes Intravenous  Once 10/22/14 1759 10/22/14 1850         Objective: Filed Weights   10/22/14 1950  Weight: 40.325 kg (88 lb 14.4 oz)    Intake/Output Summary (Last 24 hours) at 10/25/14 1526 Last data filed at 10/25/14 1411  Gross per 24 hour  Intake    639 ml  Output      0 ml  Net    639 ml     Vitals Filed Vitals:   10/24/14 2114 10/25/14 0630 10/25/14 1031 10/25/14 1401  BP: 141/69 150/91 117/51 110/59  Pulse: 82 79 85 83  Temp:  98.6 F (37 C) 98.9 F (37.2 C)   TempSrc: Axillary Axillary Oral   Resp: 19 19 17 17   Height:      Weight:      SpO2: 93% 94% 98% 98%    Exam: General: alert but continues to be confused and not following commands. No acute respiratory distress Lungs: Clear to auscultation bilaterally without wheezes or crackles Cardiovascular: Regular rate and rhythm without murmur gallop or rub normal S1 and S2 Abdomen: Nontender, nondistended, soft, bowel sounds positive, no rebound, no ascites, no appreciable mass Extremities: No significant cyanosis, clubbing, or edema bilateral lower extremities Skin: left wrist and hand erythematous and swollen but not tender today  Data Reviewed: Basic Metabolic Panel:  Recent Labs Lab 10/22/14 1645 10/23/14 0508 10/25/14 0432  NA 138 138 141  K 4.3 4.2 4.1  CL 101 103 109  CO2 25  22 26  GLUCOSE 105* 102* 86  BUN 18 22 19   CREATININE 0.58 0.64 0.64  CALCIUM 9.0 8.6 8.3*   Liver Function Tests:  Recent Labs Lab 10/22/14 1645  AST 11  ALT 7  ALKPHOS 73  BILITOT 0.5  PROT 6.1  ALBUMIN 2.5*   No results for input(s): LIPASE, AMYLASE in the last 168 hours. No results for input(s): AMMONIA in the last 168 hours. CBC:  Recent Labs Lab 10/22/14 1645 10/23/14 0508  WBC 6.1 6.9  NEUTROABS 4.5  --   HGB 11.5* 10.7*  HCT 36.3 33.9*  MCV 89.4 90.4  PLT 216 208   Cardiac Enzymes: No results for input(s): CKTOTAL, CKMB, CKMBINDEX,  TROPONINI in the last 168 hours. BNP (last 3 results) No results for input(s): PROBNP in the last 8760 hours. CBG: No results for input(s): GLUCAP in the last 168 hours.  Recent Results (from the past 240 hour(s))  Blood Culture (routine x 2)     Status: None (Preliminary result)   Collection Time: 10/22/14  4:39 PM  Result Value Ref Range Status   Specimen Description BLOOD LEFT ARM  Final   Special Requests BOTTLES DRAWN AEROBIC AND ANAEROBIC 5CC  Final   Culture  Setup Time   Final    10/22/2014 23:13 Performed at [facility]    Culture   Final           BLOOD CULTURE RECEIVED NO GROWTH TO DATE CULTURE WILL BE HELD FOR 5 DAYS BEFORE ISSUING A FINAL NEGATIVE REPORT Performed at [facility]    Report Status PENDING  Incomplete  Blood Culture (routine x 2)     Status: None (Preliminary result)   Collection Time: 10/22/14  4:45 PM  Result Value Ref Range Status   Specimen Description BLOOD LEFT FOREARM  Final   Special Requests BOTTLES DRAWN AEROBIC AND ANAEROBIC 5CC  Final   Culture  Setup Time   Final    10/22/2014 23:13 Performed at [facility]    Culture   Final           BLOOD CULTURE RECEIVED NO GROWTH TO DATE CULTURE WILL BE HELD FOR 5 DAYS BEFORE ISSUING A FINAL NEGATIVE REPORT Performed at [facility]    Report Status PENDING  Incomplete  Urine culture     Status: None   Collection Time: 10/22/14  6:01 PM  Result Value Ref Range Status   Specimen Description URINE, CATHETERIZED  Final   Special Requests NONE  Final   Culture  Setup Time   Final    10/23/2014 05:45 [facility]formed at [facility]    Colony Count   Final    >=100,000 COLONIES/ML Performed at [facility]    Culture   Final    ENTEROBACTER CLOACAE Performed at [facility]    Report Status 10/25/2014 FINAL  Final   Organism ID, Bacteria ENTEROBACTER CLOACAE  Final      Susceptibility   Enterobacter cloacae - MIC*    CEFAZOLIN >=64  RESISTANT Resistant     CEFTRIAXONE <=1 SENSITIVE Sensitive     CIPROFLOXACIN <=0.25 SENSITIVE Sensitive     GENTAMICIN <=1 SENSITIVE Sensitive     LEVOFLOXACIN 0.5 SENSITIVE Sensitive     NITROFURANTOIN 32 SENSITIVE Sensitive     TOBRAMYCIN <=1 SENSITIVE Sensitive     TRIMETH/SULFA 40 SENSITIVE Sensitive     PIP/TAZO <=4 SENSITIVE Sensitive     * ENTEROBACTER CLOACAE  Studies:  Recent x-ray studies have been reviewed in detail by the Attending Physician  Scheduled Meds:  Scheduled Meds: . beta carotene w/minerals  1 tablet Oral Daily  . calcium-vitamin D  1 tablet Oral Q breakfast  . cefTRIAXone (ROCEPHIN)  IV  1 g Intravenous Q24H  . enoxaparin (LOVENOX) injection  30 mg Subcutaneous Q24H  . feeding supplement (ENSURE COMPLETE)  237 mL Oral TID BM  . sodium chloride  3 mL Intravenous Q12H   Continuous Infusions: . sodium chloride 75 mL/hr at 10/25/14 1124    Time spent on care of this patient: 35 min   Jeralyn BennettZAMORA, Lafreda Casebeer, MD 10/25/2014, 3:26 PM  LOS: 3 days   [facility] Office  [phone] Pager - Text Page per [email or web address]  If 7PM-7AM, please contact night-coverage [email or web address]

## 2014-10-25 NOTE — Progress Notes (Signed)
UR completed.  Pt admitted from SNF and unit CSW is aware. Plans to fax clinical info to SNF today for review for pt to be able to transfer back when medically stable. Pt has no family (her child is deceased).  SNF acts as her family for decisions.   Carlyle LipaMichelle Carless Slatten, RN BSN MHA CCM Trauma/Neuro ICU Case Manager [phone]

## 2014-10-25 NOTE — Clinical Social Work Psychosocial (Signed)
Clinical Social Work Department BRIEF PSYCHOSOCIAL ASSESSMENT 10/25/2014  Patient:  Deborah Gilbert,Deborah Gilbert     Account Number:  [id number]     Admit date:  03/30/2014  Clinical Social Worker:  Mosie EpsteinVAUGHN,Zekiel Torian S, LCSWA  Date/Time:  10/25/2014 02:34 PM  Referred by:  Physician  Date Referred:  10/25/2014 Referred for  SNF Placement   Other Referral:   none.   Interview type:  Other - See comment Other interview type:   [facility]. [facility], pt has no family (pt's only son is deceased) and [facility] cares for pt.    PSYCHOSOCIAL DATA Living Status:  FACILITY Admitted from facility:  [facility] Level of care:  Assisted Living Primary support name:  [facility] Primary support relationship to patient:  NONE Degree of support available:   [facility] states the employees of [facility] are pt's family and support system.    CURRENT CONCERNS Current Concerns  Post-Acute Placement   Other Concerns:   none.    SOCIAL WORK ASSESSMENT / PLAN CSW received [facility] and to return once medically stable for discharge. CSW contacted [facility] regarding contacting pt's next of kin as pt presents as oriented to person only. [facility], pt's only family (son) is deceased and [facility] cares for pt as patient is a long-term resident (3+ years).    [facility]F to review pt's clinical information and admit pt back to facility once medically stable for discharge. CSW to continue to follow and assist with discharge planning needs.   Assessment/plan status:  Psychosocial Support/Ongoing Assessment of Needs Other assessment/ plan:   none.   Information/referral to community resources:   [facility] once medically stable for discharge.    PATIENT'S/FAMILY'S RESPONSE TO PLAN OF CARE: [facility] admissions liaison understanding and agreeable to CSW plan of care and expressed no further questions or concerns at this time.       Marcelline Deistmily Nils Thor, LCSWA [phone]) Licensed Clinical Social Worker Orthopedics [phone]) and Surgical [phone]([id number])

## 2014-10-26 ENCOUNTER — Encounter (HOSPITAL_COMMUNITY): Payer: Self-pay | Admitting: *Deleted

## 2014-10-26 NOTE — Progress Notes (Signed)
[facility] Progress Note   SHANVI MOYD WUJ:[id number] DOB: Mar 10, 1920 DOA: 10/22/2014 PCP: Evette Georges, MD  Brief narrative: Deborah Gilbert is a 80 y.o. female JAVIER MAMONE is a 80 y.o. female with a history of Osteoarthritis, Macular Degeneration, and Dementia who was sent from her SNF due to right wrist pain, redness and swelling. Admitted for treatment of cellulitis.    Subjective: Continues to be confused and disoriented  Assessment/Plan: Principal Problem:   Cellulitis of wrist -Slow improvement -Changed to ceftriaxone since urine culture was growing Enterobacter which is resistant to cefazolin and sensitive to Ceftriaxone -Plan to change to oral antibiotic therapy in the next 24 hours  Active Problems:   UTI (urinary tract infection) -Urine cultures growing Enterobacter, organism susceptible to ceftriaxone. -Started ceftriaxone gram IV every 24 hours    PAF (paroxysmal atrial fibrillation) -Rate controlled, telemetry of relieving heart rates mostly in the 80s -Cont Lopressor at 12.5 mg by mouth twice a day    Dementia without behavioral disturbance - stable    Protein-calorie malnutrition, severe - cont ensure    Anemia of chronic renal failure, stage 2 (mild)  Code Status: Full code Family Communication:  Disposition Plan: Anticipate discharge to skilled nursing facility in the next 24 hours DVT prophylaxis: enoxaparin  Consultants: none  Procedures: none  Antibiotics: Anti-infectives    Start     Dose/Rate Route Frequency Ordered Stop   10/25/14 1330  cefTRIAXone (ROCEPHIN) 1 g in dextrose 5 % 50 mL IVPB - Premix     1 g100 mL/hr over 30 Minutes Intravenous Every 24 hours 10/25/14 1315     10/23/14 0600  ceFAZolin (ANCEF) IVPB 1 g/50 mL premix  Status:  Discontinued     1 g100 mL/hr over 30 Minutes Intravenous Every 12 hours 10/22/14 1957 10/25/14 1315   10/22/14 1800  ceFAZolin (ANCEF) IVPB 1 g/50 mL premix     1 g100 mL/hr over  30 Minutes Intravenous  Once 10/22/14 1759 10/22/14 1850         Objective: Filed Weights   10/22/14 1950  Weight: 40.325 kg (88 lb 14.4 oz)    Intake/Output Summary (Last 24 hours) at 10/26/14 1345 Last data filed at 10/26/14 0949  Gross per 24 hour  Intake   1834 ml  Output      0 ml  Net   1834 ml     Vitals Filed Vitals:   10/25/14 1847 10/25/14 2100 10/26/14 0458 10/26/14 1311  BP: 110/52 118/56 136/90 148/96  Pulse: 77 75 89 69  Temp: 97 F (36.1 C) 97.2 F (36.2 C) 97.6 F (36.4 C) 97.8 F (36.6 C)  TempSrc: Axillary Axillary Axillary Axillary  Resp: 18 18 15 17   Height:      Weight:      SpO2: 98% 97% 91% 97%    Exam: General: alert but continues to be confused and not following commands. No acute respiratory distress Lungs: Clear to auscultation bilaterally without wheezes or crackles Cardiovascular: Regular rate and rhythm without murmur gallop or rub normal S1 and S2 Abdomen: Nontender, nondistended, soft, bowel sounds positive, no rebound, no ascites, no appreciable mass Extremities: No significant cyanosis, clubbing, or edema bilateral lower extremities Skin: left wrist and hand erythematous and swollen but not tender today  Data Reviewed: Basic Metabolic Panel:  Recent Labs Lab 10/22/14 1645 10/23/14 0508 10/25/14 0432  NA 138 138 141  K 4.3 4.2 4.1  CL 101 103 109  CO2 25 22 26  GLUCOSE 105* 102* 86  BUN 18 22 19   CREATININE 0.58 0.64 0.64  CALCIUM 9.0 8.6 8.3*   Liver Function Tests:  Recent Labs Lab 10/22/14 1645  AST 11  ALT 7  ALKPHOS 73  BILITOT 0.5  PROT 6.1  ALBUMIN 2.5*   No results for input(s): LIPASE, AMYLASE in the last 168 hours. No results for input(s): AMMONIA in the last 168 hours. CBC:  Recent Labs Lab 10/22/14 1645 10/23/14 0508  WBC 6.1 6.9  NEUTROABS 4.5  --   HGB 11.5* 10.7*  HCT 36.3 33.9*  MCV 89.4 90.4  PLT 216 208   Cardiac Enzymes: No results for input(s): CKTOTAL, CKMB, CKMBINDEX,  TROPONINI in the last 168 hours. BNP (last 3 results) No results for input(s): PROBNP in the last 8760 hours. CBG: No results for input(s): GLUCAP in the last 168 hours.  Recent Results (from the past 240 hour(s))  Blood Culture (routine x 2)     Status: None (Preliminary result)   Collection Time: 10/22/14  4:39 PM  Result Value Ref Range Status   Specimen Description BLOOD LEFT ARM  Final   Special Requests BOTTLES DRAWN AEROBIC AND ANAEROBIC 5CC  Final   Culture  Setup Time   Final    10/22/2014 23:13 Performed at [facility]    Culture   Final           BLOOD CULTURE RECEIVED NO GROWTH TO DATE CULTURE WILL BE HELD FOR 5 DAYS BEFORE ISSUING A FINAL NEGATIVE REPORT Performed at [facility]    Report Status PENDING  Incomplete  Blood Culture (routine x 2)     Status: None (Preliminary result)   Collection Time: 10/22/14  4:45 PM  Result Value Ref Range Status   Specimen Description BLOOD LEFT FOREARM  Final   Special Requests BOTTLES DRAWN AEROBIC AND ANAEROBIC 5CC  Final   Culture  Setup Time   Final    10/22/2014 23:13 Performed at [facility]    Culture   Final           BLOOD CULTURE RECEIVED NO GROWTH TO DATE CULTURE WILL BE HELD FOR 5 DAYS BEFORE ISSUING A FINAL NEGATIVE REPORT Performed at [facility]    Report Status PENDING  Incomplete  Urine culture     Status: None   Collection Time: 10/22/14  6:01 PM  Result Value Ref Range Status   Specimen Description URINE, CATHETERIZED  Final   Special Requests NONE  Final   Culture  Setup Time   Final    10/23/2014 05:45 Performed at [facility]    Colony Count   Final    >=100,000 COLONIES/ML Performed at [facility]    Culture   Final    ENTEROBACTER CLOACAE Performed at [facility]    Report Status 10/25/2014 FINAL  Final   Organism ID, Bacteria ENTEROBACTER CLOACAE  Final      Susceptibility   Enterobacter cloacae - MIC*    CEFAZOLIN >=64  RESISTANT Resistant     CEFTRIAXONE <=1 SENSITIVE Sensitive     CIPROFLOXACIN <=0.25 SENSITIVE Sensitive     GENTAMICIN <=1 SENSITIVE Sensitive     LEVOFLOXACIN 0.5 SENSITIVE Sensitive     NITROFURANTOIN 32 SENSITIVE Sensitive     TOBRAMYCIN <=1 SENSITIVE Sensitive     TRIMETH/SULFA 40 SENSITIVE Sensitive     PIP/TAZO <=4 SENSITIVE Sensitive     * ENTEROBACTER CLOACAE     Studies:  Recent x-ray studies have been reviewed in detail by the Attending Physician  Scheduled Meds:  Scheduled Meds: . beta carotene w/minerals  1 tablet Oral Daily  . calcium-vitamin D  1 tablet Oral Q breakfast  . cefTRIAXone (ROCEPHIN)  IV  1 g Intravenous Q24H  . enoxaparin (LOVENOX) injection  30 mg Subcutaneous Q24H  . feeding supplement (ENSURE COMPLETE)  237 mL Oral TID BM  . metoprolol tartrate  12.5 mg Oral BID  . sodium chloride  3 mL Intravenous Q12H   Continuous Infusions:    Time spent on care of this patient: 35 min   Jeralyn BennettZAMORA, Tatym Schermer, MD 10/26/2014, 1:45 PM  LOS: 4 days   [facility] Office  [phone] Pager - Text Page per [email or web address]  If 7PM-7AM, please contact night-coverage [email or web address]

## 2014-10-26 NOTE — Progress Notes (Signed)
INITIAL NUTRITION ASSESSMENT  DOCUMENTATION CODES Per approved criteria  -Severe malnutrition in the context of chronic illness   Pt meets criteria for severe MALNUTRITION in the context of chronic illness as evidenced by severe fat and muscle depletion, 10.6% wt loss x 6 months.   INTERVENTION: -Continue Ensure Complete po TID, each supplement provides 350 kcal and 13 grams of protein  NUTRITION DIAGNOSIS: Malnutrition related to ongoing inadequate oral intake as evidenced by severe fat and muscle depletion, 10.6% wt loss x 6 months.   Goal: Pt will meet >90% of estimated nutritional needs  Monitor:  PO/supplement intake, labs, weight changes, I/O's  Reason for Assessment: MST=3, Low braden  80 y.o. female  Admitting Dx: Cellulitis of wrist  Deborah Gilbert is a 80 y.o. female with a history of Osteoarthritis, Macular Degeneration, and Dementia who was sent from her SNF due to right wrist pain, redness and swelling. She was evaluated in the ED and x-rays were performed of the Left Wrist which were negative for fracture, and blood cultures were sent and she was placed on IV Ancef. She was also found to have a UTI and a Urine Culture was also sent. SHe is unable to give a history due to Dementia.   ASSESSMENT: [facility] who has been admitted for cellulitis of the wrist.  Pt is unable to provide any hx due to cognitive impairment. Intake has been poor and suspect this is an ongoing issue. Spoke with nurse tech who reports that pt spits her food out when attempting to feed her. Noted 10-25% meal completion. Wt hx reveals a 10.2% wt loss x 6 months.  Noted two containers of Ensure at bedside. Nurse tech reports they have not attempted to give any to her yet. Chart review reveals pt was receiving Ensure TID PTA. Will continue supplement.  Labs reviewed. Calcium: 8.3.   Nutrition Focused Physical Exam:  Subcutaneous Fat:  Orbital Region: severe  depletion Upper Arm Region: severe depletion Thoracic and Lumbar Region: severe depletion  Muscle:  Temple Region: severe depletion Clavicle Bone Region: severe depletion Clavicle and Acromion Bone Region: severe depletion Scapular Bone Region: severe depletion Dorsal Hand: severe depletion Patellar Region: severe depletion Anterior Thigh Region: severe depletion Posterior Calf Region: severe depletion  Edema: none present   Height: Ht Readings from Last 1 Encounters:  10/22/14 5' (1.524 m)    Weight: Wt Readings from Last 1 Encounters:  10/22/14 88 lb 14.4 oz (40.325 kg)    Ideal Body Weight: 100#  % Ideal Body Weight: 88%  Wt Readings from Last 10 Encounters:  10/22/14 88 lb 14.4 oz (40.325 kg)  08/20/14 94 lb 7.7 oz (42.855 kg)  04/02/14 98 lb (44.453 kg)  02/19/12 98 lb (44.453 kg)  01/22/12 97 lb (43.999 kg)  08/01/11 102 lb (46.267 kg)  03/01/11 108 lb (48.988 kg)  02/26/11 106 lb (48.081 kg)  03/01/10 113 lb (51.256 kg)  02/12/10 109 lb (49.442 kg)    Usual Body Weight: 109%  % Usual Body Weight: 81%  BMI:  Body mass index is 17.36 kg/(m^2).  Estimated Nutritional Needs: Kcal: 1300-1500 Protein: 48-58 grams Fluid: 1.3-1.5 L  Skin: lt wrist cellulitis, ecchymosis  Diet Order: DIET - DYS 1  EDUCATION NEEDS: -Education not appropriate at this time   Intake/Output Summary (Last 24 hours) at 10/26/14 1019 Last data filed at 10/26/14 0949  Gross per 24 hour  Intake   1954 ml  Output  0 ml  Net   1954 ml    Last BM: PTA  Labs:   Recent Labs Lab 10/22/14 1645 10/23/14 0508 10/25/14 0432  NA 138 138 141  K 4.3 4.2 4.1  CL 101 103 109  CO2 25 22 26   BUN 18 22 19   CREATININE 0.58 0.64 0.64  CALCIUM 9.0 8.6 8.3*  GLUCOSE 105* 102* 86    CBG (last 3)  No results for input(s): GLUCAP in the last 72 hours.  Scheduled Meds: . beta carotene w/minerals  1 tablet Oral Daily  . calcium-vitamin D  1 tablet Oral Q breakfast  .  cefTRIAXone (ROCEPHIN)  IV  1 g Intravenous Q24H  . enoxaparin (LOVENOX) injection  30 mg Subcutaneous Q24H  . feeding supplement (ENSURE COMPLETE)  237 mL Oral TID BM  . metoprolol tartrate  12.5 mg Oral BID  . sodium chloride  3 mL Intravenous Q12H    Continuous Infusions: . sodium chloride 75 mL/hr at 10/26/14 0106    Past Medical History  Diagnosis Date  . Painful respiration   . Effusion of lower leg joint   . Dysuria   . Family history of malignant neoplasm of gastrointestinal tract   . Diarrhea   . Painful respiration   . Effusion of lower leg joint   . HOH (hard of hearing)   . Osteoarthritis   . Macular degeneration     Past Surgical History  Procedure Laterality Date  . Hip sx-right  1993  . Child birth    . Catract surgery/bilateral    . Appendectomy      Bobbi Yount A. Mayford KnifeWilliams, RD, LDN Pager: [phone] After hours Pager: [phone]

## 2014-10-26 NOTE — Clinical Social Work Note (Signed)
CSW spoke with [facility]- patient can be readmitted at any time- no holiday restrictions for admission tomorrow.  CSW will continue to follow.  Merlyn LotJenna Holoman, LCSWA Clinical Social Worker [phone]

## 2014-10-27 MED ORDER — AMOXICILLIN-POT CLAVULANATE 875-125 MG PO TABS: 1.0000 | ORAL_TABLET | Freq: Two times a day (BID) | ORAL | Status: DC

## 2014-10-27 NOTE — Discharge Summary (Signed)
Physician Discharge Summary  Deborah Gilbert ZHY:[id number] DOB: [birth year] DOA: 10/22/2014  PCP: Evette Georges, MD  Admit date: 10/22/2014 Discharge date: 10/27/2014  Time spent: 35 minutes  Recommendations for Outpatient Follow-up:  1. Please follow up on CBC and BMP in 1 week   Discharge Diagnoses:  Principal Problem:   Cellulitis of wrist Active Problems:   PAF (paroxysmal atrial fibrillation)   Dementia without behavioral disturbance   Protein-calorie malnutrition, severe   Anemia of chronic renal failure, stage 2 (mild)   Cellulitis and abscess of hand   UTI (urinary tract infection)   Discharge Condition: Stable  Diet recommendation: Heart healthy  Filed Weights   10/22/14 1950  Weight: 40.325 kg (88 lb 14.4 oz)    History of present illness:  Deborah Gilbert is a 80 y.o. female with a history of Osteoarthritis, Macular Degeneration, and Dementia who was sent from her SNF due to right wrist pain, redness and swelling. She was evaluated in the ED and x-rays were performed of the Left Wrist which were negative for fracture, and blood cultures were sent and she was placed on IV Ancef. She was also found to have a UTI and a Urine Culture was also sent. SHe is unable to give a history due to Dementia.  Hospital Course:  Patient is a pleasant 80 year old female with a history of advanced dementia who was transferred to the emergency room on 10/22/2014 from her facility with complaints of left wrist pain and swelling. She was diagnosed with left wrist cellulitis and started on IV Ancef. Patient showing gradual clinical improvement. Her blood cultures drawn on 10/22/2014 showing no growth 2 sets. Her urine cultures did grow Enterobacter which was resistant to Ancef. Ancef was discontinued as she was started on ceftriaxone 1 g IV every 24 hours. By 10/27/2014 wrist cellulitis nearly resolving. She was discharged on Augmentin 875 one tablet by mouth twice a day to her  facility.   Discharge Exam: Filed Vitals:   10/27/14 0558  BP: 140/83  Pulse: 90  Temp: 98.4 F (36.9 C)  Resp: 21    General: Patient is confused, disoriented, in no acute distress Cardiovascular: Regular rate and rhythm normal S1-S2 Respiratory: Normal respiratory effort lungs are clear Abdomen: Soft nontender nondistended Extremities: Significant improvement to left wrist cellulitis with near resolution to erythema  Discharge Instructions   Discharge Instructions    Call MD for:  difficulty breathing, headache or visual disturbances    Complete by:  As directed      Call MD for:  extreme fatigue    Complete by:  As directed      Call MD for:  hives    Complete by:  As directed      Call MD for:  persistant dizziness or light-headedness    Complete by:  As directed      Call MD for:  persistant nausea and vomiting    Complete by:  As directed      Call MD for:  redness, tenderness, or signs of infection (pain, swelling, redness, odor or green/yellow discharge around incision site)    Complete by:  As directed      Call MD for:  severe uncontrolled pain    Complete by:  As directed      Call MD for:  temperature >100.4    Complete by:  As directed      Diet - low sodium heart healthy    Complete by:  As directed  Increase activity slowly    Complete by:  As directed           Current Discharge Medication List    START taking these medications   Details  amoxicillin-clavulanate (AUGMENTIN) 875-125 MG per tablet Take 1 tablet by mouth 2 (two) times daily. Qty: 4 tablet, Refills: 0      CONTINUE these medications which have NOT CHANGED   Details  beta carotene w/minerals (OCUVITE) tablet Take 1 tablet by mouth daily.    Calcium Carbonate-Vitamin D (CALCIUM 600+D) 600-400 MG-UNIT per tablet Take 1 tablet by mouth daily.    Cranberry 250 MG CAPS Take 500 mg by mouth 2 (two) times daily.    magnesium hydroxide (MILK OF MAGNESIA) 400 MG/5ML suspension Take  15 mLs by mouth daily as needed for mild constipation. If no relief in 8 hours give 6oz of prune juice with milk of mag    meclizine (ANTIVERT) 12.5 MG tablet Take 12.5 mg by mouth daily.     feeding supplement, ENSURE COMPLETE, (ENSURE COMPLETE) LIQD Take 237 mLs by mouth 3 (three) times daily between meals. Qty: 90 Bottle, Refills: 4    metoprolol tartrate (LOPRESSOR) 25 MG tablet Take 1 tablet (25 mg total) by mouth 2 (two) times daily. Qty: 60 tablet, Refills: 2       Allergies  Allergen Reactions  . Famotidine     REACTION: "felt like a heart attack"  . Pepcid [Famotidine] Other (See Comments)    Called patient's nursing home and they do not have any reactions known  . Tetracycline     REACTION: Severe nausea and vomiting  . Tetracyclines & Related     Unknown   . Trimethoprim     Unknown    Follow-up Information    Follow up with [name],[name], MD In 2 weeks.   Specialty:  Family Medicine   Contact information:   [address] [place] [address] [phone]        The results of significant diagnostics from this hospitalization (including imaging, microbiology, ancillary and laboratory) are listed below for reference.    Significant Diagnostic Studies: Dg Wrist Complete Left  10/22/2014   CLINICAL DATA:  Swollen and red wrist. No reported injury. Abnormality noted today.  EXAM: LEFT WRIST - COMPLETE 3+ VIEW  COMPARISON:  None.  FINDINGS: There is no evidence of fracture or dislocation. There is no evidence of erosive arthropathy or other focal bone abnormality. No periostitis. Cartilage calcification. Mild soft tissue swelling. Osteopenia.  IMPRESSION: No acute osseous findings.  Soft tissue swelling.   Electronically Signed   By: Davonna BellingJohn  Curnes M.D.   On: 10/22/2014 18:07   Dg Chest Port 1 View  10/22/2014   CLINICAL DATA:  Possible septic joint  EXAM: PORTABLE CHEST - 1 VIEW  COMPARISON:  Radiograph 08/18/2014  FINDINGS: Normal cardiac silhouette.  Lungs are hyperinflated. This chronic bronchitic markings. No effusion, infiltrate, or pneumothorax.  IMPRESSION: Hyperinflated lungs.  No acute findings.   Electronically Signed   By: Genevive BiStewart  Edmunds M.D.   On: 10/22/2014 18:03    Microbiology: Recent Results (from the past 240 hour(s))  Blood Culture (routine x 2)     Status: None (Preliminary result)   Collection Time: 10/22/14  4:39 PM  Result Value Ref Range Status   Specimen Description BLOOD LEFT ARM  Final   Special Requests BOTTLES DRAWN AEROBIC AND ANAEROBIC 5CC  Final   Culture  Setup Time   Final  10/22/2014 23:13 Performed at [facility]    Culture   Final           BLOOD CULTURE RECEIVED NO GROWTH TO DATE CULTURE WILL BE HELD FOR 5 DAYS BEFORE ISSUING A FINAL NEGATIVE REPORT Performed at [facility]    Report Status PENDING  Incomplete  Blood Culture (routine x 2)     Status: None (Preliminary result)   Collection Time: 10/22/14  4:45 PM  Result Value Ref Range Status   Specimen Description BLOOD LEFT FOREARM  Final   Special Requests BOTTLES DRAWN AEROBIC AND ANAEROBIC 5CC  Final   Culture  Setup Time   Final    10/22/2014 23:13 Performed at [facility]    Culture   Final           BLOOD CULTURE RECEIVED NO GROWTH TO DATE CULTURE WILL BE HELD FOR 5 DAYS BEFORE ISSUING A FINAL NEGATIVE REPORT Performed at [facility]    Report Status PENDING  Incomplete  Urine culture     Status: None   Collection Time: 10/22/14  6:01 PM  Result Value Ref Range Status   Specimen Description URINE, CATHETERIZED  Final   Special Requests NONE  Final   Culture  Setup Time   Final    10/23/2014 05:45 Performed at [facility]    Colony Count   Final    >=100,000 COLONIES/ML Performed at [facility]    Culture   Final    ENTEROBACTER CLOACAE Performed at [facility]    Report Status 10/25/2014 FINAL  Final   Organism ID, Bacteria ENTEROBACTER CLOACAE  Final       Susceptibility   Enterobacter cloacae - MIC*    CEFAZOLIN >=64 RESISTANT Resistant     CEFTRIAXONE <=1 SENSITIVE Sensitive     CIPROFLOXACIN <=0.25 SENSITIVE Sensitive     GENTAMICIN <=1 SENSITIVE Sensitive     LEVOFLOXACIN 0.5 SENSITIVE Sensitive     NITROFURANTOIN 32 SENSITIVE Sensitive     TOBRAMYCIN <=1 SENSITIVE Sensitive     TRIMETH/SULFA 40 SENSITIVE Sensitive     PIP/TAZO <=4 SENSITIVE Sensitive     * ENTEROBACTER CLOACAE     Labs: Basic Metabolic Panel:  Recent Labs Lab 10/22/14 1645 10/23/14 0508 10/25/14 0432  NA 138 138 141  K 4.3 4.2 4.1  CL 101 103 109  CO2 25 22 26   GLUCOSE 105* 102* 86  BUN 18 22 19   CREATININE 0.58 0.64 0.64  CALCIUM 9.0 8.6 8.3*   Liver Function Tests:  Recent Labs Lab 10/22/14 1645  AST 11  ALT 7  ALKPHOS 73  BILITOT 0.5  PROT 6.1  ALBUMIN 2.5*   No results for input(s): LIPASE, AMYLASE in the last 168 hours. No results for input(s): AMMONIA in the last 168 hours. CBC:  Recent Labs Lab 10/22/14 1645 10/23/14 0508  WBC 6.1 6.9  NEUTROABS 4.5  --   HGB 11.5* 10.7*  HCT 36.3 33.9*  MCV 89.4 90.4  PLT 216 208   Cardiac Enzymes: No results for input(s): CKTOTAL, CKMB, CKMBINDEX, TROPONINI in the last 168 hours. BNP: BNP (last 3 results) No results for input(s): PROBNP in the last 8760 hours. CBG: No results for input(s): GLUCAP in the last 168 hours.     SignedJeralyn Bennett:  Aleshia Cartelli  [facility] 10/27/2014, 10:29 AM

## 2014-10-27 NOTE — Progress Notes (Signed)
Discharge patient to SNF per Md order [facility] No need to call report per SW. Patient is alert X 1, confused, not in any distress prior to discharge.

## 2014-10-27 NOTE — Clinical Social Work Note (Signed)
Patient will discharge to [facility] Anticipated discharge date:12/24 Family notified: facility notified- no family [facility]  CSW signing off.  Merlyn LotJenna Holoman, LCSWA Clinical Social Worker [phone]

## 2014-10-28 LAB — CULTURE, BLOOD (ROUTINE X 2)
CULTURE: NO GROWTH
Culture: NO GROWTH

## 2015-01-07 ENCOUNTER — Emergency Department (HOSPITAL_COMMUNITY): Payer: Medicare Other

## 2015-01-07 ENCOUNTER — Observation Stay (HOSPITAL_COMMUNITY)
Admission: EM | Admit: 2015-01-07 | Discharge: 2015-01-11 | Disposition: A | Discharge status: Skilled Nursing Facility | Payer: Medicare Other | Attending: Internal Medicine | Admitting: Internal Medicine

## 2015-01-07 ENCOUNTER — Encounter (HOSPITAL_COMMUNITY): Payer: Self-pay | Admitting: Emergency Medicine

## 2015-01-07 DIAGNOSIS — Z888 Allergy status to other drugs, medicaments and biological substances status: Secondary | ICD-10-CM | POA: Diagnosis not present

## 2015-01-07 DIAGNOSIS — N390 Urinary tract infection, site not specified: Secondary | ICD-10-CM | POA: Diagnosis not present

## 2015-01-07 DIAGNOSIS — N182 Chronic kidney disease, stage 2 (mild): Secondary | ICD-10-CM | POA: Diagnosis not present

## 2015-01-07 DIAGNOSIS — J439 Emphysema, unspecified: Secondary | ICD-10-CM | POA: Insufficient documentation

## 2015-01-07 DIAGNOSIS — Z881 Allergy status to other antibiotic agents status: Secondary | ICD-10-CM | POA: Insufficient documentation

## 2015-01-07 DIAGNOSIS — D631 Anemia in chronic kidney disease: Secondary | ICD-10-CM | POA: Diagnosis present

## 2015-01-07 DIAGNOSIS — R0781 Pleurodynia: Principal | ICD-10-CM | POA: Diagnosis present

## 2015-01-07 DIAGNOSIS — R079 Chest pain, unspecified: Secondary | ICD-10-CM | POA: Diagnosis present

## 2015-01-07 DIAGNOSIS — E860 Dehydration: Secondary | ICD-10-CM | POA: Diagnosis not present

## 2015-01-07 DIAGNOSIS — I5031 Acute diastolic (congestive) heart failure: Secondary | ICD-10-CM | POA: Diagnosis present

## 2015-01-07 DIAGNOSIS — F0390 Unspecified dementia without behavioral disturbance: Secondary | ICD-10-CM | POA: Diagnosis not present

## 2015-01-07 DIAGNOSIS — Z79899 Other long term (current) drug therapy: Secondary | ICD-10-CM | POA: Diagnosis not present

## 2015-01-07 DIAGNOSIS — I480 Paroxysmal atrial fibrillation: Secondary | ICD-10-CM | POA: Diagnosis present

## 2015-01-07 DIAGNOSIS — R8290 Unspecified abnormal findings in urine: Secondary | ICD-10-CM | POA: Diagnosis present

## 2015-01-07 LAB — TROPONIN I
Troponin I: <0.03 ng/mL (ref <0.031)
Troponin I: <0.03 ng/mL (ref <0.031)

## 2015-01-07 LAB — URINALYSIS, ROUTINE W REFLEX MICROSCOPIC
Bilirubin Urine: NEGATIVE
Glucose, UA: NEGATIVE mg/dL
Ketones, ur: NEGATIVE mg/dL
Nitrite: POSITIVE — AB
Protein, ur: NEGATIVE mg/dL
SPECIFIC GRAVITY, URINE: 1.014 (ref 1.005–1.030)
Urobilinogen, UA: 0.2 mg/dL (ref 0.0–1.0)
pH: 5.5 (ref 5.0–8.0)

## 2015-01-07 LAB — COMPREHENSIVE METABOLIC PANEL
ALBUMIN: 2.5 g/dL — AB (ref 3.5–5.2)
ALT: 13 U/L (ref 0–35)
AST: 19 U/L (ref 0–37)
Alkaline Phosphatase: 75 U/L (ref 39–117)
Anion gap: 7 (ref 5–15)
BUN: 19 mg/dL (ref 6–23)
CALCIUM: 7.8 mg/dL — AB (ref 8.4–10.5)
CO2: 25 mmol/L (ref 19–32)
Chloride: 109 mmol/L (ref 96–112)
Creatinine, Ser: 0.75 mg/dL (ref 0.50–1.10)
GFR, EST AFRICAN AMERICAN: 83 mL/min — AB (ref >90)
GFR, EST NON AFRICAN AMERICAN: 71 mL/min — AB (ref >90)
GLUCOSE: 112 mg/dL — AB (ref 70–99)
POTASSIUM: 3.4 mmol/L — AB (ref 3.5–5.1)
Sodium: 141 mmol/L (ref 135–145)
TOTAL PROTEIN: 5.2 g/dL — AB (ref 6.0–8.3)
Total Bilirubin: 0.6 mg/dL (ref 0.3–1.2)

## 2015-01-07 LAB — D-DIMER, QUANTITATIVE: D-Dimer, Quant: 1.07 ug/mL-FEU — ABNORMAL HIGH (ref 0.00–0.48)

## 2015-01-07 LAB — CBC WITH DIFFERENTIAL/PLATELET
BASOS ABS: 0 10*3/uL (ref 0.0–0.1)
Basophils Relative: 1 % (ref 0–1)
EOS ABS: 0.1 10*3/uL (ref 0.0–0.7)
Eosinophils Relative: 1 % (ref 0–5)
HCT: 33.9 % — ABNORMAL LOW (ref 36.0–46.0)
HEMOGLOBIN: 11.1 g/dL — AB (ref 12.0–15.0)
LYMPHS PCT: 16 % (ref 12–46)
Lymphs Abs: 1 10*3/uL (ref 0.7–4.0)
MCH: 29.6 pg (ref 26.0–34.0)
MCHC: 32.7 g/dL (ref 30.0–36.0)
MCV: 90.4 fL (ref 78.0–100.0)
MONOS PCT: 10 % (ref 3–12)
Monocytes Absolute: 0.6 10*3/uL (ref 0.1–1.0)
NEUTROS PCT: 72 % (ref 43–77)
Neutro Abs: 4.4 10*3/uL (ref 1.7–7.7)
Platelets: 191 10*3/uL (ref 150–400)
RBC: 3.75 MIL/uL — ABNORMAL LOW (ref 3.87–5.11)
RDW: 15.5 % (ref 11.5–15.5)
WBC: 6.1 10*3/uL (ref 4.0–10.5)

## 2015-01-07 LAB — URINE MICROSCOPIC-ADD ON

## 2015-01-07 LAB — BRAIN NATRIURETIC PEPTIDE: B NATRIURETIC PEPTIDE 5: 137.9 pg/mL — AB (ref 0.0–100.0)

## 2015-01-07 LAB — LIPASE, BLOOD: Lipase: 26 U/L (ref 11–59)

## 2015-01-07 LAB — GLUCOSE, CAPILLARY: GLUCOSE-CAPILLARY: 106 mg/dL — AB (ref 70–99)

## 2015-01-07 MED ORDER — ONDANSETRON HCL 4 MG PO TABS: 4.0000 mg | ORAL_TABLET | Freq: Four times a day (QID) | ORAL | Status: DC | PRN

## 2015-01-07 MED ORDER — SODIUM CHLORIDE 0.9 % IV SOLN
INTRAVENOUS | Status: DC
  Administered 2015-01-07: 11:00:00 via INTRAVENOUS
  Administered 2015-01-08: 20 mL/h via INTRAVENOUS

## 2015-01-07 MED ORDER — KETOROLAC TROMETHAMINE 15 MG/ML IJ SOLN
15.0000 mg | Freq: Once | INTRAMUSCULAR | Status: AC
  Administered 2015-01-07: 15 mg via INTRAVENOUS
  Filled 2015-01-07: qty 1

## 2015-01-07 MED ORDER — HEPARIN SODIUM (PORCINE) 5000 UNIT/ML IJ SOLN
5000.0000 [IU] | Freq: Three times a day (TID) | INTRAMUSCULAR | Status: DC
  Administered 2015-01-07 – 2015-01-11 (×11): 5000 [IU] via SUBCUTANEOUS
  Filled 2015-01-07 (×11): qty 1

## 2015-01-07 MED ORDER — ENSURE COMPLETE PO LIQD
237.0000 mL | Freq: Three times a day (TID) | ORAL | Status: DC
  Administered 2015-01-08 – 2015-01-10 (×8): 237 mL via ORAL

## 2015-01-07 MED ORDER — IOHEXOL 350 MG/ML SOLN
100.0000 mL | Freq: Once | INTRAVENOUS | Status: AC | PRN
  Administered 2015-01-07: 80 mL via INTRAVENOUS

## 2015-01-07 MED ORDER — SODIUM CHLORIDE 0.9 % IV SOLN
INTRAVENOUS | Status: DC
  Administered 2015-01-07: 14:00:00 via INTRAVENOUS

## 2015-01-07 MED ORDER — ONDANSETRON HCL 4 MG/2ML IJ SOLN: 4.0000 mg | Freq: Four times a day (QID) | INTRAMUSCULAR | Status: DC | PRN

## 2015-01-07 MED ORDER — MAGNESIUM HYDROXIDE 400 MG/5ML PO SUSP: 30.0000 mL | Freq: Every day | ORAL | Status: DC | PRN

## 2015-01-07 MED ORDER — ALUM & MAG HYDROXIDE-SIMETH 200-200-20 MG/5ML PO SUSP: 30.0000 mL | Freq: Four times a day (QID) | ORAL | Status: DC | PRN

## 2015-01-07 MED ORDER — ACETAMINOPHEN 325 MG PO TABS
650.0000 mg | ORAL_TABLET | Freq: Four times a day (QID) | ORAL | Status: DC | PRN
  Administered 2015-01-08 – 2015-01-09 (×2): 650 mg via ORAL
  Filled 2015-01-07 (×3): qty 2

## 2015-01-07 MED ORDER — TRAMADOL HCL 50 MG PO TABS
50.0000 mg | ORAL_TABLET | Freq: Two times a day (BID) | ORAL | Status: DC
  Administered 2015-01-07 – 2015-01-10 (×7): 50 mg via ORAL
  Filled 2015-01-07 (×8): qty 1

## 2015-01-07 MED ORDER — ACETAMINOPHEN 650 MG RE SUPP: 650.0000 mg | Freq: Four times a day (QID) | RECTAL | Status: DC | PRN

## 2015-01-07 MED ORDER — DOCUSATE SODIUM 100 MG PO CAPS
100.0000 mg | ORAL_CAPSULE | Freq: Two times a day (BID) | ORAL | Status: DC
  Administered 2015-01-07 – 2015-01-08 (×2): 100 mg via ORAL
  Filled 2015-01-07 (×2): qty 1

## 2015-01-07 MED ORDER — MAGNESIUM HYDROXIDE 400 MG/5ML PO SUSP: 15.0000 mL | Freq: Every day | ORAL | Status: DC | PRN

## 2015-01-07 MED ORDER — MECLIZINE HCL 12.5 MG PO TABS
12.5000 mg | ORAL_TABLET | Freq: Every day | ORAL | Status: DC
  Administered 2015-01-08 – 2015-01-10 (×3): 12.5 mg via ORAL
  Filled 2015-01-07 (×4): qty 1

## 2015-01-07 MED ORDER — CALCIUM CARBONATE-VITAMIN D 600-400 MG-UNIT PO TABS
1.0000 | ORAL_TABLET | Freq: Every day | ORAL | Status: DC
  Filled 2015-01-07: qty 1

## 2015-01-07 NOTE — ED Notes (Signed)
Received pt from [facility] with c/o woke up with center to left sided chest pain. No other symptoms noted. Pt given 324 mg of ASA by EMS and 2 nitro tabs. Pt BP was 77/40 after the nitro given by EMS.

## 2015-01-07 NOTE — ED Notes (Signed)
Patient transported to X-ray 

## 2015-01-07 NOTE — H&P (Signed)
[facility] History and Physical                                                                                    Deborah Gilbert, is a 81 y.o. female  MRN: [id number]   DOB - May 12, 1920  Admit Date - 01/07/2015  Outpatient Primary MD for the patient is [name],[name] [name], MD  With History of -  Past Medical History  Diagnosis Date  . Painful respiration   . Effusion of lower leg joint   . Dysuria   . Family history of malignant neoplasm of gastrointestinal tract   . Diarrhea   . Painful respiration   . Effusion of lower leg joint   . HOH (hard of hearing)   . Osteoarthritis   . Macular degeneration       Past Surgical History  Procedure Laterality Date  . Hip sx-right  1993  . Child birth    . Catract surgery/bilateral    . Appendectomy      in for   Chief Complaint  Patient presents with  . Chest Pain     HPI Deborah Gilbert  is a 81 y.o. female, dementia, stage II kidney disease with associated anemia, recurrent pleuritic chest pain, very hard of hearing, and paroxysmal atrial fibrillation. Patient was sent to the ER from her nursing facility today with complaints of chest pain. Patient is an extremely poor historian due to her underlying dementia. ER physician documented patient reported pleuritic type chest discomfort. Chest x-ray in the ER was concerning for possible mild CHF. Patient denying current shortness of breath or respiratory symptoms. She was stable on room air with oxygen saturations 96-97%. Initial troponin was less than 0.03. Because of the chest discomfort a d-dimer was checked and this was marginally elevated at 1.7. CT angiogram of the chest was ordered; this did not demonstrate a pulmonary embolism but questioned degree of pulmonary hypertension and also question inflammatory changes immediately inferior to the stomach and abutting the tail of the pancreas. EKG revealed sinus rhythm without any definitive ischemic ST changes.  During my  conversation with the patient in the emergency department she reports that for the past 3 days she has been very weak and was telling the staff at the nursing facility so that she could not walk. She states she thinks they got mad at her and decided to get her out of the bed. She states that the nurse that is normally very nice to her picked her up and accidentally squeezed her too hard. It was after that her chest began to hurt around the rib cage. Again history is very difficult to obtain from this patient given her underlying dementia but her chest wall pain was reproducible on palpation. She felt like after the symptoms began that she also had trouble breathing because it hurt to breathe.  Review of Systems   In addition to the HPI above, **please note review of systems unreliable overall difficult to obtain given patient's underlying mentation and hearing loss   *A full 10 point Review of Systems was done, except as stated above, all other Review of Systems were negative.  Social History  History  Substance Use Topics  . Smoking status: Unknown If Ever Smoked  . Smokeless tobacco: Not on file  . Alcohol Use: No    Family History No past family history on file and given patient's underlying dementia unable to accurately obtain  Prior to Admission medications   Medication Sig Start Date End Date Taking? Authorizing Provider  beta carotene w/minerals (OCUVITE) tablet Take 1 tablet by mouth daily.   Yes Historical Provider, MD  Calcium Carbonate-Vitamin D (CALCIUM 600+D) 600-400 MG-UNIT per tablet Take 1 tablet by mouth daily.   Yes Historical Provider, MD  Cranberry 250 MG CAPS Take 500 mg by mouth 2 (two) times daily.   Yes Historical Provider, MD  feeding supplement, ENSURE COMPLETE, (ENSURE COMPLETE) LIQD Take 237 mLs by mouth 3 (three) times daily between meals. 08/20/14  Yes [name], MD  magnesium hydroxide (MILK OF MAGNESIA) 400 MG/5ML suspension Take 15 mLs by mouth daily  as needed for mild constipation. If no relief in 8 hours give 6oz of prune juice with milk of mag   Yes Historical Provider, MD  meclizine (ANTIVERT) 12.5 MG tablet Take 12.5 mg by mouth daily.    Yes Historical Provider, MD  amoxicillin-clavulanate (AUGMENTIN) 875-125 MG per tablet Take 1 tablet by mouth 2 (two) times daily. 10/27/14   [name], MD  metoprolol tartrate (LOPRESSOR) 25 MG tablet Take 1 tablet (25 mg total) by mouth 2 (two) times daily. Patient not taking: Reported on 10/22/2014 08/20/14   [name], MD    Allergies  Allergen Reactions  . Famotidine     REACTION: "felt like a heart attack"  . Pepcid [Famotidine] Other (See Comments)    Called patient's nursing home and they do not have any reactions known  . Tetracycline     REACTION: Severe nausea and vomiting  . Tetracyclines & Related     Unknown   . Trimethoprim     Unknown     Physical Exam  Vitals  Blood pressure 126/61, pulse 69, resp. rate 25, height  (1.575 m), weight 90 lb (40.824 kg), SpO2 99 %.   General:  In no acute distress, appears stated age  Psych:  Alert and very talkative, circular reasoning noted throughout most of the conversation. Patient oriented to name only. Clearly demonstrates decreased short-term memory patterns when tested.  Neuro:   No focal neurological deficits, CN II through XII intact, Strength 5/5 all 4 extremities, Sensation intact all 4 extremities.  ENT:  Ears appear Normal, Conjunctivae non-reddened and with some white thick drainage, dry oral mucosa without erythema or exudates.  Neck:  Supple, No lymphadenopathy appreciated  Respiratory:  Symmetrical chest wall movement, Good air movement bilaterally, CTAB. Room Air; bilateral chest wall tender around lateral rib cage when palpated  Cardiac:  RRR, No Murmurs, no LE edema noted, no JVD, No carotid bruits, peripheral pulses palpable at 2+  Abdomen:  Positive bowel sounds, Soft, Non tender, Non  distended,  No masses appreciated, no obvious hepatosplenomegaly  Skin:  No Cyanosis, Normal Skin Turgor, No Skin Rash, Edward bruising bilateral lower extremities  Extremities: Symmetrical without obvious trauma or injury,  no effusions.  Data Review  CBC  Recent Labs Lab 01/07/15 1018  WBC 6.1  HGB 11.1*  HCT 33.9*  PLT 191  MCV 90.4  MCH 29.6  MCHC 32.7  RDW 15.5  LYMPHSABS 1.0  MONOABS 0.6  EOSABS 0.1  BASOSABS 0.0    Chemistries   Recent Labs  Lab 01/07/15 1018  NA 141  K 3.4*  CL 109  CO2 25  GLUCOSE 112*  BUN 19  CREATININE 0.75  CALCIUM 7.8*  AST 19  ALT 13  ALKPHOS 75  BILITOT 0.6    estimated creatinine clearance is 28.9 mL/min (by C-G formula based on Cr of 0.75).  No results for input(s): TSH, T4TOTAL, T3FREE, THYROIDAB in the last 72 hours.  Invalid input(s): FREET3  Coagulation profile No results for input(s): INR, PROTIME in the last 168 hours.   Recent Labs  01/07/15 1018  DDIMER 1.07*    Cardiac Enzymes  Recent Labs Lab 01/07/15 1018  TROPONINI <0.03    Invalid input(s): POCBNP  Urinalysis    Component Value Date/Time   COLORURINE YELLOW 01/07/2015 1350   APPEARANCEUR CLOUDY* 01/07/2015 1350   LABSPEC 1.014 01/07/2015 1350   PHURINE 5.5 01/07/2015 1350   GLUCOSEU NEGATIVE 01/07/2015 1350   HGBUR MODERATE* 01/07/2015 1350   HGBUR trace-lysed 02/12/2010 1110   BILIRUBINUR NEGATIVE 01/07/2015 1350   BILIRUBINUR 1+ 01/22/2012 1400   KETONESUR NEGATIVE 01/07/2015 1350   PROTEINUR NEGATIVE 01/07/2015 1350   PROTEINUR 1+ 01/22/2012 1400   UROBILINOGEN 0.2 01/07/2015 1350   UROBILINOGEN 0.2 01/22/2012 1400   NITRITE POSITIVE* 01/07/2015 1350   NITRITE n 01/22/2012 1400   LEUKOCYTESUR TRACE* 01/07/2015 1350    Imaging results:   Dg Chest 2 View  01/07/2015   CLINICAL DATA:  Chest pain. Onset of symptoms this morning. Initial encounter.  EXAM: CHEST  2 VIEW  COMPARISON:  10/22/2014.  FINDINGS: Emphysema.  Cardiopericardial silhouette upper limits of normal for projection. Tortuous thoracic aorta with arch atherosclerotic calcification. Bilateral pleural apical thickening/ scarring. Extreme RIGHT lung apex excluded from view. Diffuse interstitial prominence is present consistent with interstitial pulmonary edema. Kerley B lines are present at the periphery. Scattered areas of subsegmental atelectasis. No gross lobar consolidation. No effusions.  IMPRESSION: Mild CHF superimposed on emphysema.   Electronically Signed   By: Andreas Newport M.D.   On: 01/07/2015 12:18   Ct Angio Chest Pe W/cm &/or Wo Cm  01/07/2015   CLINICAL DATA:  Central and left-sided chest pain. Elevated D-dimer.  EXAM: CT ANGIOGRAPHY CHEST WITH CONTRAST  TECHNIQUE: Multidetector CT imaging of the chest was performed using the standard protocol during bolus administration of intravenous contrast. Multiplanar CT image reconstructions and MIPs were obtained to evaluate the vascular anatomy.  CONTRAST:  80mL OMNIPAQUE IOHEXOL 350 MG/ML SOLN  COMPARISON:  Chest x-ray earlier today.  FINDINGS: The pulmonary arteries are well opacified. There is no evidence of pulmonary embolism. Dilatation of central pulmonary arteries present, likely reflecting some degree of pulmonary hypertension. The main pulmonary artery measures up to 3.5 cm in diameter.  The thoracic aorta is also well opacified. No evidence of thoracic aortic aneurysmal disease. Proximal great vessels are normally patent.  Lungs show scattered areas of parenchymal scarring bilaterally as well as atelectasis at both lung bases. There is no evidence of consolidation, edema, nodule or pneumothorax. No pleural or pericardial fluid is seen. The heart size is normal.  Coronary atherosclerosis present with mild amount of calcified plaque at the level of the distal left main coronary artery extending into the proximal LAD.  The visualized upper abdomen shows potential inflammatory changes just inferior  to the stomach and abutting the tail of the pancreas. Correlation suggested with the possibility of pancreatitis or gastritis/peptic disease. The bony thorax shows osteopenia and mild spondylosis of the thoracic spine.  Review of the  MIP images confirms the above findings.  IMPRESSION: 1. No evidence of pulmonary embolism. 2. Probable degree of underlying pulmonary hypertension with dilatation of the central pulmonary arteries. 3. Normal thoracic aorta. 4. Coronary atherosclerosis with mild amount of calcified plaque at the level of the distal left main and proximal LAD. 5. Potential inflammatory changes immediately inferior to the stomach and abutting the tail of the pancreas. Correlation suggested with any potential abdominal etiology for this finding including pancreatitis or gastric pathology.   Electronically Signed   By: Irish LackGlenn  Yamagata M.D.   On: 01/07/2015 14:07     EKG: Sinus rhythm without definitive ischemic ST changes   Assessment & Plan  Principal Problem:   Rib pain/chest pain -Admit to telemetry as precaution-observational status -History and physical exam more consistent with musculoskeletal rib pain; likely related to unintentional trauma with moving the patient several days prior -1 time dose of Toradol given chronic kidney disease and began every 12 hour on schedule Ultram -Physical therapy evaluation -Tylenol available for pain as well  Active Problems:   Dehydration, mild -Despite chest x-ray reporting mild CHF clinically patient appears dry; no evidence of edema on CT of the chest -No abdominal pain noted on exam but given the abnormal findings on the T that could be related to pancreatitis as precaution we'll check lipase  -Since not currently reporting abdominal pain, nausea and no emesis reported in nursing transfer notes will allow regular diet -Begin gentle IV fluid hydration -Repeat lab in a.m.    CKD (chronic kidney disease) stage 2, GFR 60-89 ml/min -Current  renal function at baseline: 19/0.75    Anemia of chronic renal failure, stage 2 (mild) -Hemoglobin stable around baseline: 11.1    Abnormal urinalysis -Patient has a history of recurrent UTIs typically with either Enterobacter or Escherichia coli -Suspect her complaints of not being able to walk for 3 days with weakness likely related to occult UTI -Since no fever or leukocytosis will defer initiation of empiric antibiotics and instead wait upon urine culture -Patient unreliable historian so unable to determine if having dysuria prior to admission    PAF history -Currently maintaining sinus rhythm -Given advanced age and likely fall risk not an appropriate candidate for anticoagulation should atrial fibrillation recur -Hold beta blocker initially since blood pressure somewhat soft; he able to resume by a.m. after IV fluid hydration    Dementia without behavioral disturbance -Not on medications prior to admission -Physical therapy evaluation especially with recent issues regarding mobility reported by patient -Responsible party listed on information from [facility]: Bethanne GingerJuanita Smith ([phone]/mobile * [phone]/home)    DVT Prophylaxis: Subcutaneous heparin  Family Communication:   No family at bedside  Code Status: Full code   Condition:  Stable  Time spent in minutes : 60   Trask Vosler L. ANP on 01/07/2015 at 2:42 PM  Between 7am to 7pm - Pager - [phone]  After 7pm go to [email or web address] - password [email or web address]  And look for the night coverage person covering me after hours  [facility]

## 2015-01-07 NOTE — ED Notes (Signed)
hospitalist NP at bedside 

## 2015-01-07 NOTE — ED Provider Notes (Signed)
CSN: [id number]     Arrival date & time 01/07/15  4098 History   First MD Initiated Contact with Patient 01/07/15 1001     No chief complaint on file.    (Consider location/radiation/quality/duration/timing/severity/associated sxs/prior Treatment) HPI Comments: Patient here complaining of chest pain 3 days located on the left side of her chest characterizes constantly intermittent episodes of becoming worse. Some associated dyspnea but no diaphoresis or nausea vomiting. Denies any fever or cough. No leg pain or swelling. Denies any rashes to her chest. Has used nitroglycerin without relief. Patient describes a pleuritic component to it.  The history is provided by the patient.    Past Medical History  Diagnosis Date  . Painful respiration   . Effusion of lower leg joint   . Dysuria   . Family history of malignant neoplasm of gastrointestinal tract   . Diarrhea   . Painful respiration   . Effusion of lower leg joint   . HOH (hard of hearing)   . Osteoarthritis   . Macular degeneration    Past Surgical History  Procedure Laterality Date  . Hip sx-right  1993  . Child birth    . Catract surgery/bilateral    . Appendectomy     No family history on file. History  Substance Use Topics  . Smoking status: Unknown If Ever Smoked  . Smokeless tobacco: Not on file  . Alcohol Use: No   OB History    No data available     Review of Systems  All other systems reviewed and are negative.     Allergies  Famotidine; Pepcid; Tetracycline; Tetracyclines & related; and Trimethoprim  Home Medications   Prior to Admission medications   Medication Sig Start Date End Date Taking? Authorizing Provider  amoxicillin-clavulanate (AUGMENTIN) 875-125 MG per tablet Take 1 tablet by mouth 2 (two) times daily. 10/27/14   [name], MD  beta carotene w/minerals (OCUVITE) tablet Take 1 tablet by mouth daily.    Historical Provider, MD  Calcium Carbonate-Vitamin D (CALCIUM 600+D) 600-400  MG-UNIT per tablet Take 1 tablet by mouth daily.    Historical Provider, MD  Cranberry 250 MG CAPS Take 500 mg by mouth 2 (two) times daily.    Historical Provider, MD  feeding supplement, ENSURE COMPLETE, (ENSURE COMPLETE) LIQD Take 237 mLs by mouth 3 (three) times daily between meals. Patient not taking: Reported on 10/22/2014 08/20/14   [name], MD  magnesium hydroxide (MILK OF MAGNESIA) 400 MG/5ML suspension Take 15 mLs by mouth daily as needed for mild constipation. If no relief in 8 hours give 6oz of prune juice with milk of mag    Historical Provider, MD  meclizine (ANTIVERT) 12.5 MG tablet Take 12.5 mg by mouth daily.     Historical Provider, MD  metoprolol tartrate (LOPRESSOR) 25 MG tablet Take 1 tablet (25 mg total) by mouth 2 (two) times daily. Patient not taking: Reported on 10/22/2014 08/20/14   [name], MD   There were no vitals taken for this visit. Physical Exam  Constitutional: She is oriented to person, place, and time. She appears well-developed and well-nourished.  Non-toxic appearance. No distress.  HENT:  Head: Normocephalic and atraumatic.  Eyes: Conjunctivae, EOM and lids are normal. Pupils are equal, round, and reactive to light.  Neck: Normal range of motion. Neck supple. No tracheal deviation present. No thyroid mass present.  Cardiovascular: Normal rate, regular rhythm and normal heart sounds.  Exam reveals no gallop.   No murmur  heard. Pulmonary/Chest: Effort normal and breath sounds normal. No stridor. No respiratory distress. She has no decreased breath sounds. She has no wheezes. She has no rhonchi. She has no rales.  Abdominal: Soft. Normal appearance and bowel sounds are normal. She exhibits no distension. There is no tenderness. There is no rebound and no CVA tenderness.  Musculoskeletal: Normal range of motion. She exhibits no edema or tenderness.  Neurological: She is alert and oriented to person, place, and time. She has normal strength.  No cranial nerve deficit or sensory deficit. GCS eye subscore is 4. GCS verbal subscore is 5. GCS motor subscore is 6.  Skin: Skin is warm and dry. No abrasion and no rash noted.  Psychiatric: She has a normal mood and affect. Her speech is normal and behavior is normal.  Nursing note and vitals reviewed.   ED Course  Procedures (including critical care time) Labs Review Labs Reviewed  TROPONIN I  CBC WITH DIFFERENTIAL/PLATELET  COMPREHENSIVE METABOLIC PANEL  D-DIMER, QUANTITATIVE    Imaging Review No results found.   EKG Interpretation   Date/Time:  Saturday January 07 2015 10:10:12 EST Ventricular Rate:  75 PR Interval:  144 QRS Duration: 80 QT Interval:  426 QTC Calculation: 476 R Axis:   81 Text Interpretation:  Sinus rhythm Borderline right axis deviation  Confirmed by Nefertiti Mohamad  MD, Chenee Munns ([id number]) on 01/07/2015 10:19:58 AM      MDM   Final diagnoses:  Chest pain    Patient's x-ray and laboratory studies noted. Will be admitted for treatment of CHF as well as evaluation of her chest pain    Toy BakerAnthony T Taffy Delconte, MD 01/07/15 1327

## 2015-01-08 DIAGNOSIS — R8290 Unspecified abnormal findings in urine: Secondary | ICD-10-CM

## 2015-01-08 DIAGNOSIS — I480 Paroxysmal atrial fibrillation: Secondary | ICD-10-CM | POA: Diagnosis not present

## 2015-01-08 DIAGNOSIS — E860 Dehydration: Secondary | ICD-10-CM

## 2015-01-08 DIAGNOSIS — N390 Urinary tract infection, site not specified: Secondary | ICD-10-CM | POA: Diagnosis not present

## 2015-01-08 DIAGNOSIS — R0781 Pleurodynia: Secondary | ICD-10-CM

## 2015-01-08 DIAGNOSIS — F0390 Unspecified dementia without behavioral disturbance: Secondary | ICD-10-CM | POA: Diagnosis not present

## 2015-01-08 LAB — CBC
HCT: 34.2 % — ABNORMAL LOW (ref 36.0–46.0)
Hemoglobin: 11 g/dL — ABNORMAL LOW (ref 12.0–15.0)
MCH: 28.9 pg (ref 26.0–34.0)
MCHC: 32.2 g/dL (ref 30.0–36.0)
MCV: 90 fL (ref 78.0–100.0)
Platelets: 167 10*3/uL (ref 150–400)
RBC: 3.8 MIL/uL — ABNORMAL LOW (ref 3.87–5.11)
RDW: 15.3 % (ref 11.5–15.5)
WBC: 6 10*3/uL (ref 4.0–10.5)

## 2015-01-08 LAB — COMPREHENSIVE METABOLIC PANEL
ALBUMIN: 2.6 g/dL — AB (ref 3.5–5.2)
ALT: 14 U/L (ref 0–35)
AST: 18 U/L (ref 0–37)
Alkaline Phosphatase: 82 U/L (ref 39–117)
Anion gap: 7 (ref 5–15)
BUN: 21 mg/dL (ref 6–23)
CHLORIDE: 110 mmol/L (ref 96–112)
CO2: 24 mmol/L (ref 19–32)
Calcium: 8.5 mg/dL (ref 8.4–10.5)
Creatinine, Ser: 0.79 mg/dL (ref 0.50–1.10)
GFR calc Af Amer: 81 mL/min — ABNORMAL LOW (ref >90)
GFR calc non Af Amer: 70 mL/min — ABNORMAL LOW (ref >90)
GLUCOSE: 128 mg/dL — AB (ref 70–99)
Potassium: 4.5 mmol/L (ref 3.5–5.1)
Sodium: 141 mmol/L (ref 135–145)
TOTAL PROTEIN: 5.4 g/dL — AB (ref 6.0–8.3)
Total Bilirubin: 0.7 mg/dL (ref 0.3–1.2)

## 2015-01-08 LAB — GLUCOSE, CAPILLARY: Glucose-Capillary: 94 mg/dL (ref 70–99)

## 2015-01-08 LAB — TROPONIN I: Troponin I: <0.03 ng/mL (ref <0.031)

## 2015-01-08 MED ORDER — POLYETHYLENE GLYCOL 3350 17 G PO PACK
17.0000 g | PACK | Freq: Every day | ORAL | Status: DC
  Administered 2015-01-08 – 2015-01-10 (×3): 17 g via ORAL
  Filled 2015-01-08 (×3): qty 1

## 2015-01-08 MED ORDER — SENNOSIDES-DOCUSATE SODIUM 8.6-50 MG PO TABS
2.0000 | ORAL_TABLET | Freq: Two times a day (BID) | ORAL | Status: DC
  Administered 2015-01-08 – 2015-01-10 (×4): 2 via ORAL
  Filled 2015-01-08 (×6): qty 2

## 2015-01-08 MED ORDER — CALCIUM CARBONATE-VITAMIN D 500-200 MG-UNIT PO TABS
1.0000 | ORAL_TABLET | Freq: Every day | ORAL | Status: DC
  Administered 2015-01-08 – 2015-01-10 (×3): 1 via ORAL
  Filled 2015-01-08 (×3): qty 1

## 2015-01-08 MED ORDER — METOPROLOL TARTRATE 12.5 MG HALF TABLET
12.5000 mg | ORAL_TABLET | Freq: Two times a day (BID) | ORAL | Status: DC
  Administered 2015-01-08 – 2015-01-09 (×3): 12.5 mg via ORAL
  Filled 2015-01-08 (×3): qty 1

## 2015-01-08 NOTE — Progress Notes (Signed)
PROGRESS NOTE  Deborah Gilbert YQM:[id number] DOB: October 31, 1920 DOA: 01/07/2015 PCP: Evette Georges, MD  HPI/Recap of past 24 hours: Advanced demented/cachectic elderly female laying in bed, not cooperating with physical exam. Agitated when chest wall palpated.  Assessment/Plan: Principal Problem:   Rib pain Active Problems:   PAF history   Dementia without behavioral disturbance   CKD (chronic kidney disease) stage 2, GFR 60-89 ml/min   Anemia of chronic renal failure, stage 2 (mild)   Dehydration, mild   Abnormal urinalysis  Rib pain/chest pain -telemetry as precaution-observational status -History and physical exam more consistent with musculoskeletal rib pain; likely related to unintentional trauma with moving the patient several days prior -Physical therapy evaluation -troponin negative/CTA no PE.  Active Problems:  Dehydration, mild -Despite chest x-ray reporting mild CHF? clinically patient appears dry; no evidence of edema on CT of the chest -gentle IV fluid hydration -swallow eval, nutrition supplement   CKD (chronic kidney disease) stage 2, GFR 60-89 ml/min -Current renal function at baseline: 19/0.75   Anemia of chronic renal failure, stage 2 (mild) -Hemoglobin stable around baseline: 11.1   Abnormal urinalysis -Patient has a history of recurrent UTIs typically with either Enterobacter or Escherichia coli -Suspect her complaints of not being able to walk for 3 days with weakness likely related to occult UTI -Since no fever or leukocytosis will defer initiation of empiric antibiotics and instead wait upon urine culture -Patient unreliable historian so unable to determine if having dysuria prior to admission -provide stool softener to prevent constipation   PAF history -Currently maintaining sinus rhythm -Given advanced age and likely fall risk not an appropriate candidate for anticoagulation should atrial fibrillation recur -beta blocker initially on  hold due to soft blood pressure, resumed on 3/6 with bp stablization.    Dementia without behavioral disturbance -advanced/pysical therapy/swallow eval/palliative care consult.     DVT Prophylaxis: Subcutaneous heparin  Family Communication: No family at bedside  Code Status: Full code   Disposition Plan: palliative care consulted   Consultants:  Palliative care  Procedures:  none  Antibiotics:  none   Objective: BP 147/69 mmHg  Pulse 86  Temp(Src) 98 F (36.7 C) (Axillary)  Resp 20  Ht  (1.575 m)  Wt 40.824 kg (90 lb)  BMI 16.46 kg/m2  SpO2 95%  Intake/Output Summary (Last 24 hours) at 01/08/15 1310 Last data filed at 01/08/15 0900  Gross per 24 hour  Intake      0 ml  Output      0 ml  Net      0 ml   Filed Weights   01/07/15 1037  Weight: 40.824 kg (90 lb)    Exam:  General: Advanced demented/cachectic elderly female laying in bed, not cooperating with physical exam. Agitated when chest wall palpated. Appear calm when not disturbed.   Eyes: PERRL, normal lids, irises  ENT: Very hard of hearing  Cardiovascular: Regular rate and rhythm, no murmur, rub or gallop. No lower extremity edema.  Respiratory: Clear to auscultation bilaterally, no wheezes, rales or rhonchi. Normal respiratory effort.  Abdomen: soft, ntnd  Musculoskeletal: grossly normal tone bilateral upper and lower extremities  Psychiatric: flat affect, indistinct speech   Neurologic: grossly non-focal.seems move all extremities   Skin: diffuse ecchymosis, more on lower extremities.    Data Reviewed: Basic Metabolic Panel:  Recent Labs Lab 01/07/15 1018 01/08/15 0142  NA 141 141  K 3.4* 4.5  CL 109 110  CO2 25 24  GLUCOSE 112* 128*  BUN 19 21  CREATININE 0.75 0.79  CALCIUM 7.8* 8.5   Liver Function Tests:  Recent Labs Lab 01/07/15 1018 01/08/15 0142  AST 19 18  ALT 13 14  ALKPHOS 75 82  BILITOT 0.6 0.7  PROT 5.2* 5.4*  ALBUMIN 2.5* 2.6*     Recent Labs Lab 01/07/15 1018  LIPASE 26   No results for input(s): AMMONIA in the last 168 hours. CBC:  Recent Labs Lab 01/07/15 1018 01/08/15 0142  WBC 6.1 6.0  NEUTROABS 4.4  --   HGB 11.1* 11.0*  HCT 33.9* 34.2*  MCV 90.4 90.0  PLT 191 167   Cardiac Enzymes:    Recent Labs Lab 01/07/15 1018 01/07/15 2104 01/08/15 0142 01/08/15 0740  TROPONINI <0.03 <0.03 <0.03 <0.03   BNP (last 3 results)  Recent Labs  01/07/15 1307  BNP 137.9*    ProBNP (last 3 results) No results for input(s): PROBNP in the last 8760 hours.  CBG:  Recent Labs Lab 01/07/15 2143 01/08/15 0622  GLUCAP 106* 94    No results found for this or any previous visit (from the past 240 hour(s)).   Studies: Dg Chest 2 View  01/07/2015   CLINICAL DATA:  Chest pain. Onset of symptoms this morning. Initial encounter.  EXAM: CHEST  2 VIEW  COMPARISON:  10/22/2014.  FINDINGS: Emphysema. Cardiopericardial silhouette upper limits of normal for projection. Tortuous thoracic aorta with arch atherosclerotic calcification. Bilateral pleural apical thickening/ scarring. Extreme RIGHT lung apex excluded from view. Diffuse interstitial prominence is present consistent with interstitial pulmonary edema. Kerley B lines are present at the periphery. Scattered areas of subsegmental atelectasis. No gross lobar consolidation. No effusions.  IMPRESSION: Mild CHF superimposed on emphysema.   Electronically Signed   By: Andreas NewportGeoffrey  Lamke M.D.   On: 01/07/2015 12:18   Ct Angio Chest Pe W/cm &/or Wo Cm  01/07/2015   CLINICAL DATA:  Central and left-sided chest pain. Elevated D-dimer.  EXAM: CT ANGIOGRAPHY CHEST WITH CONTRAST  TECHNIQUE: Multidetector CT imaging of the chest was performed using the standard protocol during bolus administration of intravenous contrast. Multiplanar CT image reconstructions and MIPs were obtained to evaluate the vascular anatomy.  CONTRAST:  80mL OMNIPAQUE IOHEXOL 350 MG/ML SOLN  COMPARISON:   Chest x-ray earlier today.  FINDINGS: The pulmonary arteries are well opacified. There is no evidence of pulmonary embolism. Dilatation of central pulmonary arteries present, likely reflecting some degree of pulmonary hypertension. The main pulmonary artery measures up to 3.5 cm in diameter.  The thoracic aorta is also well opacified. No evidence of thoracic aortic aneurysmal disease. Proximal great vessels are normally patent.  Lungs show scattered areas of parenchymal scarring bilaterally as well as atelectasis at both lung bases. There is no evidence of consolidation, edema, nodule or pneumothorax. No pleural or pericardial fluid is seen. The heart size is normal.  Coronary atherosclerosis present with mild amount of calcified plaque at the level of the distal left main coronary artery extending into the proximal LAD.  The visualized upper abdomen shows potential inflammatory changes just inferior to the stomach and abutting the tail of the pancreas. Correlation suggested with the possibility of pancreatitis or gastritis/peptic disease. The bony thorax shows osteopenia and mild spondylosis of the thoracic spine.  Review of the MIP images confirms the above findings.  IMPRESSION: 1. No evidence of pulmonary embolism. 2. Probable degree of underlying pulmonary hypertension with dilatation of the central pulmonary arteries. 3. Normal thoracic aorta. 4. Coronary atherosclerosis with mild amount of  calcified plaque at the level of the distal left main and proximal LAD. 5. Potential inflammatory changes immediately inferior to the stomach and abutting the tail of the pancreas. Correlation suggested with any potential abdominal etiology for this finding including pancreatitis or gastric pathology.   Electronically Signed   By: Irish Lack M.D.   On: 01/07/2015 14:07    Scheduled Meds: . calcium-vitamin D  1 tablet Oral Q breakfast  . docusate sodium  100 mg Oral BID  . feeding supplement (ENSURE COMPLETE)  237  mL Oral TID BM  . heparin  5,000 Units Subcutaneous 3 times per day  . meclizine  12.5 mg Oral Daily  . traMADol  50 mg Oral Q12H    Continuous Infusions: . sodium chloride 20 mL/hr (01/08/15 0606)       Hussein Macdougal  [facility] Pager [phone]. If 7PM-7AM, please contact night-coverage at [email or web address], password [facility] 01/08/2015, 1:10 PM

## 2015-01-08 NOTE — Progress Notes (Signed)
Patient was restless and attempting to get out of bed by herself. Managed to pull her IV out of her right forearm. Patient moved to room 5N3 where she is closer to the nursing station and can be monitored via video camera. Bed alarm is set.

## 2015-01-08 NOTE — Progress Notes (Signed)
Patient has had no complaints of chest pain. Patient resting. Nursing will continue to monitor.

## 2015-01-08 NOTE — Progress Notes (Signed)
PT Cancellation Note  Patient Details Name: Deborah GivensWilma L Gilbert MRN: [id number] DOB: [birth year]   Cancelled Treatment:    Reason Eval/Treat Not Completed: Other (comment)   Adamantly refusing despite my best efforts at validating and comforting pt;   Will follow up later today as time allows;  Otherwise, will follow up for PT tomorrow;   Thank you,  Van ClinesHolly Conan Mcmanaway, PT  Acute Rehabilitation Services Pager [phone] Office [phone]     Van ClinesGarrigan, [name] 01/08/2015, 12:12 PM

## 2015-01-08 NOTE — Progress Notes (Signed)
Utilization review completed.  P.J. Cesareo Vickrey,RN,BSN Case Manager 

## 2015-01-09 DIAGNOSIS — R0781 Pleurodynia: Secondary | ICD-10-CM | POA: Diagnosis not present

## 2015-01-09 DIAGNOSIS — R627 Adult failure to thrive: Secondary | ICD-10-CM

## 2015-01-09 DIAGNOSIS — N390 Urinary tract infection, site not specified: Secondary | ICD-10-CM

## 2015-01-09 DIAGNOSIS — F0390 Unspecified dementia without behavioral disturbance: Secondary | ICD-10-CM | POA: Diagnosis not present

## 2015-01-09 DIAGNOSIS — I480 Paroxysmal atrial fibrillation: Secondary | ICD-10-CM | POA: Diagnosis not present

## 2015-01-09 MED ORDER — LEVOFLOXACIN 500 MG PO TABS
500.0000 mg | ORAL_TABLET | Freq: Every day | ORAL | Status: DC
  Administered 2015-01-09 – 2015-01-10 (×2): 500 mg via ORAL
  Filled 2015-01-09 (×2): qty 1

## 2015-01-09 MED ORDER — CEFTRIAXONE SODIUM IN DEXTROSE 20 MG/ML IV SOLN
1.0000 g | INTRAVENOUS | Status: DC
  Filled 2015-01-09: qty 50

## 2015-01-09 MED ORDER — METOPROLOL TARTRATE 25 MG PO TABS
25.0000 mg | ORAL_TABLET | Freq: Two times a day (BID) | ORAL | Status: DC
  Administered 2015-01-10 (×2): 25 mg via ORAL
  Filled 2015-01-09 (×3): qty 1

## 2015-01-09 NOTE — Evaluation (Addendum)
Physical Therapy Evaluation Patient Details Name: Deborah Gilbert MRN: [id number] DOB: August 06, 1920 Today's Date: 01/09/2015   History of Present Illness  81 y.o. female admitted to [facility] on 01/07/15 with reports of chest pain from SNF.  CXR was negative for fx, but mildly positive for CHF.  Medical workup pending. Pt dx with UTI, dehydration, and non-cardiac source of chest pain as pt is in NSR.  Pt with significant PMHx of dementia, HOH, OA, macular degeneration, R hip surgery (1993), and bil cataract surgery.    Clinical Impression  Pt's ability to mobilize and participate with PT is significantly impaired by her cognitive status.  With persons with advanced dementia such as this pt it is hard to say if she could return to ALF to her normal environment and mobilize just as well, but her current presentation in the acute hospital setting indicates that she would be more appropriate for SNF level of care at this time.  PT will continue to follow acutely.      Follow Up Recommendations SNF vs ALF depending on the ALF factility's ability to take her    Equipment Recommendations  None recommended by PT    Recommendations for Other Services   NA    Precautions / Restrictions Precautions Precautions: Fall      Mobility  Bed Mobility Overal bed mobility: Needs Assistance;+2 for physical assistance Bed Mobility: Sit to Supine;Supine to Sit     Supine to sit: +2 for physical assistance;Mod assist;Max assist Sit to supine: +2 for physical assistance;Max assist;Mod assist   General bed mobility comments: Two person mod to max assist during transitions due to pt's resistance to movement.  She will initiate coming to sitting, but not finishe the progression, if assistance is provided she gets fearful and resistant and pushes posteriorly into trunk extension.    Transfers                 General transfer comment: NT due to pt's resistance to movement.   Ambulation/Gait              General Gait Details: unable at this time due to pt's level of cognition      Balance Overall balance assessment: Needs assistance Sitting-balance support: Feet supported;Bilateral upper extremity supported Sitting balance-Leahy Scale: Poor Sitting balance - Comments: posterior push Postural control: Posterior lean                                   Pertinent Vitals/Pain Pain Assessment: Faces Faces Pain Scale: Hurts little more Pain Location: back per pt report while moving Pain Descriptors / Indicators: Grimacing;Guarding Pain Intervention(s): Limited activity within patient's tolerance;Monitored during session;Repositioned    Home Living Family/patient expects to be discharged to:: Skilled nursing facility                      Prior Function           Comments: pt unable to report history        Extremity/Trunk Assessment   Upper Extremity Assessment: Overall WFL for tasks assessed (pt very strong in arms, pushing posteriorly)           Lower Extremity Assessment: Generalized weakness (limited assessment due to cognitive status)      Cervical / Trunk Assessment: Kyphotic  Communication   Communication: HOH  Cognition Arousal/Alertness: Awake/alert Behavior During Therapy: Restless;Agitated;Anxious Overall Cognitive Status: History of cognitive  impairments - at baseline                      General Comments General comments (skin integrity, edema, etc.): Spent a significant amount of time trying to convince pt that she needed to move to EOB and stand (offered toilet, task, adjustment of undergarments, etc).  Pt very resistant to all activity despite therapist's best efforts and if pushed to do something she wanted resistance and increased aggitation kicks in.            Assessment/Plan    PT Assessment Patient needs continued PT services  PT Diagnosis Difficulty walking;Abnormality of gait;Generalized weakness;Acute  pain   PT Problem List Decreased strength;Decreased activity tolerance;Decreased balance;Decreased mobility;Decreased cognition;Decreased knowledge of use of DME;Pain  PT Treatment Interventions DME instruction;Gait training;Functional mobility training;Therapeutic activities;Therapeutic exercise;Balance training;Neuromuscular re-education;Cognitive remediation;Patient/family education;Modalities   PT Goals (Current goals can be found in the Care Plan section) Acute Rehab PT Goals Patient Stated Goal: unable to state PT Goal Formulation: Patient unable to participate in goal setting Time For Goal Achievement: 01/23/15 Potential to Achieve Goals: Fair    Frequency Min 2X/week   Barriers to discharge Decreased caregiver support in current presentation, pt is more appropriate for SNF level of care, however, she is out of her familiar environment with new faces and this increases her aggitation, restlessness, and decreases her cognitive abilities.  She may do ok going back to ALF and being in her normal environment.  This is very hard to predict in the dementia population.        End of Session   Activity Tolerance: Treatment limited secondary to agitation Patient left: in bed;with call bell/phone within reach;with bed alarm set      Functional Assessment Tool Used: assist level Functional Limitation: Mobility: Walking and moving around Mobility: Walking and Moving Around Current Status ([id number]): At least 40 percent but less than 60 percent impaired, limited or restricted Mobility: Walking and Moving Around Goal Status [phone]([id number]): At least 1 percent but less than 20 percent impaired, limited or restricted    Time: 1431-1448 PT Time Calculation (min) (ACUTE ONLY): 17 min   Charges:   PT Evaluation $Initial PT Evaluation Tier I: 1 Procedure     PT G Codes:   PT G-Codes **NOT FOR INPATIENT CLASS** Functional Assessment Tool Used: assist level Functional Limitation: Mobility: Walking and  moving around Mobility: Walking and Moving Around Current Status ([id number]([id number]): At least 40 percent but less than 60 percent impaired, limited or restricted Mobility: Walking and Moving Around Goal Status [phone]([id number]): At least 1 percent but less than 20 percent impaired, limited or restricted    Lisandro Meggett B. Jaycelynn Knickerbocker, PT, DPT [phone]   01/09/2015, 4:17 PM

## 2015-01-09 NOTE — Progress Notes (Signed)
Pt was angry at staff when given a bath and repositioned. Complained of right rib pain and blamed the staff for her pain. Has attempted to get out of the bed several times but usually falls asleep when placed back in the bed. Refuses to wear gown because it causes her "breast" pain.

## 2015-01-09 NOTE — Evaluation (Signed)
Clinical/Bedside Swallow Evaluation Patient Details  Name: Deborah GivensWilma L Gilbert MRN: [id number] Date of Birth: [birth year]  Today's Date: 01/09/2015 Time: SLP Start Time (ACUTE ONLY): [id number] SLP Stop Time (ACUTE ONLY): 1006 SLP Time Calculation (min) (ACUTE ONLY): 24 min  Past Medical History:  Past Medical History  Diagnosis Date  . Painful respiration   . Effusion of lower leg joint   . Dysuria   . Family history of malignant neoplasm of gastrointestinal tract   . Diarrhea   . Painful respiration   . Effusion of lower leg joint   . HOH (hard of hearing)   . Osteoarthritis   . Macular degeneration    Past Surgical History:  Past Surgical History  Procedure Laterality Date  . Hip sx-right  1993  . Child birth    . Catract surgery/bilateral    . Appendectomy     HPI:  81 y.o. female, dementia, stage II kidney disease, recurrent pleuritic chest pain, very hard of hearing, and paroxysmal atrial fibrillation admitted with complaints of chest pain.  Chest x-ray mild CHF superimposed on emphysema. BSE 08/19/14 revealed no s/s of aspiration with thin liquids however consistent multiple swallows with soft solids followed by throat clearing suggestive of pharyngeal residuals due to weakness and/or esophageal component.. Dys 1 and thin recommeded.   Assessment / Plan / Recommendation Clinical Impression  Pt required moderate cues to sustain attention to po trials. Straw sips thin resulted in immediate throat clears likely due to increased volume and velocity. Swallow audible. Mildly delayed oral prep, mastication and delayed transit. No evidence pna on CXR. Recommend Dys 3, thin liquids, no straws, full supervision, pills whole in applesauce. ST will follow up.    Aspiration Risk  Moderate    Diet Recommendation Dysphagia 3 (Mechanical Soft);Thin liquid   Liquid Administration via: Cup;No straw Medication Administration: Whole meds with puree Supervision: Full supervision/cueing for  compensatory strategies;Staff to assist with self feeding Compensations: Slow rate;Small sips/bites;Check for pocketing Postural Changes and/or Swallow Maneuvers: Seated upright 90 degrees    Other  Recommendations Oral Care Recommendations: Oral care BID   Follow Up Recommendations  None    Frequency and Duration min 2x/week  2 weeks   Pertinent Vitals/Pain none         Swallow Study          Oral/Motor/Sensory Function Overall Oral Motor/Sensory Function:  (no gross deficits)   Ice Chips Ice chips: Not tested   Thin Liquid Thin Liquid: Impaired Presentation: Cup;Straw Oral Phase Impairments: Reduced labial seal Oral Phase Functional Implications: Right anterior spillage Pharyngeal  Phase Impairments: Throat Clearing - Immediate    Nectar Thick Nectar Thick Liquid: Not tested   Honey Thick Honey Thick Liquid: Not tested   Puree Puree: Within functional limits   Solid   GO Functional Assessment Tool Used:  (skilled clinical judgement) Functional Limitations: Swallowing Swallow Current Status ([id number]): At least 20 percent but less than 40 percent impaired, limited or restricted Swallow Goal Status [phone]([phone]): At least 20 percent but less than 40 percent impaired, limited or restricted Swallow Discharge Status [phone]): At least 20 percent but less than 40 percent impaired, limited or restricted  Solid: Impaired Oral Phase Impairments: Reduced lingual movement/coordination       Royce MacadamiaLitaker, Grantley Savage Willis 01/09/2015,10:19 AM   Breck CoonsLisa Willis Lonell FaceLitaker M.Ed [employer] Pager [phone]

## 2015-01-09 NOTE — Progress Notes (Signed)
Unable to give medications and Ensure.  Patient refused, tried multiple times but patient became combative.

## 2015-01-09 NOTE — Progress Notes (Signed)
PROGRESS NOTE  Deborah GivensWilma L Richeson [id number] DOB: 14-Oct-1920 DOA: 01/07/2015 PCP: Evette GeorgesDD,[name], MD  HPI/Recap of past 24 hours: More alert and interactive today, know her own birth date. Not oriented to place and time.  Advanced demented/cachectic elderly female laying in bed, not cooperating with physical exam.   Assessment/Plan: Principal Problem:   Rib pain Active Problems:   PAF history   Dementia without behavioral disturbance   CKD (chronic kidney disease) stage 2, GFR 60-89 ml/min   Anemia of chronic renal failure, stage 2 (mild)   Dehydration, mild   Abnormal urinalysis  UTI -h/o recurrent UTIs typically with either Enterobacter or Escherichia coli -Suspect her complaints of not being able to walk for 3 days with weakness likely related to occult UTI - no fever or leukocytosis, urine culture +g-, will start levaquin orally, RN report patient kept picking on IV. But takes oral meds. abx to be adjusted per final urine culture  Rib pain/chest pain -telemetry snr. - consistent with musculoskeletal rib pain; likely related to unintentional trauma with moving the patient several days prior -Physical therapy evaluation -troponin negative/CTA no PE.   Dehydration, mild -Despite chest x-ray reporting mild CHF? clinically patient appears dry; no evidence of edema on CT of the chest -gentle IV fluid hydration -swallow eval, nutrition supplement -RN reported good oral intake today, patient kept picking IV, will d/c ivf, encourage oral intake.   CKD (chronic kidney disease) stage 2, GFR 60-89 ml/min -Current renal function at baseline: 19/0.75   Anemia of chronic renal failure, stage 2 (mild) -Hemoglobin stable around baseline: 11.1     PAF history -Currently maintaining sinus rhythm -Given advanced age and likely fall risk not an appropriate candidate for anticoagulation  -beta blocker initially on hold due to soft blood pressure, resumed on 3/6 with bp  stablization.    Dementia without behavioral disturbance -advanced/pysical therapy/swallow eval/palliative care consult.     DVT Prophylaxis: Subcutaneous heparin  Family Communication: No family at bedside  Code Status: Full code   Disposition Plan: palliative care consulted   Consultants:  Palliative care  Procedures:  none  Antibiotics:  Oral levaquin   Objective: BP 159/81 mmHg  Pulse 88  Temp(Src) 98.2 F (36.8 C) (Oral)  Resp 16  Ht 5\' 2"  (1.575 m)  Wt 40.824 kg (90 lb)  BMI 16.46 kg/m2  SpO2 92%  Intake/Output Summary (Last 24 hours) at 01/09/15 1348 Last data filed at 01/09/15 0805  Gross per 24 hour  Intake    720 ml  Output      0 ml  Net    720 ml   Filed Weights   01/07/15 1037  Weight: 40.824 kg (90 lb)    Exam:  General: Advanced demented/cachectic elderly female laying in bed, not cooperating with physical exam. More interactive today  Eyes: PERRL, normal lids, irises  ENT: Very hard of hearing  Cardiovascular: Regular rate and rhythm, no murmur, rub or gallop. No lower extremity edema.  Respiratory: Clear to auscultation bilaterally, no wheezes, rales or rhonchi. Normal respiratory effort.  Abdomen: soft, ntnd  Musculoskeletal: grossly normal tone bilateral upper and lower extremities  Psychiatric: flat affect, indistinct speech   Neurologic: grossly non-focal.seems move all extremities   Skin: diffuse ecchymosis, more on lower extremities.    Data Reviewed: Basic Metabolic Panel:  Recent Labs Lab 01/07/15 1018 01/08/15 0142  NA 141 141  K 3.4* 4.5  CL 109 110  CO2 25 24  GLUCOSE 112* 128*  BUN  19 21  CREATININE 0.75 0.79  CALCIUM 7.8* 8.5   Liver Function Tests:  Recent Labs Lab 01/07/15 1018 01/08/15 0142  AST 19 18  ALT 13 14  ALKPHOS 75 82  BILITOT 0.6 0.7  PROT 5.2* 5.4*  ALBUMIN 2.5* 2.6*    Recent Labs Lab 01/07/15 1018  LIPASE 26   No results for input(s): AMMONIA in the last  168 hours. CBC:  Recent Labs Lab 01/07/15 1018 01/08/15 0142  WBC 6.1 6.0  NEUTROABS 4.4  --   HGB 11.1* 11.0*  HCT 33.9* 34.2*  MCV 90.4 90.0  PLT 191 167   Cardiac Enzymes:    Recent Labs Lab 01/07/15 1018 01/07/15 2104 01/08/15 0142 01/08/15 0740  TROPONINI <0.03 <0.03 <0.03 <0.03   BNP (last 3 results)  Recent Labs  01/07/15 1307  BNP 137.9*    ProBNP (last 3 results) No results for input(s): PROBNP in the last 8760 hours.  CBG:  Recent Labs Lab 01/07/15 2143 01/08/15 0622  GLUCAP 106* 94    Recent Results (from the past 240 hour(s))  Urine culture     Status: None (Preliminary result)   Collection Time: 01/07/15  1:50 PM  Result Value Ref Range Status   Specimen Description URINE, RANDOM  Final   Special Requests NONE  Final   Colony Count   Final    >=100,000 COLONIES/ML Performed at [facility]    Culture   Final    GRAM NEGATIVE RODS Performed at [facility]    Report Status PENDING  Incomplete     Studies: No results found.  Scheduled Meds: . calcium-vitamin D  1 tablet Oral Q breakfast  . feeding supplement (ENSURE COMPLETE)  237 mL Oral TID BM  . heparin  5,000 Units Subcutaneous 3 times per day  . levofloxacin  500 mg Oral Daily  . meclizine  12.5 mg Oral Daily  . metoprolol tartrate  12.5 mg Oral BID  . polyethylene glycol  17 g Oral Daily  . senna-docusate  2 tablet Oral BID  . traMADol  50 mg Oral Q12H    Continuous Infusions:       Deborah Gilbert  [facility] Pager [phone]. If 7PM-7AM, please contact night-coverage at [email or web address], password [facility] 01/09/2015, 1:48 PM

## 2015-01-09 NOTE — Clinical Social Work Note (Signed)
[facility] and will return once medically discharged.  [facility] [phone] who confirms that patient may return once medically stable.  CSW was instructed to fax the updated FL2 [facility] at [phone] once available.  ALF does not provide transportation.  CSW to arrange.  Vickii PennaGina Shelah Heatley, LCSWA [phone]  Psychiatric & Orthopedics (5N 1-16) Clinical Social Worker

## 2015-01-09 NOTE — Progress Notes (Signed)
UR completed 

## 2015-01-09 NOTE — Progress Notes (Addendum)
INITIAL NUTRITION ASSESSMENT  Pt meets criteria for SEVERE MALNUTRITION in the context of chronic ilness as evidenced by severe fat and muscle mass loss.  DOCUMENTATION CODES Per approved criteria  -Severe malnutrition in the context of chronic illness -Underweight   INTERVENTION: Continue Ensure Complete po TID, each supplement provides 350 kcal and 13 grams of protein.  Encourage adequate PO intake.  NUTRITION DIAGNOSIS: Malnutrition related to chronic illness as evidenced by severe fat and muscle mass loss.   Goal: Pt to meet >/= 90% of their estimated nutrition needs   Monitor:  PO intake, weight trends, labs, I/O's  Reason for Assessment: Low BMI  81 y.o. female  Admitting Dx: Rib pain  ASSESSMENT: Pt with dementia, stage II kidney disease with associated anemia, recurrent pleuritic chest pain, very hard of hearing, and paroxysmal atrial fibrillation. Patient was sent to the ER from her nursing facility today with complaints of chest pain.  Pt confused during time of visit. No family at bedside. Meal completion has been 25%. Pt currently has Ensure ordered TID and has been consuming them. RD to continue with current orders. Pt was encouraged to eat her food at meals and to drink her supplements.  Nutrition Focused Physical Exam:  Subcutaneous Fat:  Orbital Region: N/A Upper Arm Region: Severe depletion Thoracic and Lumbar Region: Severe depletion  Muscle:  Temple Region: N/A Clavicle Bone Region: Severe depletion Clavicle and Acromion Bone Region: Severe depletion Scapular Bone Region: N/A Dorsal Hand: Severe depletion Patellar Region: Severe depletion Anterior Thigh Region: Severe depletion Posterior Calf Region: Severe depletion  Edema: +1 RLE   Labs and medications reviewed.  Height: Ht Readings from Last 1 Encounters:  01/07/15 5\' 2"  (1.575 m)    Weight: Wt Readings from Last 1 Encounters:  01/07/15 90 lb (40.824 kg)    Ideal Body Weight: 110  lbs  % Ideal Body Weight: 82%  Wt Readings from Last 10 Encounters:  01/07/15 90 lb (40.824 kg)  10/22/14 88 lb 14.4 oz (40.325 kg)  08/20/14 94 lb 7.7 oz (42.855 kg)  04/02/14 98 lb (44.453 kg)  02/19/12 98 lb (44.453 kg)  01/22/12 97 lb (43.999 kg)  08/01/11 102 lb (46.267 kg)  03/01/11 108 lb (48.988 kg)  02/26/11 106 lb (48.081 kg)  03/01/10 113 lb (51.256 kg)    Usual Body Weight: 95 lbs  % Usual Body Weight: 95%  BMI:  Body mass index is 16.46 kg/(m^2). Underweight  Estimated Nutritional Needs: Kcal: 1400-1600 Protein: 60-70 grams Fluid: >/= 1.5 L/day  Skin: Incision on left leg, +1 RLE edema  Diet Order: DIET DYS 3  EDUCATION NEEDS: -Education not appropriate at this time   Intake/Output Summary (Last 24 hours) at 01/09/15 1044 Last data filed at 01/09/15 0805  Gross per 24 hour  Intake    960 ml  Output      0 ml  Net    960 ml    Last BM: 3/5  Labs:   Recent Labs Lab 01/07/15 1018 01/08/15 0142  NA 141 141  K 3.4* 4.5  CL 109 110  CO2 25 24  BUN 19 21  CREATININE 0.75 0.79  CALCIUM 7.8* 8.5  GLUCOSE 112* 128*    CBG (last 3)   Recent Labs  01/07/15 2143 01/08/15 0622  GLUCAP 106* 94    Scheduled Meds: . calcium-vitamin D  1 tablet Oral Q breakfast  . feeding supplement (ENSURE COMPLETE)  237 mL Oral TID BM  . heparin  5,000 Units  Subcutaneous 3 times per day  . meclizine  12.5 mg Oral Daily  . metoprolol tartrate  12.5 mg Oral BID  . polyethylene glycol  17 g Oral Daily  . senna-docusate  2 tablet Oral BID  . traMADol  50 mg Oral Q12H    Continuous Infusions: . sodium chloride 20 mL/hr (01/08/15 0606)    Past Medical History  Diagnosis Date  . Painful respiration   . Effusion of lower leg joint   . Dysuria   . Family history of malignant neoplasm of gastrointestinal tract   . Diarrhea   . Painful respiration   . Effusion of lower leg joint   . HOH (hard of hearing)   . Osteoarthritis   . Macular degeneration      Past Surgical History  Procedure Laterality Date  . Hip sx-right  1993  . Child birth    . Catract surgery/bilateral    . Appendectomy      Marijean Niemann, MS, RD, LDN Pager # [phone] After hours/ weekend pager # [phone]

## 2015-01-10 DIAGNOSIS — E860 Dehydration: Secondary | ICD-10-CM | POA: Diagnosis not present

## 2015-01-10 DIAGNOSIS — I480 Paroxysmal atrial fibrillation: Secondary | ICD-10-CM | POA: Diagnosis not present

## 2015-01-10 DIAGNOSIS — F0390 Unspecified dementia without behavioral disturbance: Secondary | ICD-10-CM | POA: Diagnosis not present

## 2015-01-10 DIAGNOSIS — E43 Unspecified severe protein-calorie malnutrition: Secondary | ICD-10-CM

## 2015-01-10 DIAGNOSIS — R0781 Pleurodynia: Secondary | ICD-10-CM | POA: Diagnosis not present

## 2015-01-10 LAB — URINE CULTURE: Colony Count: >=100000

## 2015-01-10 LAB — CBC
HCT: 33.7 % — ABNORMAL LOW (ref 36.0–46.0)
Hemoglobin: 11.1 g/dL — ABNORMAL LOW (ref 12.0–15.0)
MCH: 28.9 pg (ref 26.0–34.0)
MCHC: 32.9 g/dL (ref 30.0–36.0)
MCV: 87.8 fL (ref 78.0–100.0)
Platelets: 206 K/uL (ref 150–400)
RBC: 3.84 MIL/uL — ABNORMAL LOW (ref 3.87–5.11)
RDW: 15.4 % (ref 11.5–15.5)
WBC: 6.1 K/uL (ref 4.0–10.5)

## 2015-01-10 LAB — BASIC METABOLIC PANEL
ANION GAP: 7 (ref 5–15)
BUN: 19 mg/dL (ref 6–23)
CALCIUM: 8 mg/dL — AB (ref 8.4–10.5)
CO2: 24 mmol/L (ref 19–32)
Chloride: 108 mmol/L (ref 96–112)
Creatinine, Ser: 0.69 mg/dL (ref 0.50–1.10)
GFR calc Af Amer: 85 mL/min — ABNORMAL LOW (ref >90)
GFR calc non Af Amer: 73 mL/min — ABNORMAL LOW (ref >90)
Glucose, Bld: 103 mg/dL — ABNORMAL HIGH (ref 70–99)
Potassium: 4.8 mmol/L (ref 3.5–5.1)
Sodium: 139 mmol/L (ref 135–145)

## 2015-01-10 MED ORDER — LEVOFLOXACIN 500 MG PO TABS: 500.0000 mg | ORAL_TABLET | ORAL | Status: DC

## 2015-01-10 NOTE — Progress Notes (Signed)
Speech Language Pathology Treatment: Dysphagia  Patient Details Name: Deborah Gilbert MRN: [id number] DOB: 03/28/1920 Today's Date: 01/10/2015 Time: [id number] SLP Time Calculation (min) (ACUTE ONLY): 8 min  Assessment / Plan / Recommendation Clinical Impression  Pt consumed a few cup sips of water prior to declining all further PO intake. SLP provided Mod cues for sustained attention to self-feeding task, however with no overt signs of aspiration observed. Will continue current diet with SLP f/u to assess tolerance given limited observations today.   HPI HPI: 81 y.o. female, dementia, stage II kidney disease, recurrent pleuritic chest pain, very hard of hearing, and paroxysmal atrial fibrillation admitted with complaints of chest pain.  Chest x-ray mild CHF superimposed on emphysema. BSE 08/19/14 revealed no s/s of aspiration with thin liquids however consistent multiple swallows with soft solids followed by throat clearing suggestive of pharyngeal residuals due to weakness and/or esophageal component.. Dys 1 and thin recommeded.   Pertinent Vitals Pain Assessment: Faces Faces Pain Scale: No hurt  SLP Plan  Continue with current plan of care    Recommendations Diet recommendations: Dysphagia 3 (mechanical soft);Thin liquid Liquids provided via: Cup;No straw Medication Administration: Whole meds with puree Supervision: Full supervision/cueing for compensatory strategies;Staff to assist with self feeding Compensations: Slow rate;Small sips/bites;Check for pocketing Postural Changes and/or Swallow Maneuvers: Seated upright 90 degrees       Oral Care Recommendations: Oral care BID Follow up Recommendations: None Plan: Continue with current plan of care    Deborah Gilbert, M.A. CCC-SLP [phone]  Deborah Hamaiewonsky, Rodolphe Edmonston 01/10/2015, 12:10 PM

## 2015-01-10 NOTE — Progress Notes (Signed)
UR completed 

## 2015-01-10 NOTE — Progress Notes (Signed)
PROGRESS NOTE  Deborah GivensWilma L Gilbert ZOX:[id number] DOB: [birth year] DOA: 01/07/2015 PCP: Evette GeorgesDD,[name], MD  HPI/Recap of past 24 hours:  Advanced demented/cachectic elderly female laying in bed, not cooperating with physical exam. Intermittent agitation. know her own birth date. Not oriented to place and time.     Assessment/Plan: Principal Problem:   Rib pain Active Problems:   PAF history   Dementia without behavioral disturbance   CKD (chronic kidney disease) stage 2, GFR 60-89 ml/min   Anemia of chronic renal failure, stage 2 (mild)   Dehydration, mild   Abnormal urinalysis  UTI -h/o recurrent UTIs typically with either Enterobacter or Escherichia coli -Suspect her complaints of not being able to walk for 3 days with weakness likely related to occult UTI - no fever or leukocytosis, urine culture +g-, will start levaquin orally, RN report patient kept picking on IV. But takes oral meds. abx to be adjusted per final urine culture -urine culture still pending as of 3/8 2:30pm  Rib pain/chest pain -telemetry snr. - consistent with musculoskeletal rib pain; likely related to unintentional trauma with moving the patient several days prior -Physical therapy evaluation -troponin negative/CTA no PE.   Dehydration, mild -Despite chest x-ray reporting mild CHF? clinically patient appears dry; no evidence of edema on CT of the chest -gentle IV fluid hydration -swallow eval, nutrition supplement,  -RN reported good oral intake today, patient kept picking IV, will d/c ivf, encourage oral intake.  Severe malnutrition: nutrition consult, Continue Ensure Complete po TID   CKD (chronic kidney disease) stage 2, GFR 60-89 ml/min -Current renal function at baseline: 19/0.75   Anemia of chronic renal failure, stage 2 (mild) -Hemoglobin stable around baseline: 11.1     PAF history -Currently maintaining sinus rhythm -Given advanced age and likely fall risk not an appropriate  candidate for anticoagulation  -beta blocker initially on hold due to soft blood pressure, resumed on 3/6 with bp stablization.    Dementia without behavioral disturbance -advanced/pysical therapy/swallow eval/palliative care consult paged. Need to discuss code status, end of life issues.     DVT Prophylaxis: Subcutaneous heparin  Family Communication: No family at bedside  Code Status: Full code   Disposition Plan: SNF   Consultants:  Palliative care  Procedures:  none  Antibiotics:  Oral levaquin   Objective: BP 145/93 mmHg  Pulse 102  Temp(Src) 98.3 F (36.8 C) (Axillary)  Resp 20  Ht 5\' 2"  (1.575 m)  Wt 40.824 kg (90 lb)  BMI 16.46 kg/m2  SpO2 93%  Intake/Output Summary (Last 24 hours) at 01/10/15 1420 Last data filed at 01/09/15 1700  Gross per 24 hour  Intake    120 ml  Output      0 ml  Net    120 ml   Filed Weights   01/07/15 1037  Weight: 40.824 kg (90 lb)    Exam:  General: Advanced demented/cachectic elderly female laying in bed, not cooperating with physical exam. More interactive today  Eyes: PERRL, normal lids, irises  ENT: Very hard of hearing  Cardiovascular: Regular rate and rhythm, no murmur, rub or gallop. No lower extremity edema.  Respiratory: Clear to auscultation bilaterally, no wheezes, rales or rhonchi. Normal respiratory effort.  Abdomen: soft, ntnd  Musculoskeletal: grossly normal tone bilateral upper and lower extremities  Psychiatric: flat affect, indistinct speech   Neurologic: grossly non-focal.seems move all extremities   Skin: diffuse ecchymosis, more on lower extremities.    Data Reviewed: Basic Metabolic Panel:  Recent Labs Lab  01/07/15 1018 01/08/15 0142 01/10/15 0541  NA 141 141 139  K 3.4* 4.5 4.8  CL 109 110 108  CO2 GLUCOSE 112* 128* 103*  BUN CREATININE 0.75 0.79 0.69  CALCIUM 7.8* 8.5 8.0*   Liver Function Tests:  Recent Labs Lab 01/07/15 1018  01/08/15 0142  AST 19 18  ALT 13 14  ALKPHOS 75 82  BILITOT 0.6 0.7  PROT 5.2* 5.4*  ALBUMIN 2.5* 2.6*    Recent Labs Lab 01/07/15 1018  LIPASE 26   No results for input(s): AMMONIA in the last 168 hours. CBC:  Recent Labs Lab 01/07/15 1018 01/08/15 0142 01/10/15 0541  WBC 6.1 6.0 6.1  NEUTROABS 4.4  --   --   HGB 11.1* 11.0* 11.1*  HCT 33.9* 34.2* 33.7*  MCV 90.4 90.0 87.8  PLT 191 167 206   Cardiac Enzymes:    Recent Labs Lab 01/07/15 1018 01/07/15 2104 01/08/15 0142 01/08/15 0740  TROPONINI <0.03 <0.03 <0.03 <0.03   BNP (last 3 results)  Recent Labs  01/07/15 1307  BNP 137.9*    ProBNP (last 3 results) No results for input(s): PROBNP in the last 8760 hours.  CBG:  Recent Labs Lab 01/07/15 2143 01/08/15 0622  GLUCAP 106* 94    Recent Results (from the past 240 hour(s))  Urine culture     Status: None (Preliminary result)   Collection Time: 01/07/15  1:50 PM  Result Value Ref Range Status   Specimen Description URINE, RANDOM  Final   Special Requests NONE  Final   Colony Count   Final    >=100,000 COLONIES/ML Performed at [facility]    Culture   Final    GRAM NEGATIVE RODS Performed at [facility]    Report Status PENDING  Incomplete     Studies: No results found.  Scheduled Meds: . calcium-vitamin D  1 tablet Oral Q breakfast  . feeding supplement (ENSURE COMPLETE)  237 mL Oral TID BM  . heparin  5,000 Units Subcutaneous 3 times per day  . levofloxacin  500 mg Oral Daily  . meclizine  12.5 mg Oral Daily  . metoprolol tartrate  25 mg Oral BID  . polyethylene glycol  17 g Oral Daily  . senna-docusate  2 tablet Oral BID  . traMADol  50 mg Oral Q12H    Continuous Infusions:       Deborah Gilbert  [facility] Pager [phone]. If 7PM-7AM, please contact night-coverage at [email or web address], password [facility] 01/10/2015, 2:20 PM

## 2015-01-10 NOTE — Progress Notes (Signed)
Pharmacy Note Re: Levaquin  Pt on Levaquin for suspected UTI.  Scr 0.69, Est CrCl ~ 28 ml/min.  Currently on Levaquin 500 mg q 24 hrs.  Will adjust dose to q 48 hrs based on renal function.  Please call if any questions.  Thanks!  Tad MooreJessica Makenzi Bannister, Pharm D, BCPS  Clinical Pharmacist Phone [phone] Pager [phone]  01/10/2015 2:30 PM

## 2015-01-11 DIAGNOSIS — N3000 Acute cystitis without hematuria: Secondary | ICD-10-CM

## 2015-01-11 DIAGNOSIS — N182 Chronic kidney disease, stage 2 (mild): Secondary | ICD-10-CM

## 2015-01-11 LAB — CBC
HEMATOCRIT: 37 % (ref 36.0–46.0)
Hemoglobin: 11.8 g/dL — ABNORMAL LOW (ref 12.0–15.0)
MCH: 29 pg (ref 26.0–34.0)
MCHC: 31.9 g/dL (ref 30.0–36.0)
MCV: 90.9 fL (ref 78.0–100.0)
Platelets: 198 10*3/uL (ref 150–400)
RBC: 4.07 MIL/uL (ref 3.87–5.11)
RDW: 15.3 % (ref 11.5–15.5)
WBC: 5.8 10*3/uL (ref 4.0–10.5)

## 2015-01-11 LAB — BASIC METABOLIC PANEL
Anion gap: 7 (ref 5–15)
BUN: 25 mg/dL — ABNORMAL HIGH (ref 6–23)
CALCIUM: 9 mg/dL (ref 8.4–10.5)
CO2: 28 mmol/L (ref 19–32)
Chloride: 107 mmol/L (ref 96–112)
Creatinine, Ser: 0.88 mg/dL (ref 0.50–1.10)
GFR calc Af Amer: 64 mL/min — ABNORMAL LOW (ref >90)
GFR calc non Af Amer: 55 mL/min — ABNORMAL LOW (ref >90)
Glucose, Bld: 99 mg/dL (ref 70–99)
Potassium: 4.5 mmol/L (ref 3.5–5.1)
Sodium: 142 mmol/L (ref 135–145)

## 2015-01-11 MED ORDER — OCUVITE PO TABS: 1.0000 | ORAL_TABLET | Freq: Every day | ORAL | Status: DC

## 2015-01-11 MED ORDER — LEVOFLOXACIN 500 MG PO TABS: 500.0000 mg | ORAL_TABLET | ORAL | Status: DC

## 2015-01-11 NOTE — Progress Notes (Signed)
I am sorry that we were unable to get to Ms. Earl GalaOsborne in a more timely manner prior to discharge as we were experiencing high patient volumes. I would recommend outpatient palliative services and we would be happy to try and see her on future admissions.   Yong ChannelAlicia Merritt Kibby, NP Palliative Medicine Team Pager # [phone] (M-F 8a-5p) Team Phone # [phone] (Nights/Weekends)

## 2015-01-11 NOTE — Discharge Planning (Signed)
Patient will discharge today per MD order. [facility] RN to call report prior to transportation to [phone] Transportation: [facility]  CSW sent discharge summary to SNF for review.  Packet is complete.  RN and patient aware of discharge plans.   DC summary, FL2 and PT documentation sent to ALF for their review.  All have been approved for patient to return to ALF.  CSW contacted friend listed on the chart, [name] to inform of patient's return.  Vickii PennaGina Devanta Daniel, LCSWA [phone]  Psychiatric & Orthopedics (5N 1-16) Clinical Social Worker

## 2015-01-11 NOTE — Clinical Social Work Psychosocial (Signed)
Clinical Social Work Department BRIEF PSYCHOSOCIAL ASSESSMENT 01/11/2015  Patient:  Deborah Gilbert,Deborah Gilbert     Account Number:  [id number]     Admit date:  01/07/2015  Clinical Social Worker:  Deborah DriversINGLE,Deborah Gilbert, LCSWA  Date/Time:  01/10/2015 01:02 PM  Referred by:  Physician  Date Referred:  01/10/2015 Referred for  Psychosocial assessment  ALF Placement   Other Referral:   From [facility]   Interview type:  Other - See comment Other interview type:   patient and friend Deborah Gilbert    PSYCHOSOCIAL DATA Living Status:  FACILITY Admitted from facility:   Level of care:  Assisted Living Primary support name:  Deborah Gilbert Primary support relationship to patient:  FRIEND Degree of support available:   fair to non-existent    CURRENT CONCERNS Current Concerns  Post-Acute Placement   Other Concerns:   no family support    SOCIAL WORK ASSESSMENT / PLAN CSW assessed patient at bedside. Patient has hx dx of dementia and is currently refusing medications from bedside RN.  [facility] and can return once medically discharged.  CSW contacted Deborah Gilbert, friend, listed on chart to assist with assessment.  [facility]  Deborah LodgeJuanita is the patient's former Pastor's wife who still continues to care for the patient.  Pastor's wife reports that patient has no living family and has always been alone (friends, no famiily) for the 20 years that Deborah Gilbert has known the patient.  CSW informed Deborah LodgeJuanita that the patient would return to ALF today.  Deborah LodgeJuanita is aware and agreeable to this dc.   Assessment/plan status:  Psychosocial Support/Ongoing Assessment of Needs Other assessment/ plan:   FL2  PASARR existing   Information/referral to community resources:   ALF    PATIENT'S/FAMILY'S RESPONSE TO PLAN OF CARE: patient and Deborah LodgeJuanita are agreeable for [facility] to [facility]       Deborah Gilbert, LCSWA [phone]  Psychiatric &  Orthopedics (5N 1-16) Clinical Social Worker

## 2015-01-11 NOTE — Clinical Social Work Note (Addendum)
11:21am- CSW received call from ALF requesting PT notes and updated dc summary to indicate stop date for RX Levaquin.  MD notified.  ALF currently reviewing patient for possible return.    10:24am- [facility] for [facility].  Vickii PennaGina Jaidon Sponsel, LCSWA [phone]  Psychiatric & Orthopedics (5N 1-16) Clinical Social Worker

## 2015-01-11 NOTE — Discharge Summary (Addendum)
Physician Discharge Summary  Deborah Gilbert ZOX:[id number] DOB: 24-Jan-1920 DOA: 01/07/2015  PCP: Evette Georges, MD  Admit date: 01/07/2015 Discharge date: 01/11/2015  Time spent: 35 minutes  Recommendations for Outpatient Follow-up:  1. Follow up with Dr. Tawanna Cooler in 3-4 weeks  2. Continue Levofloxacin 500 mg every 48 hours for 3 additional doses (6 additional days) with doses on 3/10, 3/12 and last dose on 3/14. Stop date on 3/14.  Discharge Diagnoses:  Principal Problem:   Rib pain Active Problems:   PAF history   Dementia without behavioral disturbance   CKD (chronic kidney disease) stage 2, GFR 60-89 ml/min   Anemia of chronic renal failure, stage 2 (mild)   Dehydration, mild   Abnormal urinalysis  Discharge Condition: stable  Diet recommendation: as tolerated   Filed Weights   01/07/15 1037  Weight: 40.824 kg (90 lb)    History of present illness:  Deborah Gilbert is a 81 y.o. female, dementia, stage II kidney disease with associated anemia, recurrent pleuritic chest pain, very hard of hearing, and paroxysmal atrial fibrillation. Patient was sent to the ER from her nursing facility today with complaints of chest pain. Patient is an extremely poor historian due to her underlying dementia. ER physician documented patient reported pleuritic type chest discomfort. Chest x-ray in the ER was concerning for possible mild CHF. Patient denying current shortness of breath or respiratory symptoms. She was stable on room air with oxygen saturations 96-97%. Initial troponin was less than 0.03. Because of the chest discomfort a d-dimer was checked and this was marginally elevated at 1.7. CT angiogram of the chest was ordered; this did not demonstrate a pulmonary embolism but questioned degree of pulmonary hypertension and also question inflammatory changes immediately inferior to the stomach and abutting the tail of the pancreas. EKG revealed sinus rhythm without any definitive ischemic ST  changes. During my conversation with the patient in the emergency department she reports that for the past 3 days she has been very weak and was telling the staff at the nursing facility so that she could not walk. She states she thinks they got mad at her and decided to get her out of the bed. She states that the nurse that is normally very nice to her picked her up and accidentally squeezed her too hard. It was after that her chest began to hurt around the rib cage. Again history is very difficult to obtain from this patient given her underlying dementia but her chest wall pain was reproducible on palpation. She felt like after the symptoms began that she also had trouble breathing because it hurt to breathe.  Hospital Course:  UTI -h/o recurrent UTIs typically with either Enterobacter or Escherichia coli -Suspect her complaints of not being able to walk for 3 days with weakness likely related to occult UTI, improved, able to work with physical therapy - no fever or leukocytosis, urine culture with Enterobacter, sensitive to Levaquin, to continue for 6 additional days  Rib pain/chest pain - telemetry with sinus rhythm - consistent with musculoskeletal rib pain; likely related to unintentional trauma with moving the patient several days prior - troponin negative/CTA no PE. Dehydration, mild - Despite chest x-ray reporting mild CHF? clinically patient appears dry; no evidence of edema on CT of the chest - now with good oral intake  Severe malnutrition: nutrition consult, Continue House Shakes po TID CKD (chronic kidney disease) stage 2, GFR 60-89 ml/min - Current renal function at baseline Anemia of chronic renal failure,  stage 2 (mild) -Hemoglobin stable around baseline PAF history - Currently maintaining sinus rhythm - Given advanced age and likely fall risk not an appropriate candidate for anticoagulation  - beta blocker initially on hold due to soft blood pressure, resumed on 3/6 with bp  stablization. Dementia without behavioral disturbance  Procedures:  None    Consultations:  None   Discharge Exam: Filed Vitals:   01/10/15 0557 01/10/15 1230 01/10/15 2037 01/11/15 0521  BP: 145/93 117/87 127/53 122/68  Pulse: 102 81 50 52  Temp: 98.3 F (36.8 C) 99 F (37.2 C) 98.1 F (36.7 C) 98.3 F (36.8 C)  TempSrc: Axillary     Resp: 20 18 18 18   Height:      Weight:      SpO2: 93% 93% 91% 92%    General: NAD, demented Cardiovascular: RRR Respiratory: CTA biL  Discharge Instructions     Medication List    STOP taking these medications        amoxicillin-clavulanate 875-125 MG per tablet  Commonly known as:  AUGMENTIN     feeding supplement (ENSURE COMPLETE) Liqd      TAKE these medications        beta carotene w/minerals tablet  Take 1 tablet by mouth daily. OCUVITE     CALCIUM 600+D 600-400 MG-UNIT per tablet  Generic drug:  Calcium Carbonate-Vitamin D  Take 1 tablet by mouth daily.     Cranberry 250 MG Caps  Take 500 mg by mouth 2 (two) times daily.     levofloxacin 500 MG tablet  Commonly known as:  LEVAQUIN  Take 1 tablet (500 mg total) by mouth every other day.  Start taking on:  01/12/2015     magnesium hydroxide 400 MG/5ML suspension  Commonly known as:  MILK OF MAGNESIA  Take 15 mLs by mouth daily as needed for mild constipation. If no relief in 8 hours give 6oz of prune juice with milk of mag     meclizine 12.5 MG tablet  Commonly known as:  ANTIVERT  Take 12.5 mg by mouth daily.     metoprolol tartrate 25 MG tablet  Commonly known as:  LOPRESSOR  Take 1 tablet (25 mg total) by mouth 2 (two) times daily.         The results of significant diagnostics from this hospitalization (including imaging, microbiology, ancillary and laboratory) are listed below for reference.    Significant Diagnostic Studies: Dg Chest 2 View  01/07/2015   CLINICAL DATA:  Chest pain. Onset of symptoms this morning. Initial encounter.  EXAM: CHEST   2 VIEW  COMPARISON:  10/22/2014.  FINDINGS: Emphysema. Cardiopericardial silhouette upper limits of normal for projection. Tortuous thoracic aorta with arch atherosclerotic calcification. Bilateral pleural apical thickening/ scarring. Extreme RIGHT lung apex excluded from view. Diffuse interstitial prominence is present consistent with interstitial pulmonary edema. Kerley B lines are present at the periphery. Scattered areas of subsegmental atelectasis. No gross lobar consolidation. No effusions.  IMPRESSION: Mild CHF superimposed on emphysema.   Electronically Signed   By: Andreas NewportGeoffrey  Lamke M.D.   On: 01/07/2015 12:18   Ct Angio Chest Pe W/cm &/or Wo Cm  01/07/2015   CLINICAL DATA:  Central and left-sided chest pain. Elevated D-dimer.  EXAM: CT ANGIOGRAPHY CHEST WITH CONTRAST  TECHNIQUE: Multidetector CT imaging of the chest was performed using the standard protocol during bolus administration of intravenous contrast. Multiplanar CT image reconstructions and MIPs were obtained to evaluate the vascular anatomy.  CONTRAST:  80mL OMNIPAQUE  IOHEXOL 350 MG/ML SOLN  COMPARISON:  Chest x-ray earlier today.  FINDINGS: The pulmonary arteries are well opacified. There is no evidence of pulmonary embolism. Dilatation of central pulmonary arteries present, likely reflecting some degree of pulmonary hypertension. The main pulmonary artery measures up to 3.5 cm in diameter.  The thoracic aorta is also well opacified. No evidence of thoracic aortic aneurysmal disease. Proximal great vessels are normally patent.  Lungs show scattered areas of parenchymal scarring bilaterally as well as atelectasis at both lung bases. There is no evidence of consolidation, edema, nodule or pneumothorax. No pleural or pericardial fluid is seen. The heart size is normal.  Coronary atherosclerosis present with mild amount of calcified plaque at the level of the distal left main coronary artery extending into the proximal LAD.  The visualized upper  abdomen shows potential inflammatory changes just inferior to the stomach and abutting the tail of the pancreas. Correlation suggested with the possibility of pancreatitis or gastritis/peptic disease. The bony thorax shows osteopenia and mild spondylosis of the thoracic spine.  Review of the MIP images confirms the above findings.  IMPRESSION: 1. No evidence of pulmonary embolism. 2. Probable degree of underlying pulmonary hypertension with dilatation of the central pulmonary arteries. 3. Normal thoracic aorta. 4. Coronary atherosclerosis with mild amount of calcified plaque at the level of the distal left main and proximal LAD. 5. Potential inflammatory changes immediately inferior to the stomach and abutting the tail of the pancreas. Correlation suggested with any potential abdominal etiology for this finding including pancreatitis or gastric pathology.   Electronically Signed   By: Irish Lack M.D.   On: 01/07/2015 14:07    Microbiology: Recent Results (from the past 240 hour(s))  Urine culture     Status: None   Collection Time: 01/07/15  1:50 PM  Result Value Ref Range Status   Specimen Description URINE, RANDOM  Final   Special Requests NONE  Final   Colony Count   Final    >=100,000 COLONIES/ML Performed at [facility]    Culture   Final    ENTEROBACTER SPECIES Performed at [facility]    Report Status 01/10/2015 FINAL  Final   Organism ID, Bacteria ENTEROBACTER SPECIES  Final      Susceptibility   Enterobacter species - MIC*    CEFAZOLIN >=64 RESISTANT Resistant     CEFTRIAXONE <=1 SENSITIVE Sensitive     CIPROFLOXACIN <=0.25 SENSITIVE Sensitive     GENTAMICIN <=1 SENSITIVE Sensitive     NITROFURANTOIN 32 SENSITIVE Sensitive     TOBRAMYCIN <=1 SENSITIVE Sensitive     TRIMETH/SULFA 40 SENSITIVE Sensitive     PIP/TAZO <=4 SENSITIVE Sensitive     * ENTEROBACTER SPECIES     Labs: Basic Metabolic Panel:  Recent Labs Lab 01/07/15 1018 01/08/15 0142  01/10/15 0541 01/11/15 0605  NA 141 141 139 142  K 3.4* 4.5 4.8 4.5  CL 109 110 108 107  CO2 GLUCOSE 112* 128* 103* 99  BUN 25*  CREATININE 0.75 0.79 0.69 0.88  CALCIUM 7.8* 8.5 8.0* 9.0   Liver Function Tests:  Recent Labs Lab 01/07/15 1018 01/08/15 0142  AST 19 18  ALT 13 14  ALKPHOS 75 82  BILITOT 0.6 0.7  PROT 5.2* 5.4*  ALBUMIN 2.5* 2.6*    Recent Labs Lab 01/07/15 1018  LIPASE 26   CBC:  Recent Labs Lab 01/07/15 1018 01/08/15 0142 01/10/15 0541 01/11/15 0605  WBC 6.1  6.0 6.1 5.8  NEUTROABS 4.4  --   --   --   HGB 11.1* 11.0* 11.1* 11.8*  HCT 33.9* 34.2* 33.7* 37.0  MCV 90.4 90.0 87.8 90.9  PLT 191 167 206 198   Cardiac Enzymes:  Recent Labs Lab 01/07/15 1018 01/07/15 2104 01/08/15 0142 01/08/15 0740  TROPONINI <0.03 <0.03 <0.03 <0.03   BNP: BNP (last 3 results)  Recent Labs  01/07/15 1307  BNP 137.9*    CBG:  Recent Labs Lab 01/07/15 2143 01/08/15 0622  GLUCAP 106* 94    Signed:  GHERGHE, COSTIN  [facility] 01/11/2015, 10:59 AM

## 2015-05-29 ENCOUNTER — Inpatient Hospital Stay (HOSPITAL_COMMUNITY): Payer: Medicare Other

## 2015-05-29 ENCOUNTER — Inpatient Hospital Stay (HOSPITAL_COMMUNITY)
Admission: EM | Admit: 2015-05-29 | Discharge: 2015-05-31 | DRG: 948 | Disposition: A | Discharge status: Skilled Nursing Facility | Payer: Medicare Other | Attending: Internal Medicine | Admitting: Internal Medicine

## 2015-05-29 ENCOUNTER — Encounter (HOSPITAL_COMMUNITY): Payer: Self-pay | Admitting: Physical Medicine and Rehabilitation

## 2015-05-29 ENCOUNTER — Emergency Department (HOSPITAL_COMMUNITY): Payer: Medicare Other

## 2015-05-29 DIAGNOSIS — R1310 Dysphagia, unspecified: Secondary | ICD-10-CM

## 2015-05-29 DIAGNOSIS — E860 Dehydration: Secondary | ICD-10-CM | POA: Diagnosis not present

## 2015-05-29 DIAGNOSIS — G936 Cerebral edema: Secondary | ICD-10-CM | POA: Diagnosis present

## 2015-05-29 DIAGNOSIS — I639 Cerebral infarction, unspecified: Secondary | ICD-10-CM | POA: Diagnosis not present

## 2015-05-29 DIAGNOSIS — Z8673 Personal history of transient ischemic attack (TIA), and cerebral infarction without residual deficits: Secondary | ICD-10-CM | POA: Diagnosis not present

## 2015-05-29 DIAGNOSIS — E785 Hyperlipidemia, unspecified: Secondary | ICD-10-CM | POA: Diagnosis present

## 2015-05-29 DIAGNOSIS — I6340 Cerebral infarction due to embolism of unspecified cerebral artery: Secondary | ICD-10-CM | POA: Diagnosis not present

## 2015-05-29 DIAGNOSIS — I10 Essential (primary) hypertension: Secondary | ICD-10-CM | POA: Diagnosis present

## 2015-05-29 DIAGNOSIS — R29810 Facial weakness: Secondary | ICD-10-CM | POA: Diagnosis present

## 2015-05-29 DIAGNOSIS — H109 Unspecified conjunctivitis: Secondary | ICD-10-CM | POA: Diagnosis present

## 2015-05-29 DIAGNOSIS — I480 Paroxysmal atrial fibrillation: Secondary | ICD-10-CM | POA: Diagnosis present

## 2015-05-29 DIAGNOSIS — F0390 Unspecified dementia without behavioral disturbance: Secondary | ICD-10-CM | POA: Diagnosis present

## 2015-05-29 DIAGNOSIS — G8191 Hemiplegia, unspecified affecting right dominant side: Secondary | ICD-10-CM | POA: Diagnosis present

## 2015-05-29 DIAGNOSIS — Z66 Do not resuscitate: Secondary | ICD-10-CM | POA: Diagnosis present

## 2015-05-29 DIAGNOSIS — I63032 Cerebral infarction due to thrombosis of left carotid artery: Secondary | ICD-10-CM | POA: Insufficient documentation

## 2015-05-29 DIAGNOSIS — H9190 Unspecified hearing loss, unspecified ear: Secondary | ICD-10-CM

## 2015-05-29 DIAGNOSIS — Z993 Dependence on wheelchair: Secondary | ICD-10-CM

## 2015-05-29 DIAGNOSIS — N182 Chronic kidney disease, stage 2 (mild): Secondary | ICD-10-CM | POA: Diagnosis present

## 2015-05-29 DIAGNOSIS — R4182 Altered mental status, unspecified: Principal | ICD-10-CM | POA: Diagnosis present

## 2015-05-29 DIAGNOSIS — M1990 Unspecified osteoarthritis, unspecified site: Secondary | ICD-10-CM | POA: Diagnosis present

## 2015-05-29 DIAGNOSIS — I6789 Other cerebrovascular disease: Secondary | ICD-10-CM | POA: Diagnosis not present

## 2015-05-29 DIAGNOSIS — N390 Urinary tract infection, site not specified: Secondary | ICD-10-CM | POA: Diagnosis present

## 2015-05-29 DIAGNOSIS — H9191 Unspecified hearing loss, right ear: Secondary | ICD-10-CM | POA: Diagnosis present

## 2015-05-29 LAB — URINALYSIS, ROUTINE W REFLEX MICROSCOPIC
Glucose, UA: NEGATIVE mg/dL
Ketones, ur: 40 mg/dL — AB
NITRITE: NEGATIVE
PH: 5.5 (ref 5.0–8.0)
PROTEIN: NEGATIVE mg/dL
Specific Gravity, Urine: 1.022 (ref 1.005–1.030)
Urobilinogen, UA: 1 mg/dL (ref 0.0–1.0)

## 2015-05-29 LAB — CBC
HEMATOCRIT: 37.3 % (ref 36.0–46.0)
Hemoglobin: 12.2 g/dL (ref 12.0–15.0)
MCH: 29.5 pg (ref 26.0–34.0)
MCHC: 32.7 g/dL (ref 30.0–36.0)
MCV: 90.3 fL (ref 78.0–100.0)
PLATELETS: 160 10*3/uL (ref 150–400)
RBC: 4.13 MIL/uL (ref 3.87–5.11)
RDW: 14.1 % (ref 11.5–15.5)
WBC: 7.1 10*3/uL (ref 4.0–10.5)

## 2015-05-29 LAB — DIFFERENTIAL
BASOS ABS: 0 10*3/uL (ref 0.0–0.1)
Basophils Relative: 0 % (ref 0–1)
Eosinophils Absolute: 0 10*3/uL (ref 0.0–0.7)
Eosinophils Relative: 0 % (ref 0–5)
LYMPHS ABS: 0.6 10*3/uL — AB (ref 0.7–4.0)
Lymphocytes Relative: 8 % — ABNORMAL LOW (ref 12–46)
Monocytes Absolute: 0.5 10*3/uL (ref 0.1–1.0)
Monocytes Relative: 7 % (ref 3–12)
Neutro Abs: 6 10*3/uL (ref 1.7–7.7)
Neutrophils Relative %: 85 % — ABNORMAL HIGH (ref 43–77)

## 2015-05-29 LAB — COMPREHENSIVE METABOLIC PANEL
ALT: 10 U/L — ABNORMAL LOW (ref 14–54)
AST: 15 U/L (ref 15–41)
Albumin: 2.7 g/dL — ABNORMAL LOW (ref 3.5–5.0)
Alkaline Phosphatase: 59 U/L (ref 38–126)
Anion gap: 9 (ref 5–15)
BUN: 23 mg/dL — ABNORMAL HIGH (ref 6–20)
CALCIUM: 8.4 mg/dL — AB (ref 8.9–10.3)
CO2: 22 mmol/L (ref 22–32)
Chloride: 109 mmol/L (ref 101–111)
Creatinine, Ser: 0.79 mg/dL (ref 0.44–1.00)
GFR calc Af Amer: >60 mL/min (ref >60)
GFR calc non Af Amer: >60 mL/min (ref >60)
Glucose, Bld: 130 mg/dL — ABNORMAL HIGH (ref 65–99)
Potassium: 3.6 mmol/L (ref 3.5–5.1)
Sodium: 140 mmol/L (ref 135–145)
Total Bilirubin: 0.7 mg/dL (ref 0.3–1.2)
Total Protein: 5.1 g/dL — ABNORMAL LOW (ref 6.5–8.1)

## 2015-05-29 LAB — URINE MICROSCOPIC-ADD ON

## 2015-05-29 LAB — I-STAT CHEM 8, ED
BUN: 26 mg/dL — ABNORMAL HIGH (ref 6–20)
CHLORIDE: 106 mmol/L (ref 101–111)
Calcium, Ion: 1.08 mmol/L — ABNORMAL LOW (ref 1.13–1.30)
Creatinine, Ser: 0.8 mg/dL (ref 0.44–1.00)
Glucose, Bld: 129 mg/dL — ABNORMAL HIGH (ref 65–99)
HCT: 37 % (ref 36.0–46.0)
Hemoglobin: 12.6 g/dL (ref 12.0–15.0)
Potassium: 3.6 mmol/L (ref 3.5–5.1)
SODIUM: 141 mmol/L (ref 135–145)
TCO2: 20 mmol/L (ref 0–100)

## 2015-05-29 LAB — I-STAT TROPONIN, ED: Troponin i, poc: 0 ng/mL (ref 0.00–0.08)

## 2015-05-29 LAB — RAPID URINE DRUG SCREEN, HOSP PERFORMED
Amphetamines: NOT DETECTED
BARBITURATES: NOT DETECTED
Benzodiazepines: NOT DETECTED
COCAINE: NOT DETECTED
OPIATES: NOT DETECTED
TETRAHYDROCANNABINOL: NOT DETECTED

## 2015-05-29 LAB — ETHANOL: Alcohol, Ethyl (B): <5 mg/dL (ref <5)

## 2015-05-29 LAB — PROTIME-INR
INR: 1.14 (ref 0.00–1.49)
Prothrombin Time: 14.8 seconds (ref 11.6–15.2)

## 2015-05-29 LAB — APTT: aPTT: 29 seconds (ref 24–37)

## 2015-05-29 LAB — CBG MONITORING, ED: Glucose-Capillary: 127 mg/dL — ABNORMAL HIGH (ref 65–99)

## 2015-05-29 MED ORDER — ASPIRIN 300 MG RE SUPP
300.0000 mg | Freq: Every day | RECTAL | Status: DC
  Administered 2015-05-29 – 2015-05-31 (×3): 300 mg via RECTAL
  Filled 2015-05-29 (×3): qty 1

## 2015-05-29 MED ORDER — ASPIRIN 325 MG PO TABS: 325.0000 mg | ORAL_TABLET | Freq: Every day | ORAL | Status: DC

## 2015-05-29 MED ORDER — ERYTHROMYCIN 5 MG/GM OP OINT
TOPICAL_OINTMENT | Freq: Four times a day (QID) | OPHTHALMIC | Status: DC
  Administered 2015-05-29 – 2015-05-30 (×4): via OPHTHALMIC
  Administered 2015-05-30 – 2015-05-31 (×3): 1 via OPHTHALMIC
  Administered 2015-05-31: 14:00:00 via OPHTHALMIC
  Filled 2015-05-29: qty 3.5

## 2015-05-29 MED ORDER — MORPHINE SULFATE 2 MG/ML IJ SOLN: 1.0000 mg | INTRAMUSCULAR | Status: DC | PRN

## 2015-05-29 MED ORDER — HEPARIN SODIUM (PORCINE) 5000 UNIT/ML IJ SOLN
5000.0000 [IU] | Freq: Three times a day (TID) | INTRAMUSCULAR | Status: DC
  Administered 2015-05-29 – 2015-05-31 (×7): 5000 [IU] via SUBCUTANEOUS
  Filled 2015-05-29 (×7): qty 1

## 2015-05-29 MED ORDER — SODIUM CHLORIDE 0.9 % IV SOLN
INTRAVENOUS | Status: DC
  Administered 2015-05-29 – 2015-05-30 (×2): via INTRAVENOUS

## 2015-05-29 MED ORDER — STROKE: EARLY STAGES OF RECOVERY BOOK
Freq: Once | Status: AC
  Administered 2015-05-29: 19:00:00
  Filled 2015-05-29: qty 1

## 2015-05-29 MED ORDER — ASPIRIN EC 81 MG PO TBEC: 81.0000 mg | DELAYED_RELEASE_TABLET | Freq: Every day | ORAL | Status: DC

## 2015-05-29 NOTE — Code Documentation (Signed)
81yo female arriving to [facility] via [facility] at 69.  EMS reports that patient was noted to be slumped to the right in her wheelchair at 1158 by facility staff.  EMS called and assessed right facial droop and activated a Code Stroke.  Stroke team at the bedside on arrival.  Labs drawn and patient cleared by Dr. Fayrene Fearing.  Patient to CT with team.  NIHSS 15, see documentation for details and code stroke times.  Patient is not oriented and does not follow commands on exam.  Stroke RN called facility and spoke with patient's nurse who reports that the patient was able to eat breakfast and take her medications at 0800 per her usual.  She was then assisted to the recliner by the nurse aid at 0900.  The nurse aid made rounds and the patient was noted to be asleep.  At 1155 the nurse aid went to assist the patient to her wheelchair where she was leaning to the right.  Patient is nonambulatory and wheelchair bound at baseline.  Patient is alert and conversant at baseline.  Difficult to determine LKW.  Dr. Leroy Kennedy at the bedside and made aware of report from facility.  No acute stroke treatment at this time per MD.  Bedside handoff with ED RN Aundra Millet.

## 2015-05-29 NOTE — ED Provider Notes (Signed)
CSN: [id number]     Arrival date & time 05/29/15  1240 History   First MD Initiated Contact with Patient 05/29/15 1245     Chief Complaint  Patient presents with  . Code Stroke  . Altered Mental Status    @EDPCLEARED @  HPI  Patient presents via [facility] for altered mental status. Patient resides at [facility].  Her last time known normal is approximately 8 AM. She was helped from her wheelchair to another chair.  At 1155 she was noted to be leaning to her right. She was unable to speak initially. Had weakness and is unable to lift her head. Altered is transferred here. He was made a "code stroke" due to her altered mental status in route by paramedics.  Patient does not have any CODE STATUS. Does not had a DO NOT RESUSCITATE. Does not have a health care proxy or power of attorney or living will. I discussed the patient with the staff at [facility], as well as with the nurse practitioner for Dr. Redmond School her primary care physician. I attempted to make contact with patient listed contact. a friend missed Smith. No contact able to be initiated.  Past Medical History  Diagnosis Date  . Painful respiration   . Effusion of lower leg joint   . Dysuria   . Family history of malignant neoplasm of gastrointestinal tract   . Diarrhea   . Painful respiration   . Effusion of lower leg joint   . HOH (hard of hearing)   . Osteoarthritis   . Macular degeneration    Past Surgical History  Procedure Laterality Date  . Hip sx-right  1993  . Child birth    . Catract surgery/bilateral    . Appendectomy     No family history on file. History  Substance Use Topics  . Smoking status: Unknown If Ever Smoked  . Smokeless tobacco: Not on file  . Alcohol Use: No   OB History    No data available     Review of Systems  Unable to perform ROS: Acuity of condition      Allergies  Famotidine; Pepcid; Tetracycline; Tetracyclines & related; and Trimethoprim  Home Medications   Prior to  Admission medications   Medication Sig Start Date End Date Taking? Authorizing Provider  beta carotene w/minerals (OCUVITE) tablet Take 1 tablet by mouth daily. OCUVITE 01/11/15   Costin Otelia Sergeant, MD  Calcium Carbonate-Vitamin D (CALCIUM 600+D) 600-400 MG-UNIT per tablet Take 1 tablet by mouth daily.    Historical Provider, MD  Cranberry 250 MG CAPS Take 500 mg by mouth 2 (two) times daily.    Historical Provider, MD  levofloxacin (LEVAQUIN) 500 MG tablet Take 1 tablet (500 mg total) by mouth every other day. 01/12/15   Costin Otelia Sergeant, MD  magnesium hydroxide (MILK OF MAGNESIA) 400 MG/5ML suspension Take 15 mLs by mouth daily as needed for mild constipation. If no relief in 8 hours give 6oz of prune juice with milk of mag    Historical Provider, MD  meclizine (ANTIVERT) 12.5 MG tablet Take 12.5 mg by mouth daily.     Historical Provider, MD  metoprolol tartrate (LOPRESSOR) 25 MG tablet Take 1 tablet (25 mg total) by mouth 2 (two) times daily. Patient not taking: Reported on 10/22/2014 08/20/14   [name], MD   BP 119/56 mmHg  Pulse 75  Temp(Src) 97.9 F (36.6 C) (Oral)  Resp 20  Ht 5' (1.524 m)  Wt  44 lb 7 oz (20.157 kg)  BMI 8.68 kg/m2  SpO2 97% Physical Exam  Constitutional:  Elderly-appearing female. Her head is tilted to the right.  HENT:  Extremely hard of hearing. No trauma to the head.  Eyes:  3 mm symmetric reactive. No fixed gaze.  Neck:  Head and neck tilted to the right. No JVD or bruits.  Cardiovascular:  Sinus rhythm. Regular. Not tachycardic.  Pulmonary/Chest:  Clear bilateral breath sounds.  Abdominal:  Soft benign abdomen.  Neurological:  Is able to answer some questions with one-word. Germain closed. Responds to noxious stimuli. Does withdraw. Will grip bilaterally.    ED Course  Procedures (including critical care time) Labs Review Labs Reviewed  DIFFERENTIAL - Abnormal; Notable for the following:    Neutrophils Relative % 85 (*)    Lymphocytes  Relative 8 (*)    Lymphs Abs 0.6 (*)    All other components within normal limits  COMPREHENSIVE METABOLIC PANEL - Abnormal; Notable for the following:    Glucose, Bld 130 (*)    BUN 23 (*)    Calcium 8.4 (*)    Total Protein 5.1 (*)    Albumin 2.7 (*)    ALT 10 (*)    All other components within normal limits  I-STAT CHEM 8, ED - Abnormal; Notable for the following:    BUN 26 (*)    Glucose, Bld 129 (*)    Calcium, Ion 1.08 (*)    All other components within normal limits  CBG MONITORING, ED - Abnormal; Notable for the following:    Glucose-Capillary 127 (*)    All other components within normal limits  ETHANOL  PROTIME-INR  APTT  CBC  URINE RAPID DRUG SCREEN, HOSP PERFORMED  URINALYSIS, ROUTINE W REFLEX MICROSCOPIC (NOT AT [facility])  I-STAT TROPOININ, ED    Imaging Review Ct Head Wo Contrast  05/29/2015   CLINICAL DATA:  Found slumped over by staff earlier today. Unknown last seen normal time. Altered mental status. Code stroke.  EXAM: CT HEAD WITHOUT CONTRAST  TECHNIQUE: Contiguous axial images were obtained from the base of the skull through the vertex without intravenous contrast.  COMPARISON:  None.  FINDINGS: Asymmetric and increased hyperattenuation of the LEFT ICA terminus could represent acute large vessel occlusion; see image 14 series 3.  Asymmetric hypoattenuation LEFT posterior limb internal capsule, with possible involvement of surrounding deep nuclei and white matter best demonstrated on image 17 series 3 could represent early cytotoxic edema. Possible involvement of the LEFT insula. Correlate clinically for RIGHT-sided weakness.  Global atrophy. Generalized hypoattenuation of white matter representing chronic small vessel disease. No acute hemorrhage, intracranial mass lesion, or extra-axial fluid. Calvarium intact. BILATERAL cataract extraction. Chronic sinus disease. Hypoplastic LEFT mastoid due to asymmetrically enlarged transverse and sigmoid sinus on that side.   IMPRESSION: Possible large vessel occlusion with asymmetric hyperattenuation LEFT ICA terminus.  Suspected early cytotoxic edema posterior limb internal capsule and insula also on the LEFT. No hemorrhage.  ASPECTS score = 8 (insula, internal capsule)  [place] Stroke Program Early CT Score  Normal score = 10  Critical Value/emergent results were called by telephone at the time of interpretation on 05/29/2015 at 12:59 PM to Dr. Leroy Kennedy, who verbally acknowledged these results.   Electronically Signed   By: Elsie Stain M.D.   On: 05/29/2015 13:11     EKG Interpretation   Date/Time:  Monday May 29 2015 13:01:08 EDT Ventricular Rate:  83 PR Interval:    QRS Duration: 89  QT Interval:  402 QTC Calculation: 472 R Axis:   83 Text Interpretation:  Sinus rhythm Borderline right axis deviation  Nonspecific T abnormalities, lateral leads Confirmed by Fayrene Fearing  MD, Roya Gieselman  [phone]) on 05/29/2015 1:04:00 PM      MDM   Final diagnoses:  Cerebral infarction due to thrombosis of left carotid artery    Patient will large vessel left-sided stroke consistent with ICA terminus stroke in early cortical changes and edema.  Due to her last time no normal patient is not a candidate for lytic therapy. I placed a call to unassigned medicine regarding admission. Patient seen by myself, as well as Dr. Kirk Ruths of neurology. He recommends conservative treatment.     Rolland Porter, MD 05/29/15 1344

## 2015-05-29 NOTE — Clinical Social Work Note (Signed)
Clinical Social Work Assessment  Patient Details ALAISHA EVERSLEY L Gilbert MRN: [id number] Date of Birth: 04-26-1920  Date of referral:  05/29/15               Reason for consult:  Other (Comment Required) (admitted from a facility)                Permission sought to share information with:  Case Manager, Deborah Gilbert, Other (friend) Permission granted to share information::  Yes, Verbal Permission Granted  Name::     Deborah Gilbert Deborah Gilbert  Agency::  Deborah Gilbert  Relationship::  Friend Electrical engineer Information:     Housing/Transportation Living arrangements for the past 2 months:  Assisted Dealer of Information:  Medical Team, Facility Patient Interpreter Needed:  None Criminal Activity/Legal Involvement Pertinent to Current Situation/Hospitalization:  No - Comment as needed Significant Relationships:  Friend, Other(Comment) (Facility is her family per Deborah Gilbert) Lives with:  Facility Resident Do you feel safe going back to the place where you live?  Yes Need for family participation in patient care:  Yes (Comment) (Friend or Facility)  Care giving concerns:  Patient in with MD and working with RN.  Patient very hard of hearing and a new stroke diagnosis with limited information to be given. Facility called and aware of patient in hospital.  Facility reports no family involvement, reports on family friend Deborah Gilbert who has been contacted by MD regarding prognosis and medical work up. No concerns at this time.   Social Worker assessment / plan:  LCSW following due to patient being admitted from a facility. Patient is a new stroke and living at [facility]. Patient is a long term resident of the facility and facility reports "we are her family as she does not have any".  Facility asking for updates, working to get in touch with patient friend as she will help make decisions for patient.  Patient is her own POA, no other family or friends involved. Facility aware of  patient being admitted. Discussed with MD treatment plan with possibly the option/discussion of comfort care for patient.   FL2 updated and will need to be updated again prior to return.  Unit CSW to follow up as ED will hand off.  Employment status:  Retired Database administrator PT Recommendations:  Not assessed at this time Information / Referral to community resources:  Other (Comment Required) (None at this time)  Patient/Family's Response to care:  Agreeable to plan  Patient/Family's Understanding of and Emotional Response to Diagnosis, Current Treatment, and Prognosis:  Deborah Gilbert and facility express concern and questions regarding outcome of patient. Deborah Gilbert reports she will be involved if patient is asking for assistance, but has a sick husband herself. Facility can also be default for help with decision making.  Patient very hard of hearing and difficult to obtain information at this time.  Emotional Assessment Appearance:  Appears stated age Attitude/Demeanor/Rapport:  Lethargic Affect (typically observed):  Withdrawn, Quiet Orientation:  Oriented to Self Alcohol / Substance use:  Not Applicable Psych involvement (Current and /or in the community):  No (Comment)  Discharge Needs  Concerns to be addressed:  Adjustment to Illness, Decision making concerns Readmission within the last 30 days:  No Current discharge risk:  Chronically ill Barriers to Discharge:  Continued Medical Work up   Deborah Sorrow, LCSW 05/29/2015, 2:26 PM

## 2015-05-29 NOTE — H&P (Signed)
[facility] History and Physical                                                                                    Deborah Gilbert, is a 81 y.o. female  MRN: [id number]   DOB - 1920/05/10  Admit Date - 05/29/2015  Outpatient Primary MD for the patient is [name],[name] [name], MD  Referring Physician:  Dr. Fayrene Fearing  Chief Complaint:   Chief Complaint  Patient presents with  . Code Stroke  . Altered Mental Status     HPI  Deborah Gilbert  is a 81 y.o. female, with paroxysmal atrial fibrillation, hearing loss, dementia, and chronic kidney disease stage II. She presents to the emergency department today with stroke. The patient is unable to give history secondary to severe hearing loss and dementia. History was gathered from the ER physician and the patient's close family friend [name]. Per [name] the patient has not been doing well for the past 2 weeks. She is wheelchair-bound and has to have feeding assistance. For the last 2 weeks she has sounded "less intelligent" and needed more assistance. Per the skilled nursing facility she was last seen normal at 8 AM this morning. At approximately 1155 and she was found leaning to the right, unable to speak and not moving her right side. In the emergency department a CT scan of her head shows a large vessel occlusion in the left ICA and suspected early cytotoxic edema posterior limb internal capsule and insula on the left.  Per [name] the patient has no family and no POA. Ms. Katrinka Blazing states that she believes the patient would want to be a DO NOT RESUSCITATE.  Review of Systems   The patient is unable to give a review of systems due to dementia and stroke and hearing loss.  Past Medical History  Past Medical History  Diagnosis Date  . Painful respiration   . Effusion of lower leg joint   . Dysuria   . Family history of malignant neoplasm of gastrointestinal tract   . Diarrhea   . Painful respiration   . Effusion of lower leg  joint   . HOH (hard of hearing)   . Osteoarthritis   . Macular degeneration     Past Surgical History  Procedure Laterality Date  . Hip sx-right  1993  . Child birth    . Catract surgery/bilateral    . Appendectomy        Social History History  Substance Use Topics  . Smoking status: Unknown If Ever Smoked  . Smokeless tobacco: Not on file  . Alcohol Use: No   per [name] the patient is wheelchair-bound and dependent with her ADLs.  Family History The patient is unable to give a family history due to stroke, dementia, and hearing loss.  Prior to Admission medications   Medication Sig Start Date End Date Taking? Authorizing Provider  beta carotene w/minerals (OCUVITE) tablet Take 1 tablet by mouth daily. OCUVITE 01/11/15  Yes Costin Otelia Sergeant, MD  Calcium Carbonate-Vitamin D (CALCIUM 600+D) 600-400 MG-UNIT per tablet Take 1 tablet by mouth daily.   Yes Historical Provider, MD  Cranberry 250  MG CAPS Take 500 mg by mouth 2 (two) times daily.   Yes Historical Provider, MD  meclizine (ANTIVERT) 12.5 MG tablet Take 12.5 mg by mouth daily.    Yes Historical Provider, MD  metoprolol tartrate (LOPRESSOR) 25 MG tablet Take 1 tablet (25 mg total) by mouth 2 (two) times daily. 08/20/14  Yes [name], MD  levofloxacin (LEVAQUIN) 500 MG tablet Take 1 tablet (500 mg total) by mouth every other day. Patient not taking: Reported on 05/29/2015 01/12/15   Leatha Gilding, MD  magnesium hydroxide (MILK OF MAGNESIA) 400 MG/5ML suspension Take 15 mLs by mouth daily as needed for mild constipation. If no relief in 8 hours give 6oz of prune juice with milk of mag    Historical Provider, MD    Allergies  Allergen Reactions  . Famotidine     REACTION: "felt like a heart attack"  . Pepcid [Famotidine] Other (See Comments)    Called patient's nursing home and they do not have any reactions known  . Tetracycline     REACTION: Severe nausea and vomiting  . Tetracyclines & Related      Unknown   . Trimethoprim     Unknown     Physical Exam  Vitals  Blood pressure 119/56, pulse 75, temperature 97.9 F (36.6 C), temperature source Oral, resp. rate 20, height 5' (1.524 m), weight 20.157 kg (44 lb 7 oz), SpO2 97 %.   General: Thin, frail, elderly female lying in bed.  Very hard of hearing. Alert to person only  Psych: Demented, alert to person not place or time. Cooperative.  Neuro:   Patient is leaning right, she is able to move both of her feet, she did not move her right upper extremity, she is unable to follow commands.  ENT:  Ears have excess wax, eyes are crusted, conjunctiva are pink  Neck:  Supple, No lymphadenopathy appreciated  Respiratory:  Symmetrical chest wall movement, no frank wheezes crackles or rales  Cardiac:  RRR, No Murmurs, no LE edema noted, no JVD.  Patient appears dry   Abdomen:  Thin, decreased bowel sounds, Soft, Non tender, Non distended,  No masses appreciated  Skin:  Poor skin turgor, multiple bruises on her arms.  Extremities:  No edema. Unable to move right upper extremity.  Data Review  CBC  Recent Labs Lab 05/29/15 1240 05/29/15 1248  WBC 7.1  --   HGB 12.2 12.6  HCT 37.3 37.0  PLT 160  --   MCV 90.3  --   MCH 29.5  --   MCHC 32.7  --   RDW 14.1  --   LYMPHSABS 0.6*  --   MONOABS 0.5  --   EOSABS 0.0  --   BASOSABS 0.0  --     Chemistries   Recent Labs Lab 05/29/15 1240 05/29/15 1248  NA 140 141  K 3.6 3.6  CL 109 106  CO2 22  --   GLUCOSE 130* 129*  BUN 23* 26*  CREATININE 0.79 0.80  CALCIUM 8.4*  --   AST 15  --   ALT 10*  --   ALKPHOS 59  --   BILITOT 0.7  --     Coagulation profile  Recent Labs Lab 05/29/15 1240  INR 1.14      Imaging results:   Ct Head Wo Contrast  05/29/2015   CLINICAL DATA:  Found slumped over by staff earlier today. Unknown last seen normal time. Altered mental status. Code stroke.  EXAM: CT HEAD WITHOUT CONTRAST  TECHNIQUE: Contiguous axial images were  obtained from the base of the skull through the vertex without intravenous contrast.  COMPARISON:  None.  FINDINGS: Asymmetric and increased hyperattenuation of the LEFT ICA terminus could represent acute large vessel occlusion; see image 14 series 3.  Asymmetric hypoattenuation LEFT posterior limb internal capsule, with possible involvement of surrounding deep nuclei and white matter best demonstrated on image 17 series 3 could represent early cytotoxic edema. Possible involvement of the LEFT insula. Correlate clinically for RIGHT-sided weakness.  Global atrophy. Generalized hypoattenuation of white matter representing chronic small vessel disease. No acute hemorrhage, intracranial mass lesion, or extra-axial fluid. Calvarium intact. BILATERAL cataract extraction. Chronic sinus disease. Hypoplastic LEFT mastoid due to asymmetrically enlarged transverse and sigmoid sinus on that side.  IMPRESSION: Possible large vessel occlusion with asymmetric hyperattenuation LEFT ICA terminus.  Suspected early cytotoxic edema posterior limb internal capsule and insula also on the LEFT. No hemorrhage.  ASPECTS score = 8 (insula, internal capsule)  [place] Stroke Program Early CT Score  Normal score = 10  Critical Value/emergent results were called by telephone at the time of interpretation on 05/29/2015 at 12:59 PM to Dr. Leroy Kennedy, who verbally acknowledged these results.   Electronically Signed   By: Elsie Stain M.D.   On: 05/29/2015 13:11    My personal review of EKG: Sinus rhythm, no prolonged QT.   Assessment & Plan  Principal Problem:   CVA (cerebral infarction) Active Problems:   Hearing loss   PAF history   Dementia without behavioral disturbance   CKD (chronic kidney disease) stage 2, GFR 60-89 ml/min   Dysphagia   Dehydration, mild   Left sided CVA with cytotoxic edema affecting the patient's right side Neurology has consulted and recommends conservative treatment. We'll start on 325 mg aspirin. Cancel  MRI/MRA as it will not change management. PT/OT's/speech eval.  Patient has a history of dysphagia. She was already unable to feed herself. 2-D echo pending Per close family friend patient is a DO NOT RESUSCITATE.  I recommend the patient be evaluated for hospice services.  Social work consultation order placed. I believe this patient may be best served with comfort care measures.  Paroxysmal atrial fibrillation Will place on full dose aspirin.  Holding oral medications at this point.  Will restart metoprolol when able. Patient was not on any anticoagulation prior to admission.  Chronic kidney disease. Creatinine at baseline.  Previous history of dysphagia Speech eval pending.  Gentle IV fluids  Severe hearing loss In order to be heard please speak into the patient's right ear with a raised voice.  Conjunctivitis Will order erythromycin eye ointment and warm compresses.    Consultants Called:  Neurology  Family Communication:   [name], close family friend. [phone] or [phone] Per [name] the patient has no family. [name] is very elderly and cares for her sick husband said is difficult for her to get to the hospital, but she does want to be kept informed. She will come to the hospital if the patient asks for her.  Code Status:  DO NOT RESUSCITATE  Condition:  Guarded  Potential Disposition: To SNF versus Hospice  Time spent in minutes :   Algis Downs,  PA-C on 05/29/2015 at 2:24 PM Between 7am to 7pm - Pager - [phone] After 7pm go to [email or web address] - password [email or web address] And look for the night coverage person covering me after hours  [facility]   I have taken an interval  history, reviewed the chart and examined the patient. I agree with the Advanced Practice Provider's note, impression and recommendations. I have made any necessary editorial changes. 81 yr old female from SNF admitted with CVA, CT showed large stroke, neurology has seen the  patient. No fryer stroke work up, PA discussed with Dr Cyril Mourning. Plan is for comfort.

## 2015-05-29 NOTE — ED Notes (Addendum)
Pt presents to department via [facility] for evaluation of possible stroke and altered mental status. Pt from [facility], staff said she was eating lunch and then slumped over to R in chair. LSN unknown. Upon arrival patient lethargic and unable to follow commands. 20g L forearm. Initial NIHSS of 15.

## 2015-05-29 NOTE — ED Notes (Signed)
CBG 127 

## 2015-05-29 NOTE — Consult Note (Addendum)
Referring Physician: ED    Chief Complaint: code stroke, right face weakness, leaning to the right   HPI:                                                                                                                                         Deborah Gilbert is an 81 y.o. female with a past medical history that is significant for OA, macular degeneration, and hearing loss, brought in via EMS as a code stroke due to acute onset of the aforementioned symptoms. Pt from [facility],and at baseline she is wheelchair bound. According to facility staff, patient was in her wheelchair eating lunch when suddenly slumped over to the right and developed right face weakness. We contacted the staff at that facility and is not entirely clear when patient was last known well, perhaps between 8 and 9 am. Upon arrival to the ED she was able to speak and follow commands. NIHSS 15. CT brain was personally reviewed and showed probable early cytotoxic edema posterior limb internal capsule and insula also on the LEFT ( ASPECTS 8). Further, asymmetric and increased hyperattenuation of the LEFT ICA terminus could represent acute large vessel occlusion. She denies HA, vertigo, double vision, difficulty swallowing, slurred speech, language or vision impairment. Date last known well: 05/29/15 Time last known well: unclear, perhaps 8 or 9 am tPA Given: no, unable to precisely determine last known well. Patient  NIHSS: 16 MRS: 3  Past Medical History  Diagnosis Date  . Painful respiration   . Effusion of lower leg joint   . Dysuria   . Family history of malignant neoplasm of gastrointestinal tract   . Diarrhea   . Painful respiration   . Effusion of lower leg joint   . HOH (hard of hearing)   . Osteoarthritis   . Macular degeneration     Past Surgical History  Procedure Laterality Date  . Hip sx-right  1993  . Child birth    . Catract surgery/bilateral    . Appendectomy      No family history on  file. Social History:  reports that she does not drink alcohol or use illicit drugs. Her tobacco history is not on file. Family history: unable to determine at this moment.  Allergies:  Allergies  Allergen Reactions  . Famotidine     REACTION: "felt like a heart attack"  . Pepcid [Famotidine] Other (See Comments)    Called patient's nursing home and they do not have any reactions known  . Tetracycline     REACTION: Severe nausea and vomiting  . Tetracyclines & Related     Unknown   . Trimethoprim     Unknown     Medications:  I have reviewed the patient's current medications.  ROS:                                                                                                                                      History obtained from staff at nursing home, chart review and the patient  General ROS: negative for - chills, fatigue, fever, night sweats, weight gain or weight loss Psychological ROS: negative for - behavioral disorder, hallucinations, memory difficulties, mood swings or suicidal ideation Ophthalmic ROS: negative for - blurry vision, double vision, eye pain or loss of vision ENT ROS: negative for - epistaxis, nasal discharge, oral lesions, sore throat, tinnitus or vertigo Allergy and Immunology ROS: negative for - hives or itchy/watery eyes Hematological and Lymphatic ROS: negative for - bleeding problems, bruising or swollen lymph nodes Endocrine ROS: negative for - galactorrhea, hair pattern changes, polydipsia/polyuria or temperature intolerance Respiratory ROS: negative for - cough, hemoptysis, shortness of breath or wheezing Cardiovascular ROS: negative for - chest pain, dyspnea on exertion, edema or irregular heartbeat Gastrointestinal ROS: negative for - abdominal pain, diarrhea, hematemesis, nausea/vomiting or stool incontinence Genito-Urinary  ROS: negative for - dysuria, hematuria, incontinence or urinary frequency/urgency Musculoskeletal ROS: negative for - joint swelling Neurological ROS: as noted in HPI Dermatological ROS: negative for rash and skin lesion changes  Physical exam: pleasant female in no apparent distress. Blood pressure 135/66, pulse 81, temperature 97.9 F (36.6 C), temperature source Oral, resp. rate 18, height 5' (1.524 m), weight 20.157 kg (44 lb 7 oz), SpO2 98 %. Head: normocephalic. Neck: supple, no bruits, no JVD. Cardiac: no murmurs. Lungs: clear. Abdomen: soft, no tender, no mass. Extremities: no edema. Skin: no rash  Neurologic Examination:                                                                                                      General: Mental Status: Open eyes to verbal commands, oriented. Comprehension, naming, repetition intact but is not really following commands. No dysarthria. Cranial Nerves: II: Discs unable to assess. Visual fields grossly normal, pupils equal, round, reactive to light and accommodation III,IV, VI: ptosis not present, extra-ocular motions intact bilaterally V,VII: smile asymmetric due to right face weakness, facial light touch sensation normal bilaterally VIII: hearing impaired bilaterally IX,X: uvula rises symmetrically XI: bilateral shoulder shrug no tested XII: midline tongue extension without atrophy or fasciculations Motor: Right sided weakness Sensory: Pinprick and light touch intact throughout, bilaterally Deep Tendon Reflexes:  Trace all over Plantars: Right:  upgoing   Left: downgoing Cerebellar: Unable to test Gait:  Unable to test due to multiple leads. CV: pulses palpable throughout    Results for orders placed or performed during the hospital encounter of 05/29/15 (from the past 48 hour(s))  Ethanol     Status: None   Collection Time: 05/29/15 12:40 PM  Result Value Ref Range   Alcohol, Ethyl (B) <5 <5 mg/dL    Comment:         LOWEST DETECTABLE LIMIT FOR SERUM ALCOHOL IS 5 mg/dL FOR MEDICAL PURPOSES ONLY   Protime-INR     Status: None   Collection Time: 05/29/15 12:40 PM  Result Value Ref Range   Prothrombin Time 14.8 11.6 - 15.2 seconds   INR 1.14 0.00 - 1.49  APTT     Status: None   Collection Time: 05/29/15 12:40 PM  Result Value Ref Range   aPTT 29 24 - 37 seconds  CBC     Status: None   Collection Time: 05/29/15 12:40 PM  Result Value Ref Range   WBC 7.1 4.0 - 10.5 K/uL   RBC 4.13 3.87 - 5.11 MIL/uL   Hemoglobin 12.2 12.0 - 15.0 g/dL   HCT 16.1 09.6 - 04.5 %   MCV 90.3 78.0 - 100.0 fL   MCH 29.5 26.0 - 34.0 pg   MCHC 32.7 30.0 - 36.0 g/dL   RDW 40.9 81.1 - 91.4 %   Platelets 160 150 - 400 K/uL  Differential     Status: Abnormal   Collection Time: 05/29/15 12:40 PM  Result Value Ref Range   Neutrophils Relative % 85 (H) 43 - 77 %   Neutro Abs 6.0 1.7 - 7.7 K/uL   Lymphocytes Relative 8 (L) 12 - 46 %   Lymphs Abs 0.6 (L) 0.7 - 4.0 K/uL   Monocytes Relative 7 3 - 12 %   Monocytes Absolute 0.5 0.1 - 1.0 K/uL   Eosinophils Relative 0 0 - 5 %   Eosinophils Absolute 0.0 0.0 - 0.7 K/uL   Basophils Relative 0 0 - 1 %   Basophils Absolute 0.0 0.0 - 0.1 K/uL  I-stat troponin, ED (not at [facility], [facility])     Status: None   Collection Time: 05/29/15 12:46 PM  Result Value Ref Range   Troponin i, poc 0.00 0.00 - 0.08 ng/mL   Comment 3            Comment: Due to the release kinetics of cTnI, a negative result within the first hours of the onset of symptoms does not rule out myocardial infarction with certainty. If myocardial infarction is still suspected, repeat the test at appropriate intervals.   I-Stat Chem 8, ED  (not at [facility], [facility])     Status: Abnormal   Collection Time: 05/29/15 12:48 PM  Result Value Ref Range   Sodium 141 135 - 145 mmol/L   Potassium 3.6 3.5 - 5.1 mmol/L   Chloride 106 101 - 111 mmol/L   BUN 26 (H) 6 - 20 mg/dL   Creatinine, Ser 7.82 0.44 - 1.00 mg/dL   Glucose, Bld 956  (H) 65 - 99 mg/dL   Calcium, Ion 2.13 (L) 1.13 - 1.30 mmol/L   TCO2 20 0 - 100 mmol/L   Hemoglobin 12.6 12.0 - 15.0 g/dL   HCT 08.6 57.8 - 46.9 %  CBG monitoring, ED     Status: Abnormal   Collection Time: 05/29/15  1:05 PM  Result Value Ref Range   Glucose-Capillary 127 (  H) 65 - 99 mg/dL   Ct Head Wo Contrast  05/29/2015   CLINICAL DATA:  Found slumped over by staff earlier today. Unknown last seen normal time. Altered mental status. Code stroke.  EXAM: CT HEAD WITHOUT CONTRAST  TECHNIQUE: Contiguous axial images were obtained from the base of the skull through the vertex without intravenous contrast.  COMPARISON:  None.  FINDINGS: Asymmetric and increased hyperattenuation of the LEFT ICA terminus could represent acute large vessel occlusion; see image 14 series 3.  Asymmetric hypoattenuation LEFT posterior limb internal capsule, with possible involvement of surrounding deep nuclei and white matter best demonstrated on image 17 series 3 could represent early cytotoxic edema. Possible involvement of the LEFT insula. Correlate clinically for RIGHT-sided weakness.  Global atrophy. Generalized hypoattenuation of white matter representing chronic small vessel disease. No acute hemorrhage, intracranial mass lesion, or extra-axial fluid. Calvarium intact. BILATERAL cataract extraction. Chronic sinus disease. Hypoplastic LEFT mastoid due to asymmetrically enlarged transverse and sigmoid sinus on that side.  IMPRESSION: Possible large vessel occlusion with asymmetric hyperattenuation LEFT ICA terminus.  Suspected early cytotoxic edema posterior limb internal capsule and insula also on the LEFT. No hemorrhage.  ASPECTS score = 8 (insula, internal capsule)  [place] Stroke Program Early CT Score  Normal score = 10  Critical Value/emergent results were called by telephone at the time of interpretation on 05/29/2015 at 12:59 PM to Dr. Leroy Kennedy, who verbally acknowledged these results.   Electronically Signed   By: Elsie Stain M.D.   On: 05/29/2015 13:11    Assessment: 81 y.o. female with acute onset right face-arm-leg weakness. NIHSS 15. CT brain suggestive of early cytotoxic edema posterior limb internal capsule and insula also on the LEFT ( ASPECTS 8). Further, asymmetric and increased hyperattenuation of the LEFT ICA terminus could represent acute large vessel occlusion. Patient was not treated with IV tpa due to uncertainty regarding when she was last seen normal.  NIHSS 15 and  asymmetric and increased hyperattenuation of the LEFT ICA terminus could represent acute large vessel occlusion, but at baseline patient is wheelchair bound and therefore in terms of functional outcome she is not a suitable candidate for endovascular intervention. Admit to medicine. Complete stroke work up. Stroke team will follow up tomorrow.  Stroke Risk Factors - age  Plan: 1. HgbA1c, fasting lipid panel 2. MRI, MRA  of the brain without contrast 3. Echocardiogram 4. Carotid dopplers 5. Prophylactic therapy-aspirin 6. Risk factor modification 7. Telemetry monitoring 8. Frequent neuro checks 9. PT/OT SLP 10. NPO  Wyatt Portela, MD [facility] [phone]  05/29/2015, 1:15 PM

## 2015-05-30 ENCOUNTER — Inpatient Hospital Stay (HOSPITAL_COMMUNITY): Payer: Medicare Other

## 2015-05-30 ENCOUNTER — Ambulatory Visit (HOSPITAL_COMMUNITY): Payer: Medicare Other

## 2015-05-30 DIAGNOSIS — I639 Cerebral infarction, unspecified: Secondary | ICD-10-CM

## 2015-05-30 DIAGNOSIS — I6340 Cerebral infarction due to embolism of unspecified cerebral artery: Secondary | ICD-10-CM

## 2015-05-30 DIAGNOSIS — E860 Dehydration: Secondary | ICD-10-CM

## 2015-05-30 DIAGNOSIS — I480 Paroxysmal atrial fibrillation: Secondary | ICD-10-CM

## 2015-05-30 DIAGNOSIS — R1310 Dysphagia, unspecified: Secondary | ICD-10-CM

## 2015-05-30 DIAGNOSIS — F0390 Unspecified dementia without behavioral disturbance: Secondary | ICD-10-CM

## 2015-05-30 DIAGNOSIS — I63032 Cerebral infarction due to thrombosis of left carotid artery: Secondary | ICD-10-CM

## 2015-05-30 LAB — LIPID PANEL
CHOLESTEROL: 147 mg/dL (ref 0–200)
HDL: 38 mg/dL — ABNORMAL LOW (ref >40)
LDL CALC: 93 mg/dL (ref 0–99)
TRIGLYCERIDES: 79 mg/dL (ref <150)
Total CHOL/HDL Ratio: 3.9 RATIO
VLDL: 16 mg/dL (ref 0–40)

## 2015-05-30 LAB — HEMOGLOBIN A1C
Hgb A1c MFr Bld: 5.6 % (ref 4.8–5.6)
MEAN PLASMA GLUCOSE: 114 mg/dL

## 2015-05-30 MED ORDER — CEFTRIAXONE SODIUM IN DEXTROSE 20 MG/ML IV SOLN
1.0000 g | INTRAVENOUS | Status: DC
  Administered 2015-05-30 – 2015-05-31 (×2): 1 g via INTRAVENOUS
  Filled 2015-05-30 (×2): qty 50

## 2015-05-30 MED ORDER — ATORVASTATIN CALCIUM 10 MG PO TABS
20.0000 mg | ORAL_TABLET | Freq: Every day | ORAL | Status: DC
  Administered 2015-05-30: 20 mg via ORAL
  Filled 2015-05-30: qty 2

## 2015-05-30 NOTE — Progress Notes (Signed)
Utilization Review Completed.Deborah Gilbert T7/26/2016  

## 2015-05-30 NOTE — Evaluation (Signed)
Physical Therapy Evaluation Patient Details Name: Deborah Gilbert MRN: [id number] DOB: 01-09-1920 Today's Date: 05/30/2015   History of Present Illness  81 y.o. female with paroxysmal atrial fibrillation, hearing loss, dementia and chronic kidney disease. Presented to ED with stroke, leaning to R and unable to move R side or speak. CT shows large occlusion in L ICA.  Clinical Impression  Patient presents with problems listed below.  Per chart, patient was dependent with mobility at w/c level, and ADL's including feeding.  Recommend patient return to SNF at discharge, with f/u PT as appropriate in that setting.  No acute PT needs - PT will sign off.    Follow Up Recommendations SNF;Supervision/Assistance - 24 hour    Equipment Recommendations  None recommended by PT    Recommendations for Other Services       Precautions / Restrictions Precautions Precautions: Fall Restrictions Weight Bearing Restrictions: No      Mobility  Bed Mobility Overal bed mobility: Needs Assistance;+2 for physical assistance Bed Mobility: Sidelying to Sit;Sit to Sidelying   Sidelying to sit: Max assist;+2 for physical assistance     Sit to sidelying: Max assist;+2 for physical assistance General bed mobility comments: Patient in sidelying as PT entered room.  Patient lethargic.  Moved to sitting with +2 max assist using bed pads.  Once in sitting, balance fluctuating between requiring min assist and max assist for support.  Patient attempting to use LUE to provide support at times.  After 4 minutes, patient attempting to return to sidelying.  Required +2 assist to return to sidelying and scoot to [facility].    Transfers                 General transfer comment: Not assessed - patient very lethargic this pm.  Ambulation/Gait                Stairs            Wheelchair Mobility    Modified Rankin (Stroke Patients Only) Modified Rankin (Stroke Patients Only) Pre-Morbid Rankin Score:  Severe disability Modified Rankin: Severe disability     Balance Overall balance assessment: Needs assistance Sitting-balance support: Single extremity supported Sitting balance-Leahy Scale: Poor Sitting balance - Comments: Patient unable to maintain balance without UE support and assist. Postural control: Posterior lean                                   Pertinent Vitals/Pain Pain Assessment: No/denies pain (No signs of pain)    Home Living Family/patient expects to be discharged to:: Skilled nursing facility     Type of Home: [place]                Prior Function Level of Independence: Needs assistance   Gait / Transfers Assistance Needed: Patient not ambulatory.  Wheelchair for mobility - staff to propel  ADL's / Homemaking Assistance Needed: Per chart, patient requires assist for feeding and dependent with ADL's  Comments: Pt unable to report history     Hand Dominance   Dominant Hand: Right    Extremity/Trunk Assessment   Upper Extremity Assessment: Defer to OT evaluation           Lower Extremity Assessment: Generalized weakness;Difficult to assess due to impaired cognition      Cervical / Trunk Assessment: Kyphotic  Communication   Communication: HOH (Macular degeneration)  Cognition Arousal/Alertness: Lethargic Behavior During Therapy: Flat  affect Overall Cognitive Status: No family/caregiver present to determine baseline cognitive functioning (h/o dementia)                      General Comments      Exercises        Assessment/Plan    PT Assessment All further PT needs can be met in the next venue of care  PT Diagnosis Difficulty walking;Generalized weakness;Altered mental status   PT Problem List Decreased strength;Decreased activity tolerance;Decreased balance;Decreased mobility;Decreased cognition;Cardiopulmonary status limiting activity  PT Treatment Interventions     PT Goals (Current  goals can be found in the Care Plan section) Acute Rehab PT Goals PT Goal Formulation:  (F/u PT to occur at SNF as appropriate.)    Frequency     Barriers to discharge        Co-evaluation               End of Session Equipment Utilized During Treatment: Oxygen Activity Tolerance: Patient limited by lethargy;Patient limited by fatigue Patient left: in bed;with call bell/phone within reach (Attempted to set bed alarm - not functional. RN notified) Nurse Communication: Mobility status (Bed alarm not functional)         Time: 1400-1409 PT Time Calculation (min) (ACUTE ONLY): 9 min   Charges:   PT Evaluation $Initial PT Evaluation Tier I: 1 Procedure     PT G CodesDespina Pole Jun 23, 2015, 4:58 PM Carita Pian. Sanjuana Kava, [place] Pager [phone]

## 2015-05-30 NOTE — Evaluation (Signed)
Speech Language Pathology Evaluation Patient Details Name: Deborah Gilbert MRN: [id number] DOB: 07-24-1920 Today's Date: 05/30/2015 Time: 1020-1030 SLP Time Calculation (min) (ACUTE ONLY): 10 min  Problem List:  Patient Active Problem List   Diagnosis Date Noted  . CVA (cerebral infarction) 05/29/2015  . Conjunctivitis 05/29/2015  . Rib pain 01/07/2015  . Dehydration, mild 01/07/2015  . Abnormal urinalysis 01/07/2015  . Cellulitis of wrist 10/22/2014  . Cellulitis and abscess of hand 10/22/2014  . UTI (urinary tract infection) 10/22/2014  . Anemia of chronic renal failure, stage 2 (mild) 08/20/2014  . Dysphagia 08/20/2014  . Protein-calorie malnutrition, severe 08/19/2014  . PAF history 08/18/2014  . History of vertigo 08/18/2014  . Dementia without behavioral disturbance 08/18/2014  . CKD (chronic kidney disease) stage 2, GFR 60-89 ml/min 08/18/2014  . Atrial fibrillation 08/18/2014  . UTI (lower urinary tract infection) 02/26/2011  . Hearing loss 03/01/2010  . DEPRESSIVE DISORDER 01/02/2010  . OSTEOARTHRITIS 09/20/2009   Past Medical History:  Past Medical History  Diagnosis Date  . Painful respiration   . Effusion of lower leg joint   . Dysuria   . Family history of malignant neoplasm of gastrointestinal tract   . Diarrhea   . Painful respiration   . Effusion of lower leg joint   . HOH (hard of hearing)   . Osteoarthritis   . Macular degeneration    Past Surgical History:  Past Surgical History  Procedure Laterality Date  . Hip sx-right  1993  . Child birth    . Catract surgery/bilateral    . Appendectomy     HPI:  Deborah Gilbert is a 81 year old female with history of paroxysmal atrial fibrillation, hearing loss, dementia, and chronic kidney disease stage II. She presented the emergency department with question of stroke. Per report she resides at a SNF, is wheelchair-bound and has to have feeding assistance.  For the last 2 weeks her speech has changed and she  has needed more assistance.  Orders received for bedside swallow evaluation and speech-language evaluation.     Assessment / Plan / Recommendation Clinical Impression  Patient presents with severe cognitive deficits; however, suspect this is close to her baseline given report of needing assist with self-feeding prior to admission. Speech-language is intermittently intelligible; however, during the assessment patient was able to clearly express basic needs/wants, " I want to eat." Recommend no skilled SLP services to address cognitive-linguistic status at this time; patient will require 24/7 assist at discharge.     SLP Assessment  Patient does not need any further Speech Lanaguage Pathology Services    Follow Up Recommendations  Skilled Nursing facility;24 hour supervision/assistance            Pertinent Vitals/Pain Pain Assessment: No/denies pain   SLP Goals  Patient/Family Stated Goal: unable to state  SLP Evaluation Prior Functioning  Cognitive/Linguistic Baseline: Information not available (known baseline deficits but not to what level) Type of Home: Skilled Nursing Facility   Cognition  Overall Cognitive Status: No family/caregiver present to determine baseline cognitive functioning Arousal/Alertness: Awake/alert Orientation Level: Disoriented to person;Disoriented to place;Disoriented to time;Disoriented to situation Attention: Sustained Sustained Attention: Impaired Sustained Attention Impairment: Functional basic;Verbal basic Memory: Impaired Awareness: Impaired Problem Solving: Impaired Problem Solving Impairment: Verbal basic;Functional basic Executive Function:  (all impaired due to lower level deficits) Safety/Judgment: Impaired    Comprehension  Auditory Comprehension Overall Auditory Comprehension: Impaired Commands: Impaired One Step Basic Commands: 25-49% accurate (with in the context of a self-care task )  Interfering Components: Attention;Visual  impairments;Hearing;Working memory EffectiveTechniques: Other (Comment) (hand over hand assist) Visual Recognition/Discrimination Discrimination: Exceptions to [facility] Reading Comprehension Reading Status: Unable to assess (comment) (glasses not available )    Expression Expression Primary Mode of Expression: Verbal Verbal Expression Overall Verbal Expression: Appears within functional limits for tasks assessed Other Verbal Expression Comments: functional for very basic wants and needs, " I need to eat" at other times difficult to discern suspect due to cognition   Written Expression Dominant Hand: Right Written Expression: Unable to assess (comment) (severity of deficits)   Oral / Motor Oral Motor/Sensory Function Overall Oral Motor/Sensory Function: Impaired (dufficult to assess, appears weak) Motor Speech Overall Motor Speech: Impaired (intelligible for needs and wants) Effective Techniques: Increased vocal intensity   GO    Deborah Gilbert., CCC-SLP [phone]  Deborah Gilbert 05/30/2015, 11:09 AM

## 2015-05-30 NOTE — Progress Notes (Signed)
PROGRESS NOTE  Deborah Gilbert OZH:[id number] DOB: November 10, 1919 DOA: 05/29/2015 PCP: Evette Georges, MD  Assessment/Plan: Left sided CVA with cytotoxic edema affecting the patient's right side Neurology has consulted and recommends conservative treatment.  -325 mg aspirin. Cancel MRI/MRA as it will not change management- repeat head CT per neuro PT/OT's/speech eval. Patient has a history of dysphagia. She was already unable to feed herself. 2-D echo pending Per close family friend patient is a DO NOT RESUSCITATE.  PT/OT- from [facility]  Paroxysmal atrial fibrillation - full dose aspirin.  -not a candidate for anticoagulation  Chronic kidney disease. Creatinine at baseline.  Previous history of dysphagia DYs diet  Severe hearing loss In order to be heard please speak into the patient's right ear with a raised voice.  Conjunctivitis Will order erythromycin eye ointment and warm compresses.  UTI -rocephin -await culture  Code Status: DNR Family Communication:  Disposition Plan:    Consultants:  neuro  Procedures:      HPI/Subjective: Very hard of hearing  Objective: Filed Vitals:   05/30/15 1108  BP: 124/80  Pulse: 85  Temp: 98.6 F (37 C)  Resp: 18   No intake or output data in the 24 hours ending 05/30/15 1135 Filed Weights   05/29/15 1259  Weight: 20.157 kg (44 lb 7 oz)    Exam:   General:  Awake, NAD  Cardiovascular: irr  Respiratory: clear  Abdomen: +BS, soft  Musculoskeletal: no edema   Data Reviewed: Basic Metabolic Panel:  Recent Labs Lab 05/29/15 1240 05/29/15 1248  NA 140 141  K 3.6 3.6  CL 109 106  CO2 22  --   GLUCOSE 130* 129*  BUN 23* 26*  CREATININE 0.79 0.80  CALCIUM 8.4*  --    Liver Function Tests:  Recent Labs Lab 05/29/15 1240  AST 15  ALT 10*  ALKPHOS 59  BILITOT 0.7  PROT 5.1*  ALBUMIN 2.7*   No results for input(s): LIPASE, AMYLASE in the last 168 hours. No results for input(s):  AMMONIA in the last 168 hours. CBC:  Recent Labs Lab 05/29/15 1240 05/29/15 1248  WBC 7.1  --   NEUTROABS 6.0  --   HGB 12.2 12.6  HCT 37.3 37.0  MCV 90.3  --   PLT 160  --    Cardiac Enzymes: No results for input(s): CKTOTAL, CKMB, CKMBINDEX, TROPONINI in the last 168 hours. BNP (last 3 results)  Recent Labs  01/07/15 1307  BNP 137.9*    ProBNP (last 3 results) No results for input(s): PROBNP in the last 8760 hours.  CBG:  Recent Labs Lab 05/29/15 1305  GLUCAP 127*    No results found for this or any previous visit (from the past 240 hour(s)).   Studies: Dg Chest 2 View  05/29/2015   CLINICAL DATA:  Stroke.  Initial encounter.  EXAM: CHEST  2 VIEW  COMPARISON:  Chest CT 01/07/2015.  FINDINGS: Lungs appear hyperinflated compatible with emphysema. No pneumothorax. Cardiopericardial silhouette is within normal limits for projection. Mediastinal contours are also within normal limits allowing for rotation on this exam. Monitoring leads project over the chest. No airspace disease. No pleural effusion. LEFT costophrenic angle excluded from view due to rotation.  IMPRESSION: Hyperinflation compatible with emphysema or asthma. No acute cardiopulmonary disease.   Electronically Signed   By: Andreas Newport M.D.   On: 05/29/2015 15:54   Ct Head Wo Contrast  05/29/2015   CLINICAL DATA:  Found slumped over by staff earlier today.  Unknown last seen normal time. Altered mental status. Code stroke.  EXAM: CT HEAD WITHOUT CONTRAST  TECHNIQUE: Contiguous axial images were obtained from the base of the skull through the vertex without intravenous contrast.  COMPARISON:  None.  FINDINGS: Asymmetric and increased hyperattenuation of the LEFT ICA terminus could represent acute large vessel occlusion; see image 14 series 3.  Asymmetric hypoattenuation LEFT posterior limb internal capsule, with possible involvement of surrounding deep nuclei and white matter best demonstrated on image 17 series 3  could represent early cytotoxic edema. Possible involvement of the LEFT insula. Correlate clinically for RIGHT-sided weakness.  Global atrophy. Generalized hypoattenuation of white matter representing chronic small vessel disease. No acute hemorrhage, intracranial mass lesion, or extra-axial fluid. Calvarium intact. BILATERAL cataract extraction. Chronic sinus disease. Hypoplastic LEFT mastoid due to asymmetrically enlarged transverse and sigmoid sinus on that side.  IMPRESSION: Possible large vessel occlusion with asymmetric hyperattenuation LEFT ICA terminus.  Suspected early cytotoxic edema posterior limb internal capsule and insula also on the LEFT. No hemorrhage.  ASPECTS score = 8 (insula, internal capsule)  [place] Stroke Program Early CT Score  Normal score = 10  Critical Value/emergent results were called by telephone at the time of interpretation on 05/29/2015 at 12:59 PM to Dr. Leroy Kennedy, who verbally acknowledged these results.   Electronically Signed   By: Elsie Stain M.D.   On: 05/29/2015 13:11    Scheduled Meds: . aspirin EC  81 mg Oral Daily  . aspirin  300 mg Rectal Daily   Or  . aspirin  325 mg Oral Daily  . atorvastatin  20 mg Oral q1800  . erythromycin   Both Eyes 4 times per day  . heparin  5,000 Units Subcutaneous 3 times per day   Continuous Infusions: . sodium chloride 75 mL/hr at 05/30/15 0705   Antibiotics Given (last 72 hours)    None      Principal Problem:   CVA (cerebral infarction) Active Problems:   Hearing loss   PAF history   Dementia without behavioral disturbance   CKD (chronic kidney disease) stage 2, GFR 60-89 ml/min   Dysphagia   Dehydration, mild   Conjunctivitis    Time spent: 25 min    Deborah Gilbert  [facility] Pager [phone]. If 7PM-7AM, please contact night-coverage at [email or web address], password [facility] 05/30/2015, 11:35 AM  LOS: 1 day

## 2015-05-30 NOTE — Progress Notes (Signed)
VASCULAR LAB PRELIMINARY  PRELIMINARY  PRELIMINARY  PRELIMINARY  Carotid duplex completed.    Preliminary report:  Right:  Unable to image due to patient position.  Left:  1-39% ICA stenosis.  Vertebral antegrade.  Theda Payer, RVT 05/30/2015, 2:33 PM

## 2015-05-30 NOTE — Evaluation (Signed)
Occupational Therapy Evaluation Patient Details Name: Deborah Gilbert MRN: [id number] DOB: 1920-01-03 Today's Date: 05/30/2015    History of Present Illness 81 y.o. female with paroxysmal atrial fibrillation, hearing loss, dementia and chronic kidney disease. Presented to ED with stroke, leaning to R and unable to move R side or speak. CT shows large occlusion in L ICA.   Clinical Impression   Pt with dementia and no family/ POA to determine baseline. Per chart, pt is w/c bound at baseline. Pt unable to provide PLOF due to cognition. Pt able to sit EOB using L UE for support; unable to maintain balance during weight shifting. Recommend return to SNF when medically ready.    Follow Up Recommendations  SNF;Supervision/Assistance - 24 hour    Equipment Recommendations  None recommended by OT    Recommendations for Other Services       Precautions / Restrictions Precautions Precautions: Fall Restrictions Weight Bearing Restrictions: No      Mobility Bed Mobility Overal bed mobility: Needs Assistance Bed Mobility: Rolling;Sidelying to Sit Rolling: Max assist Sidelying to sit: Max assist;HOB elevated          Transfers Overall transfer level: Needs assistance   Transfers: Squat Pivot Transfers     Squat pivot transfers: Max assist          Balance Overall balance assessment: Needs assistance Sitting-balance support: Single extremity supported;Feet supported Sitting balance-Leahy Scale: Fair Sitting balance - Comments: Pt able to sit EOB using L UE for support Postural control: Posterior lean;Right lateral lean   Standing balance-Leahy Scale: Zero                              ADL Overall ADL's : Needs assistance/impaired                                       General ADL Comments: Pt requires (A) for all ADLs due to cognition and general weakness.     Vision Vision Assessment?: Vision impaired- to be further tested in  functional context   Perception     Praxis      Pertinent Vitals/Pain Pain Assessment: No/denies pain     Hand Dominance Right   Extremity/Trunk Assessment Upper Extremity Assessment Upper Extremity Assessment: Generalized weakness;Difficult to assess due to impaired cognition   Lower Extremity Assessment Lower Extremity Assessment: Generalized weakness;Difficult to assess due to impaired cognition       Communication Communication Communication: HOH   Cognition Arousal/Alertness: Awake/alert Behavior During Therapy: Flat affect;Anxious Overall Cognitive Status: No family/caregiver present to determine baseline cognitive functioning (dementia)                     General Comments       Exercises       Shoulder Instructions      Home Living Family/patient expects to be discharged to:: Skilled nursing facility                                        Prior Functioning/Environment Level of Independence: Needs assistance  Gait / Transfers Assistance Needed: per chart, pt uses w/c for mobility ADL's / Homemaking Assistance Needed: From SNF, unknown due to no family or POA.   Comments: Pt unable to report history  OT Diagnosis: Generalized weakness;Blindness and low vision;Altered mental status   OT Problem List: Decreased strength;Decreased activity tolerance;Impaired balance (sitting and/or standing);Impaired vision/perception;Decreased cognition;Decreased safety awareness   OT Treatment/Interventions: Self-care/ADL training;Therapeutic exercise;Therapeutic activities;Balance training    OT Goals(Current goals can be found in the care plan section) Acute Rehab OT Goals Patient Stated Goal: none stated OT Goal Formulation: Patient unable to participate in goal setting  OT Frequency: Min 2X/week   Barriers to D/C:            Co-evaluation              End of Session Equipment Utilized During Treatment: Gait belt Nurse  Communication: Mobility status  Activity Tolerance: Patient tolerated treatment well Patient left: in chair;with call bell/phone within reach;with chair alarm set   Time: [phone] OT Time Calculation (min): 24 min Charges:  OT General Charges $OT Visit: 1 Procedure OT Evaluation $Initial OT Evaluation Tier I: 1 Procedure OT Treatments $Self Care/Home Management : 8-22 mins G-Codes:    Marden Noble 05/30/2015, 10:13 AM

## 2015-05-30 NOTE — Evaluation (Signed)
Clinical/Bedside Swallow Evaluation Patient Details  Name: Deborah Gilbert MRN: [id number] Date of Birth: Feb 22, 1920  Today's Date: 05/30/2015 Time: SLP Start Time (ACUTE ONLY): 1030 SLP Stop Time (ACUTE ONLY): 1050 SLP Time Calculation (min) (ACUTE ONLY): 20 min  Past Medical History:  Past Medical History  Diagnosis Date  . Painful respiration   . Effusion of lower leg joint   . Dysuria   . Family history of malignant neoplasm of gastrointestinal tract   . Diarrhea   . Painful respiration   . Effusion of lower leg joint   . HOH (hard of hearing)   . Osteoarthritis   . Macular degeneration    Past Surgical History:  Past Surgical History  Procedure Laterality Date  . Hip sx-right  1993  . Child birth    . Catract surgery/bilateral    . Appendectomy     HPI:  Deborah Gilbert is a 81 year old female with history of paroxysmal atrial fibrillation, hearing loss, dementia, and chronic kidney disease stage II. She presented the emergency department with question of stroke. Per report she resides at a SNF, is wheelchair-bound and has to have feeding assistance.  For the last 2 weeks her speech has changed and she has needed more assistance.  Orders received for bedside swallow evaluation and speech-language evaluation.     Assessment / Plan / Recommendation Clinical Impression  Patient presents with mild right sided oral weakness as evidenced by right sided anterior bolus loss with thin consistencies.  Thin liquids also resulted in immediate reflexive cough response, which was not present with nectar-thick liquids.  Nectar-thick liquids via cup and puree boluses did result in multiple swallows, suggesting possible pharyngeal residue.  However, this appears to clear with time for extra swallows.  Recommend initiation of a Dys.1 texture, nectar-thick liquid diet with full staff assist for use of strict aspiration precautions post over head of bed.   SLP will follow for toleration and  advancement.      Aspiration Risk  Moderate    Diet Recommendation Dysphagia 1 (Puree);Nectar   Medication Administration: Crushed with puree Compensations: Slow rate;Small sips/bites;Multiple dry swallows after each bite/sip    Other  Recommendations Oral Care Recommendations: Oral care BID Other Recommendations: Order thickener from pharmacy;Prohibited food (jello, ice cream, thin soups);Remove water pitcher;Have oral suction available   Follow Up Recommendations  Skilled Nursing facility;24 hour supervision/assistance    Frequency and Duration min 2x/week  1 week   Pertinent Vitals/Pain None    SLP Swallow Goals  See care plan    Swallow Study Prior Functional Status  Cognitive/Linguistic Baseline: Information not available (known baseline deficits but not to what level) Type of Home: Skilled Nursing Facility    General Other Pertinent Information: Deborah Gilbert is a 81 year old female with history of paroxysmal atrial fibrillation, hearing loss, dementia, and chronic kidney disease stage II. She presented the emergency department with question of stroke. Per report she resides at a SNF, is wheelchair-bound and has to have feeding assistance.  For the last 2 weeks her speech has changed and she has needed more assistance.  Orders received for bedside swallow evaluation and speech-language evaluation.   Type of Study: Bedside swallow evaluation Previous Swallow Assessment: BSE 01/2015 recommended Dys.1 and thin  Diet Prior to this Study: NPO Temperature Spikes Noted: No Respiratory Status: Room air History of Recent Intubation: No Behavior/Cognition: Alert;Confused;Requires cueing Oral Cavity - Dentition: Edentulous Self-Feeding Abilities: Needs assist;Needs set up;Other (Comment) (hand over hand assist )  Patient Positioning: Upright in chair/Tumbleform Baseline Vocal Quality: Normal Volitional Cough: Weak Volitional Swallow: Unable to elicit    Oral/Motor/Sensory  Function Overall Oral Motor/Sensory Function:  (difficult to assess, suspect right sided oral weakness due to bolus loss)   Ice Chips Ice chips: Impaired Presentation: Spoon Pharyngeal Phase Impairments: Other (comments) (multiple swallows)   Thin Liquid Thin Liquid: Impaired Presentation: Cup (hand over hand assist ) Oral Phase Impairments: Reduced labial seal Oral Phase Functional Implications: Right anterior spillage Pharyngeal  Phase Impairments: Suspected delayed Swallow;Multiple swallows;Cough - Immediate    Nectar Thick Nectar Thick Liquid: Impaired Presentation: Cup (hand over hand assist) Oral Phase Impairments: Reduced labial seal Pharyngeal Phase Impairments: Suspected delayed Swallow;Multiple swallows   Honey Thick Honey Thick Liquid: Not tested   Puree Puree: Impaired Presentation: Spoon (hand over hand assist) Pharyngeal Phase Impairments: Multiple swallows   Solid   GO    Solid: Not tested      Fae Pippin, M.A., CCC-SLP [phone]  Latrease Kunde 05/30/2015,11:24 AM

## 2015-05-30 NOTE — Progress Notes (Signed)
Initial Nutrition Assessment  DOCUMENTATION CODES:   Underweight  INTERVENTION:   Provide Might[employer] Shakes (Nectar-thick appropriate) TID with meals  NUTRITION DIAGNOSIS:   Predicted suboptimal nutrient intake related to lethargy/confusion, dysphagia as evidenced by meal completion < 25%, severe depletion of muscle mass.   GOAL:   Patient will meet greater than or equal to 90% of their needs   MONITOR:   PO intake, Supplement acceptance, Labs, Weight trends, Skin  REASON FOR ASSESSMENT:   Malnutrition Screening Tool    ASSESSMENT:   81 y.o. female, with paroxysmal atrial fibrillation, hearing loss, dementia, and chronic kidney disease stage II. She presents to the emergency department today with stroke.   Pt asleep at time of visit. Pt with severe muscle wasting in shoulders. Question accuracy of current weight of 44 lbs (?44kg). Per paper chart pt usually receives a [employer] nutritional supplement with each meal daily. Pt's tray was untouched at time of visit.   Labs reviewed.   Diet Order:  DIET - DYS 1 Room service appropriate?: Yes; Fluid consistency:: Nectar Thick  Skin:  Reviewed, no issues  Last BM:  7/26  Height:   Ht Readings from Last 1 Encounters:  05/29/15 5' (1.524 m)    Weight:   Wt Readings from Last 1 Encounters:  05/29/15 44 lb 7 oz (20.157 kg)    Ideal Body Weight:  45.5 kg  Wt Readings from Last 10 Encounters:  05/29/15 44 lb 7 oz (20.157 kg)  01/07/15 90 lb (40.824 kg)  10/22/14 88 lb 14.4 oz (40.325 kg)  08/20/14 94 lb 7.7 oz (42.855 kg)  04/02/14 98 lb (44.453 kg)  02/19/12 98 lb (44.453 kg)  01/22/12 97 lb (43.999 kg)  08/01/11 102 lb (46.267 kg)  03/01/11 108 lb (48.988 kg)  02/26/11 106 lb (48.081 kg)    BMI:  Body mass index is 8.68 kg/(m^2).  Estimated Nutritional Needs:   Kcal:  1200-1400  Protein:  50-60 grams  Fluid:  1.2-1.4 L/day  EDUCATION NEEDS:   No education needs identified at this time  Ian Malkin RD, LDN Inpatient Clinical Dietitian Pager: [phone] After Hours Pager: [phone]

## 2015-05-30 NOTE — Progress Notes (Signed)
STROKE TEAM PROGRESS NOTE   HISTORY Deborah Gilbert is an 81 y.o. female with a past medical history that is significant for OA, macular degeneration, and hearing loss, brought in via EMS as a code stroke due to acute onset of right facial weakness and leaning to the right. Pt from [facility],and at baseline she is wheelchair bound. According to facility staff, patient was in her wheelchair eating lunch when suddenly slumped over to the right and developed right face weakness. We contacted the staff at that facility and is not entirely clear when patient was last known well, perhaps between 8 and 9 am. Upon arrival to the ED she was able to speak and follow commands. NIHSS 15. CT brain was personally reviewed and showed probable early cytotoxic edema posterior limb internal capsule and insula also on the LEFT ( ASPECTS 8). Further, asymmetric and increased hyperattenuation of the LEFT ICA terminus could represent acute large vessel occlusion. She denies HA, vertigo, double vision, difficulty swallowing, slurred speech, language or vision impairment.   Date last known well: 05/29/15 Time last known well: unclear, perhaps 8 or 9 am tPA Given: no, unable to precisely determine last known well. Patient  NIHSS: 16 MRS: 3    SUBJECTIVE No family members present. The patient is extremely hard of hearing. She also appears confused. No meaningful communication was possible. She is unable to follow commands. When passive movement is attempted to patient cries out in pain.    OBJECTIVE Temp:  [97.5 F (36.4 C)-98.6 F (37 C)] 98.6 F (37 C) (07/26 1108) Pulse Rate:  [71-85] 85 (07/26 1108) Cardiac Rhythm:  [-] Normal sinus rhythm (07/26 0206) Resp:  [17-22] 18 (07/26 1108) BP: (119-148)/(47-80) 124/80 mmHg (07/26 1108) SpO2:  [94 %-99 %] 94 % (07/26 0511) Weight:  [20.157 kg (44 lb 7 oz)] 20.157 kg (44 lb 7 oz) (07/25 1259)   Recent Labs Lab 05/29/15 1305  GLUCAP 127*    Recent  Labs Lab 05/29/15 1240 05/29/15 1248  NA 140 141  K 3.6 3.6  CL 109 106  CO2 22  --   GLUCOSE 130* 129*  BUN 23* 26*  CREATININE 0.79 0.80  CALCIUM 8.4*  --     Recent Labs Lab 05/29/15 1240  AST 15  ALT 10*  ALKPHOS 59  BILITOT 0.7  PROT 5.1*  ALBUMIN 2.7*    Recent Labs Lab 05/29/15 1240 05/29/15 1248  WBC 7.1  --   NEUTROABS 6.0  --   HGB 12.2 12.6  HCT 37.3 37.0  MCV 90.3  --   PLT 160  --    No results for input(s): CKTOTAL, CKMB, CKMBINDEX, TROPONINI in the last 168 hours.  Recent Labs  05/29/15 1240  LABPROT 14.8  INR 1.14    Recent Labs  05/29/15 1451  COLORURINE AMBER*  LABSPEC 1.022  PHURINE 5.5  GLUCOSEU NEGATIVE  HGBUR SMALL*  BILIRUBINUR SMALL*  KETONESUR 40*  PROTEINUR NEGATIVE  UROBILINOGEN 1.0  NITRITE NEGATIVE  LEUKOCYTESUR SMALL*       Component Value Date/Time   CHOL 147 05/30/2015 0552   TRIG 79 05/30/2015 0552   HDL 38* 05/30/2015 0552   CHOLHDL 3.9 05/30/2015 0552   VLDL 16 05/30/2015 0552   LDLCALC 93 05/30/2015 0552   Lab Results  Component Value Date   HGBA1C 5.6 05/29/2015      Component Value Date/Time   LABOPIA NONE DETECTED 05/29/2015 1451   COCAINSCRNUR NONE DETECTED 05/29/2015 1451   LABBENZ  NONE DETECTED 05/29/2015 1451   AMPHETMU NONE DETECTED 05/29/2015 1451   THCU NONE DETECTED 05/29/2015 1451   LABBARB NONE DETECTED 05/29/2015 1451     Recent Labs Lab 05/29/15 1240  ETH <5       Imaging  Dg Chest 2 View 05/29/2015    Hyperinflation compatible with emphysema or asthma. No acute cardiopulmonary disease.       Ct Head Wo Contrast 05/29/2015    Possible large vessel occlusion with asymmetric hyperattenuation LEFT ICA terminus.   Suspected early cytotoxic edema posterior limb internal capsule and insula also on the LEFT. No hemorrhage.   ASPECTS score = 8 (insula, internal capsule)  [place] Stroke Program Early CT Score  Normal score = 10      PHYSICAL EXAM Frail cachectic elderly  Caucasian lady sitting up in chair not in distress. . Afebrile. Head is nontraumatic. Neck is supple without bruit.    Cardiac exam no murmur or gallop. Lungs are clear to auscultation. Distal pulses are well felt. Neurological Exam :  Awake alert disoriented 3. Dementia tension, registration and recall. Speech is fluent but at times difficult to understand and tangential. Follows only simple midline and some one-step commands. Extraocular movements are full range. Blinks to threat bilaterally. Pupils small irregular reactive. Fundi and vision acuity cannot be reliably tested. Face is symmetric. Tongue midline. Motor system exam reveals limited right shoulder and hip movements secondary to pain from arthritis. No big drift but suspect mild right-sided weakness patient has poor effort and is not cooperative for detailed muscle testing. Can move right upper and lower extremity against gravity but not to the same degree as the left side. Sensation and coordination cannot be reliably tested. Gait was not tested   ASSESSMENT/PLAN [name] is a 81 y.o. female with history of paroxysmal atrial fibrillation, chronic kidney disease, dementia, OA, macular degeneration, and hearing loss,  presenting with right hemiparesis and right facial droop. She did not receive IV t-PA due to unknown time of onset.  Stroke: Suspect  Dominant  Hemispheric small infarct probably embolic secondary to paroxysmal atrial fibrillation.  Resultant  right hemiparesis  MRI  pending  MRA  pending  Carotid Doppler  pending  2D Echo  pending  LDL 93  HgbA1c 5.6  Subcutaneous heparin and SCDs  for VTE prophylaxis DIET - DYS 1 Room service appropriate?: Yes; Fluid consistency:: Nectar Thick  no antithrombotic prior to admission, now on aspirin 325 mg orally every day  Patient counseled to be compliant with her antithrombotic medications  Ongoing aggressive stroke risk factor management  Therapy  recommendations: Pending  Disposition: Pending  Hypertension  Home meds:  Metoprolol  Stable   Hyperlipidemia  Home meds: No lipid lowering medications prior to admission  LDL 93, goal < 70  Add Lipitor 20 mg daily  Continue statin at discharge     Other Stroke Risk Factors  Advanced age  Paroxysmal atrial fibrillation - the patient is not felt to be an anticoagulation candidate.    Other Active Problems  Dementia  UTI - IV Rocephin   PLAN  If the patient is unable to cooperate with an MRI will perform CT scan 24 hours after the initial scan. Sedation is not recommended for this patient.     Hospital day # 1  Delton See PA-C [facility] Pager [phone] 05/30/2015, 12:49 PM I have personally examined this patient, reviewed notes, independently viewed imaging studies, participated in medical decision making and  plan of care. I have made any additions or clarifications directly to the above note. Agree with note above. Patient presented with right-sided weakness possibly from a small left brain infarct. Etiology likely cardioembolic given history of atrial fibrillation but she is a poor long-term anticoagulation candidate given her frail body habitus, advanced age and dementia She has suspect baseline dementia. She is at risk for neurological worsening, recurrent stroke, TIA needs ongoing stroke evaluation and risk factor control. We'll need to discuss with family as to how aggressive to be with workup and management given her poor neurological baseline.  Delia Heady, MD Medical Director [facility] Pager: [phone] 05/30/2015 1:24 PM    To contact Stroke Continuity provider, please refer to [email or web address]. After hours, contact General Neurology

## 2015-05-31 ENCOUNTER — Inpatient Hospital Stay (HOSPITAL_COMMUNITY): Payer: Medicare Other

## 2015-05-31 DIAGNOSIS — N390 Urinary tract infection, site not specified: Secondary | ICD-10-CM

## 2015-05-31 DIAGNOSIS — H109 Unspecified conjunctivitis: Secondary | ICD-10-CM

## 2015-05-31 DIAGNOSIS — I63032 Cerebral infarction due to thrombosis of left carotid artery: Secondary | ICD-10-CM | POA: Insufficient documentation

## 2015-05-31 DIAGNOSIS — H9190 Unspecified hearing loss, unspecified ear: Secondary | ICD-10-CM

## 2015-05-31 DIAGNOSIS — I6789 Other cerebrovascular disease: Secondary | ICD-10-CM

## 2015-05-31 LAB — HEMOGLOBIN A1C
HEMOGLOBIN A1C: 5.6 % (ref 4.8–5.6)
MEAN PLASMA GLUCOSE: 114 mg/dL

## 2015-05-31 MED ORDER — ERYTHROMYCIN 5 MG/GM OP OINT: TOPICAL_OINTMENT | Freq: Four times a day (QID) | OPHTHALMIC | Status: DC

## 2015-05-31 MED ORDER — CIPROFLOXACIN HCL 500 MG PO TABS: 500.0000 mg | ORAL_TABLET | Freq: Two times a day (BID) | ORAL | Status: DC

## 2015-05-31 MED ORDER — ATORVASTATIN CALCIUM 20 MG PO TABS: 20.0000 mg | ORAL_TABLET | Freq: Every day | ORAL | Status: DC

## 2015-05-31 MED ORDER — ASPIRIN 325 MG PO TABS: 325.0000 mg | ORAL_TABLET | Freq: Every day | ORAL | Status: DC

## 2015-05-31 NOTE — Clinical Social Work Note (Signed)
Clinical Social Worker facilitated patient discharge including contacting patient family and facility to confirm patient discharge plans.  Clinical information faxed to facility and family agreeable with plan.  CSW arranged ambulance transport via [facility] to [facility].  RN to call report prior to discharge.  DC packet prepared and on chart for transport with number for report.   Clinical Social Worker will sign off for now as social work intervention is no longer needed. Please consult [place] again if new need arises.  Derenda Fennel, MSW, LCSWA [phone] 05/31/2015 2:21 PM

## 2015-05-31 NOTE — Discharge Summary (Addendum)
Physician Discharge Summary  Deborah Gilbert RUE:[id number] DOB: [birth year] DOA: 05/29/2015  PCP: Evette Georges, MD  Admit date: 05/29/2015 Discharge date: 05/31/2015  Time spent: 35 minutes  Recommendations for Outpatient Follow-up:  1. Erythromycin ointment for 3 more days in eye 2. cipro PO for 3 more days 3. DNR 4. May need palliative services to follow at SNF  Discharge Diagnoses:  Active Problems:   Hearing loss   PAF history   Dementia without behavioral disturbance   CKD (chronic kidney disease) stage 2, GFR 60-89 ml/min   Dysphagia   Dehydration, mild   Conjunctivitis   Discharge Condition: stable  Diet recommendation: DYS  1 diet nectar thick  Filed Weights   05/29/15 1259  Weight: 20.157 kg (44 lb 7 oz)    History of present illness:  Deborah Gilbert is a 81 y.o. female, with paroxysmal atrial fibrillation, hearing loss, dementia, and chronic kidney disease stage II. She presents to the emergency department today with stroke. The patient is unable to give history secondary to severe hearing loss and dementia. History was gathered from the ER physician and the patient's close family friend [name]. Per [name] the patient has not been doing well for the past 2 weeks. She is wheelchair-bound and has to have feeding assistance. For the last 2 weeks she has sounded "less intelligent" and needed more assistance. Per the skilled nursing facility she was last seen normal at 8 AM this morning. At approximately 1155 and she was found leaning to the right, unable to speak and not moving her right side. In the emergency department a CT scan of her head shows a large vessel occlusion in the left ICA and suspected early cytotoxic edema posterior limb internal capsule and insula on the left.  Per [name] the patient has no family and no POA. Ms. Katrinka Blazing states that she believes the patient would want to be a DO NOT RESUSCITATE.  Hospital Course:  Recrudesnce of  previous stroke symptoms in setting of dementia and acute UTI. No acute stroke. Doubt TIA Neurology has consulted and recommends conservative treatment.  -325 mg aspirin. Cancel MRI/MRA as it will not change management- repeat head CT per neuro- no CVA PT/OT's/speech eval. Patient has a history of dysphagia. She was already unable to feed herself. 2-D echo not to be done as patient did not have a CVA Per close family friend patient is a DO NOT RESUSCITATE.  PT/OT- from [facility]- needs SNF  Paroxysmal atrial fibrillation - full dose aspirin.  -not a candidate for anticoagulation  Previous history of dysphagia DYs diet  Severe hearing loss In order to be heard please speak into the patient's right ear with a raised voice.  Conjunctivitis erythromycin eye ointment and warm compresses.  UTI -culture NGTD- cipro PO x 3 days  Procedures:    Consultations:  neuro  Discharge Exam: Filed Vitals:   05/31/15 0934  BP: 154/75  Pulse: 88  Temp: 98.3 F (36.8 C)  Resp: 20    General: hard of hearing   Discharge Instructions   Discharge Instructions    Discharge instructions    Complete by:  As directed   DYS 1 nectar thick     Increase activity slowly    Complete by:  As directed           Current Discharge Medication List    START taking these medications   Details  aspirin 325 MG tablet Take 1 tablet (325 mg total) by  mouth daily.    atorvastatin (LIPITOR) 20 MG tablet Take 1 tablet (20 mg total) by mouth daily at 6 PM.    ciprofloxacin (CIPRO) 500 MG tablet Take 1 tablet (500 mg total) by mouth 2 (two) times daily.    erythromycin ophthalmic ointment Place into both eyes every 6 (six) hours. Qty: 3.5 g, Refills: 0      CONTINUE these medications which have NOT CHANGED   Details  beta carotene w/minerals (OCUVITE) tablet Take 1 tablet by mouth daily. OCUVITE    Calcium Carbonate-Vitamin D (CALCIUM 600+D) 600-400 MG-UNIT per tablet Take 1 tablet  by mouth daily.    Cranberry 250 MG CAPS Take 500 mg by mouth 2 (two) times daily.    meclizine (ANTIVERT) 12.5 MG tablet Take 12.5 mg by mouth daily.     metoprolol tartrate (LOPRESSOR) 25 MG tablet Take 1 tablet (25 mg total) by mouth 2 (two) times daily. Qty: 60 tablet, Refills: 2    magnesium hydroxide (MILK OF MAGNESIA) 400 MG/5ML suspension Take 15 mLs by mouth daily as needed for mild constipation. If no relief in 8 hours give 6oz of prune juice with milk of mag      STOP taking these medications     levofloxacin (LEVAQUIN) 500 MG tablet        Allergies  Allergen Reactions  . Famotidine     REACTION: "felt like a heart attack"  . Pepcid [Famotidine] Other (See Comments)    Called patient's nursing home and they do not have any reactions known  . Tetracycline     REACTION: Severe nausea and vomiting  . Tetracyclines & Related     Unknown   . Trimethoprim     Unknown    Follow-up Information    Follow up with [name],[name], MD In 1 week.   Specialty:  Family Medicine   Contact information:   [address] Christena Flake [place] [address] [phone]        The results of significant diagnostics from this hospitalization (including imaging, microbiology, ancillary and laboratory) are listed below for reference.    Significant Diagnostic Studies: Dg Chest 2 View  05/29/2015   CLINICAL DATA:  Stroke.  Initial encounter.  EXAM: CHEST  2 VIEW  COMPARISON:  Chest CT 01/07/2015.  FINDINGS: Lungs appear hyperinflated compatible with emphysema. No pneumothorax. Cardiopericardial silhouette is within normal limits for projection. Mediastinal contours are also within normal limits allowing for rotation on this exam. Monitoring leads project over the chest. No airspace disease. No pleural effusion. LEFT costophrenic angle excluded from view due to rotation.  IMPRESSION: Hyperinflation compatible with emphysema or asthma. No acute cardiopulmonary disease.   Electronically  Signed   By: Andreas Newport M.D.   On: 05/29/2015 15:54   Ct Head Wo Contrast  05/30/2015   CLINICAL DATA:  Right-sided weakness. The patient is unable to speak. Possible infarct by prior CT.  EXAM: CT HEAD WITHOUT CONTRAST  TECHNIQUE: Contiguous axial images were obtained from the base of the skull through the vertex without intravenous contrast.  COMPARISON:  Head CT scan 05/29/2015.  FINDINGS: Possible thrombus in left MCA seen on the prior study is not appreciated today. There is no evidence of acute infarction on today's examination. Atrophy and chronic microvascular ischemic change are noted. No mass, mass lesion, midline shift or abnormal extra-axial fluid collection is seen. No hydrocephalus or pneumocephalus. The calvarium is intact. Small left mastoid effusion is noted.  IMPRESSION: Negative for infarct.  No  acute finding.  Atrophy and chronic microvascular ischemic change.   Electronically Signed   By: Drusilla Kanner M.D.   On: 05/30/2015 19:37   Ct Head Wo Contrast  05/29/2015   CLINICAL DATA:  Found slumped over by staff earlier today. Unknown last seen normal time. Altered mental status. Code stroke.  EXAM: CT HEAD WITHOUT CONTRAST  TECHNIQUE: Contiguous axial images were obtained from the base of the skull through the vertex without intravenous contrast.  COMPARISON:  None.  FINDINGS: Asymmetric and increased hyperattenuation of the LEFT ICA terminus could represent acute large vessel occlusion; see image 14 series 3.  Asymmetric hypoattenuation LEFT posterior limb internal capsule, with possible involvement of surrounding deep nuclei and white matter best demonstrated on image 17 series 3 could represent early cytotoxic edema. Possible involvement of the LEFT insula. Correlate clinically for RIGHT-sided weakness.  Global atrophy. Generalized hypoattenuation of white matter representing chronic small vessel disease. No acute hemorrhage, intracranial mass lesion, or extra-axial fluid.  Calvarium intact. BILATERAL cataract extraction. Chronic sinus disease. Hypoplastic LEFT mastoid due to asymmetrically enlarged transverse and sigmoid sinus on that side.  IMPRESSION: Possible large vessel occlusion with asymmetric hyperattenuation LEFT ICA terminus.  Suspected early cytotoxic edema posterior limb internal capsule and insula also on the LEFT. No hemorrhage.  ASPECTS score = 8 (insula, internal capsule)  [place] Stroke Program Early CT Score  Normal score = 10  Critical Value/emergent results were called by telephone at the time of interpretation on 05/29/2015 at 12:59 PM to Dr. Leroy Kennedy, who verbally acknowledged these results.   Electronically Signed   By: Elsie Stain M.D.   On: 05/29/2015 13:11    Microbiology: Recent Results (from the past 240 hour(s))  Urine culture     Status: None (Preliminary result)   Collection Time: 05/29/15  2:51 PM  Result Value Ref Range Status   Specimen Description URINE, CATHETERIZED  Final   Special Requests NONE  Final   Culture CULTURE REINCUBATED FOR BETTER GROWTH  Final   Report Status PENDING  Incomplete     Labs: Basic Metabolic Panel:  Recent Labs Lab 05/29/15 1240 05/29/15 1248  NA 140 141  K 3.6 3.6  CL 109 106  CO2 22  --   GLUCOSE 130* 129*  BUN 23* 26*  CREATININE 0.79 0.80  CALCIUM 8.4*  --    Liver Function Tests:  Recent Labs Lab 05/29/15 1240  AST 15  ALT 10*  ALKPHOS 59  BILITOT 0.7  PROT 5.1*  ALBUMIN 2.7*   No results for input(s): LIPASE, AMYLASE in the last 168 hours. No results for input(s): AMMONIA in the last 168 hours. CBC:  Recent Labs Lab 05/29/15 1240 05/29/15 1248  WBC 7.1  --   NEUTROABS 6.0  --   HGB 12.2 12.6  HCT 37.3 37.0  MCV 90.3  --   PLT 160  --    Cardiac Enzymes: No results for input(s): CKTOTAL, CKMB, CKMBINDEX, TROPONINI in the last 168 hours. BNP: BNP (last 3 results)  Recent Labs  01/07/15 1307  BNP 137.9*    ProBNP (last 3 results) No results for  input(s): PROBNP in the last 8760 hours.  CBG:  Recent Labs Lab 05/29/15 1305  GLUCAP 127*       Signed:  Adrick Kestler  [facility] 05/31/2015, 12:54 PM

## 2015-05-31 NOTE — Progress Notes (Signed)
STROKE TEAM PROGRESS NOTE   SUBJECTIVE No family at the bedside. Pt says "don't move that, it hurts"... She is talking, understanding a few more words.  OBJECTIVE Temp:  [97.5 F (36.4 C)-98.6 F (37 C)] 98.3 F (36.8 C) (07/27 0934) Pulse Rate:  [62-88] 88 (07/27 0934) Cardiac Rhythm:  [-] Normal sinus rhythm (07/26 2009) Resp:  [16-20] 20 (07/27 0934) BP: (107-177)/(60-82) 154/75 mmHg (07/27 0934) SpO2:  [93 %-100 %] 95 % (07/27 0934)   Recent Labs Lab 05/29/15 1305  GLUCAP 127*    Recent Labs Lab 05/29/15 1240 05/29/15 1248  NA 140 141  K 3.6 3.6  CL 109 106  CO2 22  --   GLUCOSE 130* 129*  BUN 23* 26*  CREATININE 0.79 0.80  CALCIUM 8.4*  --     Recent Labs Lab 05/29/15 1240  AST 15  ALT 10*  ALKPHOS 59  BILITOT 0.7  PROT 5.1*  ALBUMIN 2.7*    Recent Labs Lab 05/29/15 1240 05/29/15 1248  WBC 7.1  --   NEUTROABS 6.0  --   HGB 12.2 12.6  HCT 37.3 37.0  MCV 90.3  --   PLT 160  --    No results for input(s): CKTOTAL, CKMB, CKMBINDEX, TROPONINI in the last 168 hours.  Recent Labs  05/29/15 1240  LABPROT 14.8  INR 1.14    Recent Labs  05/29/15 1451  COLORURINE AMBER*  LABSPEC 1.022  PHURINE 5.5  GLUCOSEU NEGATIVE  HGBUR SMALL*  BILIRUBINUR SMALL*  KETONESUR 40*  PROTEINUR NEGATIVE  UROBILINOGEN 1.0  NITRITE NEGATIVE  LEUKOCYTESUR SMALL*       Component Value Date/Time   CHOL 147 05/30/2015 0552   TRIG 79 05/30/2015 0552   HDL 38* 05/30/2015 0552   CHOLHDL 3.9 05/30/2015 0552   VLDL 16 05/30/2015 0552   LDLCALC 93 05/30/2015 0552   Lab Results  Component Value Date   HGBA1C 5.6 05/30/2015      Component Value Date/Time   LABOPIA NONE DETECTED 05/29/2015 1451   COCAINSCRNUR NONE DETECTED 05/29/2015 1451   LABBENZ NONE DETECTED 05/29/2015 1451   AMPHETMU NONE DETECTED 05/29/2015 1451   THCU NONE DETECTED 05/29/2015 1451   LABBARB NONE DETECTED 05/29/2015 1451     Recent Labs Lab 05/29/15 1240  ETH <5    Dg Chest  2 View 05/29/2015    Hyperinflation compatible with emphysema or asthma. No acute cardiopulmonary disease.     Ct Head Wo Contrast 05/29/2015    Possible large vessel occlusion with asymmetric hyperattenuation LEFT ICA terminus.   Suspected early cytotoxic edema posterior limb internal capsule and insula also on the LEFT. No hemorrhage.  ASPECTS score = 8 (insula, internal capsule)  [place] Stroke Program Early CT Score  Normal score = 10    Ct Head Wo Contrast 05/30/2015    Negative for infarct.  No acute finding.  Atrophy and chronic microvascular ischemic change.    Carotid duplex  Right: Unable to image due to patient position. Left: 1-39% ICA stenosis. Vertebral antegrade.   PHYSICAL EXAM Frail cachectic elderly Caucasian lady sitting up in chair not in distress. . Afebrile. Head is nontraumatic. Neck is supple without bruit.    Cardiac exam no murmur or gallop. Lungs are clear to auscultation. Distal pulses are well felt. Neurological Exam :  Awake alert disoriented 3. Diminished attension, registration and recall. Speech is fluent but at times difficult to understand and tangential. Follows only simple midline and some one-step commands. Extraocular movements are  full range. Blinks to threat bilaterally. Pupils small irregular reactive. Fundi and vision acuity cannot be reliably tested. Face is symmetric. Tongue midline. Motor system exam reveals limited right shoulder and hip movements secondary to pain from arthritis. No big drift but suspect mild right-sided weakness patient has poor effort and is not cooperative for detailed muscle testing. Can move right upper and lower extremity against gravity but not to the same degree as the left side. Sensation and coordination cannot be reliably tested. Gait was not tested   ASSESSMENT/PLAN Deborah Gilbert is a 81 y.o. female with history of paroxysmal atrial fibrillation, chronic kidney disease, dementia, OA, macular degeneration, and  hearing loss,  presenting with right hemiparesis and right facial droop. She did not receive IV t-PA due to unknown time of onset.  Recrudesnce of previous stroke symptoms in setting of dementia and acute UTI. No acute stroke. Doubt TIA.  Given risk, recommend complete stroke workup prior to discharge  Resultant  right hemiparesis and facial droop resolved  Repeat CT without acute stroke  Carotid Doppler  No significant stenosis   2D Echo  pending   LDL 93  HgbA1c 5.6  Subcutaneous heparin and SCDs  for VTE prophylaxis DIET - DYS 1 Room service appropriate?: Yes; Fluid consistency:: Nectar Thick  no antithrombotic prior to admission, now on aspirin 325 mg orally every day  Patient counseled to be compliant with her antithrombotic medications  Ongoing aggressive stroke risk factor management  Therapy recommendations: SNF  Disposition: SNF  Paroxysmal atrial fibrillation   Full dose aspirin   Not an anticoagulation candidate  Hypertension  Home meds:  Metoprolol  Stable  Hyperlipidemia  Home meds: No lipid lowering medications prior to admission  LDL 93  Added Lipitor 20 mg daily  Continue statin at discharge  Other Stroke Risk Factors  Advanced age  Other Active Problems  Dementia  UTI - IV Rocephin  PLAN  NOTHING FURTHER TO ADD FROM THE STROKE STANDPOINT  2D echo pending  Ongoing risk factor control by Primary Care Physician  Stroke Service will sign off. Please call should any needs arise.  No neuro Follow-up indicated  Hospital day # 2  [facility]oder[facility] See Amion for Pager information 05/31/2015 11:11 AM  I have personally examined this patient, reviewed notes, independently viewed imaging studies, participated in medical decision making and plan of care. I have made any additions or clarifications directly to the above note. Agree with note above. Patient had transient neurological worsening on a baseline dementia  in the setting of urinary tract infection with 2 CT scan showing no evidence of stroke. MRI cannot be obtained due to patient's lack of cooperation. I do not feel further neurological workup is necessary. Currently call for questions. Stroke team will sign off. Delia Heady, MD Medical Director [facility] St[facility] Pager: [phone] 05/31/2015 12:37 PM    To contact Stroke Continuity provider, please refer to [email or web address]. After hours, contact General Neurology

## 2015-05-31 NOTE — Progress Notes (Signed)
Echocardiogram 2D Echocardiogram has been performed.  Nolon Rod 05/31/2015, 1:40 PM

## 2015-05-31 NOTE — Clinical Social Work Note (Signed)
[facility] Medicare authorization obtained [id number]. Patient has a SNF bed at [facility] and [facility].   CSW remains available as needed.   Derenda Fennel, MSW, LCSWA [phone] 05/31/2015 12:22 PM

## 2015-05-31 NOTE — Clinical Social Work Note (Signed)
Patient has a bed at [facility] and [facility]. CSW currently awaiting insurance approval from [facility].   CSW remains available.   Derenda Fennel, MSW, LCSWA [phone] 05/31/2015 11:03 AM

## 2015-05-31 NOTE — Clinical Social Work Note (Signed)
Clinical Social Worker spoke with patient's friend, [name] in reference to SNF placement. Patient's friend agreeable to short-term SNF placement at [facility]. CSW explained SNF process and [employer] process and coverage. Patient's friend expressed understanding and will await bed offers for SNF placement.   CSW has submitted FL-2 and clinicals to [facility] for SNF authorization.   Derenda Fennel, MSW, LCSWA [phone] 05/31/2015 10:00 AM

## 2015-05-31 NOTE — Clinical Social Work Placement (Signed)
   CLINICAL SOCIAL WORK PLACEMENT  NOTE  Date:  05/31/2015  Patient Details  Name: Deborah Gilbert MRN: [id number] Date of Birth: 08-21-1920  Clinical Social Work is seeking post-discharge placement for this patient at the Skilled  Nursing Facility level of care (*CSW will initial, date and re-position this form in  chart as items are completed):  Yes   Patient/family provided with [facility] Clinical Social Work Department's list of facilities offering this level of care within the geographic area requested by the patient (or if unable, by the patient's family).  Yes   Patient/family informed of their freedom to choose among providers that offer the needed level of care, that participate in Medicare, Medicaid or managed care program needed by the patient, have an available bed and are willing to accept the patient.  Yes   Patient/family informed of [facility]'s ownership interest in [facility] and [facility], as well as of the fact that they are under no obligation to receive care at these facilities.  PASRR submitted to EDS on 05/31/15     PASRR number received on 05/31/15     Existing PASRR number confirmed on       FL2 transmitted to all facilities in geographic area requested by pt/family on 05/31/15     FL2 transmitted to all facilities within larger geographic area on       Patient informed that his/her managed care company has contracts with or will negotiate with certain facilities, including the following:   (YES)     Yes   Patient/family informed of bed offers received.  Patient chooses bed at  [facility])     Physician recommends and patient chooses bed at      Patient to be transferred to  [facility]) on 05/31/15.  Patient to be transferred to facility by  Sharin Mons )     Patient family notified on 05/31/15 of transfer.  Name of family member notified:   (Pt's friend, [name] )     PHYSICIAN Please sign FL2,  Please sign DNR, Please prepare prescriptions, Please prepare priority discharge summary, including medications     Additional Comment:    _______________________________________________ Derenda Fennel, MSW, LCSWA [phone] 05/31/2015 12:19 PM

## 2015-05-31 NOTE — Progress Notes (Signed)
Speech Language Pathology Treatment: Dysphagia  Patient Details Name: Deborah Gilbert MRN: [id number] DOB: 06/06/1920 Today's Date: 05/31/2015 Time: 8295-6213 SLP Time Calculation (min) (ACUTE ONLY): 13 min  Assessment / Plan / Recommendation Clinical Impression  Skilled treatment session focused on addressing skilled observation of advanced textures and liquids.  SLP facilitated session with set-up and Mod multimodal cues for problem solving of self-feeding.  Patient with improvements since yesterday's session but continues to demonstrate subtle s/s of aspiration with thin liquids.  Recommend to continue with nectar-thick liquids at this time.  SLP also offered patient Dys.1 and Dys.2 textures; however, she declined.  Per RN report patient did well with lunch.  As a results, recommend to continue with current diet orders and SLP follow up at next level of care.     HPI Other Pertinent Information: Deborah Gilbert is a 81 year old female with history of paroxysmal atrial fibrillation, hearing loss, dementia, and chronic kidney disease stage II. She presented the emergency department with question of stroke. Per report she resides at a SNF, is wheelchair-bound and has to have feeding assistance.  For the last 2 weeks her speech has changed and she has needed more assistance.  Orders received for bedside swallow evaluation and speech-language evaluation.     Pertinent Vitals Pain Assessment: No/denies pain  SLP Plan  Continue with current plan of care    Recommendations Diet recommendations: Dysphagia 1 (puree);Nectar-thick liquid Liquids provided via: Cup;No straw Medication Administration: Crushed with puree Supervision: Patient able to self feed;Full supervision/cueing for compensatory strategies Compensations: Slow rate;Small sips/bites;Multiple dry swallows after each bite/sip Postural Changes and/or Swallow Maneuvers: Seated upright 90 degrees              Oral Care Recommendations: Oral  care BID Follow up Recommendations: Skilled Nursing facility;24 hour supervision/assistance Plan: Continue with current plan of care    GO    Charlane Ferretti., CCC-SLP [phone]  Nohlan Burdin 05/31/2015, 2:58 PM

## 2015-05-31 NOTE — Care Management Important Message (Signed)
Important Message  Patient Details  Name: Deborah Gilbert MRN: [id number] Date of Birth: November 09, 1919   Medicare Important Message Given:  Yes-second notification given    Orson Aloe 05/31/2015, 12:29 PM

## 2015-05-31 NOTE — Progress Notes (Signed)
Discharge orders received, pt for discharge today to SNF.  IV and telemetry D/C.  D/C instructions and Rx in packet for receiving facility and report called to SNF. . PTAR brought pt downstairs via stretcher.    

## 2015-06-01 LAB — URINE CULTURE: Culture: >=100000

## 2015-07-18 ENCOUNTER — Encounter (HOSPITAL_COMMUNITY): Payer: Self-pay | Admitting: *Deleted

## 2015-07-18 ENCOUNTER — Emergency Department (HOSPITAL_COMMUNITY): Payer: Medicare Other

## 2015-07-18 ENCOUNTER — Inpatient Hospital Stay (HOSPITAL_COMMUNITY)
Admission: EM | Admit: 2015-07-18 | Discharge: 2015-07-25 | DRG: 592 | Disposition: A | Discharge status: Hospice | Payer: Medicare Other | Attending: Internal Medicine | Admitting: Internal Medicine

## 2015-07-18 DIAGNOSIS — Z881 Allergy status to other antibiotic agents status: Secondary | ICD-10-CM

## 2015-07-18 DIAGNOSIS — R627 Adult failure to thrive: Secondary | ICD-10-CM | POA: Diagnosis present

## 2015-07-18 DIAGNOSIS — L89219 Pressure ulcer of right hip, unspecified stage: Secondary | ICD-10-CM | POA: Diagnosis not present

## 2015-07-18 DIAGNOSIS — L89210 Pressure ulcer of right hip, unstageable: Secondary | ICD-10-CM | POA: Diagnosis not present

## 2015-07-18 DIAGNOSIS — Z66 Do not resuscitate: Secondary | ICD-10-CM | POA: Diagnosis present

## 2015-07-18 DIAGNOSIS — Z8673 Personal history of transient ischemic attack (TIA), and cerebral infarction without residual deficits: Secondary | ICD-10-CM | POA: Diagnosis not present

## 2015-07-18 DIAGNOSIS — D631 Anemia in chronic kidney disease: Secondary | ICD-10-CM | POA: Diagnosis present

## 2015-07-18 DIAGNOSIS — M1990 Unspecified osteoarthritis, unspecified site: Secondary | ICD-10-CM | POA: Diagnosis present

## 2015-07-18 DIAGNOSIS — E860 Dehydration: Secondary | ICD-10-CM | POA: Diagnosis present

## 2015-07-18 DIAGNOSIS — Z79899 Other long term (current) drug therapy: Secondary | ICD-10-CM

## 2015-07-18 DIAGNOSIS — Z800 Family history of malignant neoplasm of digestive organs: Secondary | ICD-10-CM | POA: Diagnosis not present

## 2015-07-18 DIAGNOSIS — Z681 Body mass index (BMI) 19 or less, adult: Secondary | ICD-10-CM

## 2015-07-18 DIAGNOSIS — H3530 Unspecified macular degeneration: Secondary | ICD-10-CM | POA: Diagnosis present

## 2015-07-18 DIAGNOSIS — N182 Chronic kidney disease, stage 2 (mild): Secondary | ICD-10-CM | POA: Diagnosis present

## 2015-07-18 DIAGNOSIS — L8990 Pressure ulcer of unspecified site, unspecified stage: Secondary | ICD-10-CM | POA: Diagnosis present

## 2015-07-18 DIAGNOSIS — N179 Acute kidney failure, unspecified: Secondary | ICD-10-CM | POA: Diagnosis present

## 2015-07-18 DIAGNOSIS — R32 Unspecified urinary incontinence: Secondary | ICD-10-CM | POA: Diagnosis present

## 2015-07-18 DIAGNOSIS — E43 Unspecified severe protein-calorie malnutrition: Secondary | ICD-10-CM | POA: Diagnosis present

## 2015-07-18 DIAGNOSIS — H9190 Unspecified hearing loss, unspecified ear: Secondary | ICD-10-CM | POA: Diagnosis present

## 2015-07-18 DIAGNOSIS — N3000 Acute cystitis without hematuria: Secondary | ICD-10-CM

## 2015-07-18 DIAGNOSIS — R0602 Shortness of breath: Secondary | ICD-10-CM

## 2015-07-18 DIAGNOSIS — Z7982 Long term (current) use of aspirin: Secondary | ICD-10-CM | POA: Diagnosis not present

## 2015-07-18 DIAGNOSIS — I480 Paroxysmal atrial fibrillation: Secondary | ICD-10-CM | POA: Diagnosis present

## 2015-07-18 DIAGNOSIS — E871 Hypo-osmolality and hyponatremia: Secondary | ICD-10-CM | POA: Diagnosis present

## 2015-07-18 DIAGNOSIS — F0390 Unspecified dementia without behavioral disturbance: Secondary | ICD-10-CM | POA: Diagnosis present

## 2015-07-18 DIAGNOSIS — Z515 Encounter for palliative care: Secondary | ICD-10-CM | POA: Diagnosis not present

## 2015-07-18 DIAGNOSIS — Z888 Allergy status to other drugs, medicaments and biological substances status: Secondary | ICD-10-CM

## 2015-07-18 DIAGNOSIS — N390 Urinary tract infection, site not specified: Secondary | ICD-10-CM | POA: Diagnosis present

## 2015-07-18 DIAGNOSIS — E870 Hyperosmolality and hypernatremia: Secondary | ICD-10-CM | POA: Diagnosis present

## 2015-07-18 LAB — CBC WITH DIFFERENTIAL/PLATELET
BASOS ABS: 0 10*3/uL (ref 0.0–0.1)
Basophils Relative: 1 % (ref 0–1)
Eosinophils Absolute: 0 10*3/uL (ref 0.0–0.7)
Eosinophils Relative: 0 % (ref 0–5)
HEMATOCRIT: 39.9 % (ref 36.0–46.0)
HEMOGLOBIN: 12.6 g/dL (ref 12.0–15.0)
LYMPHS PCT: 20 % (ref 12–46)
Lymphs Abs: 1.5 10*3/uL (ref 0.7–4.0)
MCH: 28.8 pg (ref 26.0–34.0)
MCHC: 31.6 g/dL (ref 30.0–36.0)
MCV: 91.3 fL (ref 78.0–100.0)
Monocytes Absolute: 0.6 10*3/uL (ref 0.1–1.0)
Monocytes Relative: 8 % (ref 3–12)
NEUTROS ABS: 5.1 10*3/uL (ref 1.7–7.7)
NEUTROS PCT: 71 % (ref 43–77)
Platelets: 198 10*3/uL (ref 150–400)
RBC: 4.37 MIL/uL (ref 3.87–5.11)
RDW: 14.7 % (ref 11.5–15.5)
WBC: 7.2 10*3/uL (ref 4.0–10.5)

## 2015-07-18 LAB — URINALYSIS, ROUTINE W REFLEX MICROSCOPIC
GLUCOSE, UA: NEGATIVE mg/dL
Ketones, ur: 15 mg/dL — AB
Nitrite: POSITIVE — AB
PH: 6.5 (ref 5.0–8.0)
Protein, ur: 30 mg/dL — AB
Specific Gravity, Urine: 1.024 (ref 1.005–1.030)
Urobilinogen, UA: 1 mg/dL (ref 0.0–1.0)

## 2015-07-18 LAB — URINE MICROSCOPIC-ADD ON

## 2015-07-18 LAB — COMPREHENSIVE METABOLIC PANEL
ALBUMIN: 2.8 g/dL — AB (ref 3.5–5.0)
ALK PHOS: 69 U/L (ref 38–126)
ALT: 11 U/L — AB (ref 14–54)
AST: 18 U/L (ref 15–41)
Anion gap: 9 (ref 5–15)
BILIRUBIN TOTAL: 1.2 mg/dL (ref 0.3–1.2)
BUN: 34 mg/dL — AB (ref 6–20)
CO2: 30 mmol/L (ref 22–32)
CREATININE: 0.93 mg/dL (ref 0.44–1.00)
Calcium: 8.9 mg/dL (ref 8.9–10.3)
Chloride: 115 mmol/L — ABNORMAL HIGH (ref 101–111)
GFR calc Af Amer: 60 mL/min — ABNORMAL LOW (ref >60)
GFR calc non Af Amer: 52 mL/min — ABNORMAL LOW (ref >60)
GLUCOSE: 110 mg/dL — AB (ref 65–99)
Potassium: 3.9 mmol/L (ref 3.5–5.1)
Sodium: 154 mmol/L — ABNORMAL HIGH (ref 135–145)
TOTAL PROTEIN: 6.1 g/dL — AB (ref 6.5–8.1)

## 2015-07-18 LAB — I-STAT CG4 LACTIC ACID, ED: Lactic Acid, Venous: 1.67 mmol/L (ref 0.5–2.0)

## 2015-07-18 MED ORDER — ALUM & MAG HYDROXIDE-SIMETH 200-200-20 MG/5ML PO SUSP: 30.0000 mL | Freq: Four times a day (QID) | ORAL | Status: DC | PRN

## 2015-07-18 MED ORDER — ACETAMINOPHEN 325 MG PO TABS: 650.0000 mg | ORAL_TABLET | Freq: Four times a day (QID) | ORAL | Status: DC | PRN

## 2015-07-18 MED ORDER — ACETAMINOPHEN 650 MG RE SUPP: 650.0000 mg | Freq: Four times a day (QID) | RECTAL | Status: DC | PRN

## 2015-07-18 MED ORDER — DEXTROSE 5 % IV SOLN
1.0000 g | INTRAVENOUS | Status: DC
  Administered 2015-07-18 – 2015-07-20 (×3): 1 g via INTRAVENOUS
  Filled 2015-07-18 (×3): qty 10

## 2015-07-18 MED ORDER — ASPIRIN 325 MG PO TABS
325.0000 mg | ORAL_TABLET | Freq: Every day | ORAL | Status: DC
  Filled 2015-07-18 (×3): qty 1

## 2015-07-18 MED ORDER — OXYCODONE HCL 5 MG PO TABS: 5.0000 mg | ORAL_TABLET | ORAL | Status: DC | PRN

## 2015-07-18 MED ORDER — DEXTROSE 5 % IV SOLN
INTRAVENOUS | Status: DC
  Administered 2015-07-19: 75 mL via INTRAVENOUS
  Administered 2015-07-19: 500 mL via INTRAVENOUS
  Administered 2015-07-20: 75 mL via INTRAVENOUS

## 2015-07-18 MED ORDER — ONDANSETRON HCL 4 MG PO TABS: 4.0000 mg | ORAL_TABLET | Freq: Four times a day (QID) | ORAL | Status: DC | PRN

## 2015-07-18 MED ORDER — METOPROLOL TARTRATE 25 MG PO TABS
25.0000 mg | ORAL_TABLET | Freq: Two times a day (BID) | ORAL | Status: DC
  Administered 2015-07-19: 25 mg via ORAL
  Filled 2015-07-18 (×9): qty 1

## 2015-07-18 MED ORDER — CITALOPRAM HYDROBROMIDE 10 MG PO TABS
10.0000 mg | ORAL_TABLET | Freq: Every day | ORAL | Status: DC
  Filled 2015-07-18 (×3): qty 1

## 2015-07-18 MED ORDER — SODIUM CHLORIDE 0.9 % IV SOLN
INTRAVENOUS | Status: DC
  Administered 2015-07-18: 20:00:00 via INTRAVENOUS

## 2015-07-18 MED ORDER — ONDANSETRON HCL 4 MG/2ML IJ SOLN: 4.0000 mg | Freq: Four times a day (QID) | INTRAMUSCULAR | Status: DC | PRN

## 2015-07-18 MED ORDER — ATORVASTATIN CALCIUM 20 MG PO TABS
20.0000 mg | ORAL_TABLET | Freq: Every day | ORAL | Status: DC
  Administered 2015-07-19: 20 mg via ORAL
  Filled 2015-07-18 (×2): qty 1

## 2015-07-18 MED ORDER — CALCIUM CARBONATE-VITAMIN D 500-200 MG-UNIT PO TABS
1.0000 | ORAL_TABLET | Freq: Every day | ORAL | Status: DC
  Filled 2015-07-18 (×3): qty 1

## 2015-07-18 MED ORDER — HYDROMORPHONE HCL 1 MG/ML IJ SOLN
0.5000 mg | INTRAMUSCULAR | Status: DC | PRN
  Administered 2015-07-19 – 2015-07-20 (×5): 0.5 mg via INTRAVENOUS
  Filled 2015-07-18 (×5): qty 1

## 2015-07-18 MED ORDER — ENOXAPARIN SODIUM 30 MG/0.3ML ~~LOC~~ SOLN
30.0000 mg | Freq: Every day | SUBCUTANEOUS | Status: DC
Start: 2015-07-18 — End: 2015-07-19
  Administered 2015-07-19: 30 mg via SUBCUTANEOUS
  Filled 2015-07-18 (×2): qty 0.3

## 2015-07-18 NOTE — ED Provider Notes (Signed)
CSN: [id number]     Arrival date & time 07/18/15  1643 History   First MD Initiated Contact with Patient 07/18/15 1648     Chief Complaint  Patient presents with  . Pressure Ulcer    right hip     (Consider location/radiation/quality/duration/timing/severity/associated sxs/prior Treatment) HPI Comments: Patient here with ulcer 2 right hip times several weeks. History of dementia and no history can be obtained from her. EMS notes the patient's had decreased oral intake. EMS vital signs are reviewed. Patient transported here for further treatment  The history is provided by the EMS personnel. The history is limited by the condition of the patient.    Past Medical History  Diagnosis Date  . Painful respiration   . Effusion of lower leg joint   . Dysuria   . Family history of malignant neoplasm of gastrointestinal tract   . Diarrhea   . Painful respiration   . Effusion of lower leg joint   . HOH (hard of hearing)   . Osteoarthritis   . Macular degeneration    Past Surgical History  Procedure Laterality Date  . Hip sx-right  1993  . Child birth    . Catract surgery/bilateral    . Appendectomy     History reviewed. No pertinent family history. Social History  Substance Use Topics  . Smoking status: Unknown If Ever Smoked  . Smokeless tobacco: None  . Alcohol Use: No   OB History    No data available     Review of Systems  Unable to perform ROS     Allergies  Famotidine; Pepcid; Tetracycline; Tetracyclines & related; and Trimethoprim  Home Medications   Prior to Admission medications   Medication Sig Start Date End Date Taking? Authorizing Provider  aspirin 325 MG tablet Take 1 tablet (325 mg total) by mouth daily. 05/31/15  Yes Joseph Art, DO  atorvastatin (LIPITOR) 20 MG tablet Take 1 tablet (20 mg total) by mouth daily at 6 PM. 05/31/15  Yes Joseph Art, DO  Calcium Carbonate-Vitamin D (CALCIUM 600+D) 600-400 MG-UNIT per tablet Take 1 tablet by mouth  daily.   Yes Historical Provider, MD  citalopram (CELEXA) 10 MG tablet Take 10 mg by mouth daily.   Yes Historical Provider, MD  Cranberry 250 MG CAPS Take 500 mg by mouth 2 (two) times daily.   Yes Historical Provider, MD  magnesium hydroxide (MILK OF MAGNESIA) 400 MG/5ML suspension Take 15 mLs by mouth daily as needed for mild constipation. If no relief in 8 hours give 6oz of prune juice with milk of mag   Yes Historical Provider, MD  meclizine (ANTIVERT) 12.5 MG tablet Take 12.5 mg by mouth daily.    Yes Historical Provider, MD  metoprolol tartrate (LOPRESSOR) 25 MG tablet Take 1 tablet (25 mg total) by mouth 2 (two) times daily. 08/20/14  Yes [name], MD  beta carotene w/minerals (OCUVITE) tablet Take 1 tablet by mouth daily. OCUVITE Patient not taking: Reported on 07/18/2015 01/11/15   Leatha Gilding, MD  ciprofloxacin (CIPRO) 500 MG tablet Take 1 tablet (500 mg total) by mouth 2 (two) times daily. Patient not taking: Reported on 07/18/2015 05/31/15   Joseph Art, DO  erythromycin ophthalmic ointment Place into both eyes every 6 (six) hours. Patient not taking: Reported on 07/18/2015 05/31/15   Joseph Art, DO   There were no vitals taken for this visit. Physical Exam  Constitutional: She appears cachectic.  Non-toxic appearance. No distress.  HENT:  Head: Normocephalic and atraumatic.  Eyes: Conjunctivae, EOM and lids are normal. Pupils are equal, round, and reactive to light.  Neck: Normal range of motion. Neck supple. No tracheal deviation present. No thyroid mass present.  Cardiovascular: Normal rate, regular rhythm and normal heart sounds.  Exam reveals no gallop.   No murmur heard. Pulmonary/Chest: Effort normal and breath sounds normal. No stridor. No respiratory distress. She has no decreased breath sounds. She has no wheezes. She has no rhonchi. She has no rales.  Abdominal: Soft. Normal appearance and bowel sounds are normal. She exhibits no distension. There is no  tenderness. There is no rebound and no CVA tenderness.  Musculoskeletal: Normal range of motion. She exhibits no edema or tenderness.       Legs: Neurological: She is alert. No cranial nerve deficit or sensory deficit. GCS eye subscore is 4. GCS verbal subscore is 5. GCS motor subscore is 5.  Patient withdraws 4 extremities  Skin: Skin is warm and dry. No abrasion and no rash noted.  Nursing note and vitals reviewed.   ED Course  Procedures (including critical care time) Labs Review Labs Reviewed - No data to display  Imaging Review No results found. I have personally reviewed and evaluated these images and lab results as part of my medical decision-making.   EKG Interpretation None      MDM   Final diagnoses:  SOB (shortness of breath)  SOB (shortness of breath)   patient given IV fluids here and started on Rocephin for UTI. Will be admitted for treatment of hyponatremia and dehydration      Lorre Nick, MD 07/18/15 [phone]

## 2015-07-18 NOTE — ED Notes (Addendum)
Patient is from [facility]. Staff reports patient has a pressure ulcer to right hip that has been worsening over the last 2 weeks. Mental status changes reported as well over the same time frame. Last 2 days pt has not been eating. Pt has hx of dementia, she is a&o to self. 130/70, 96,92% ra  cbg 109

## 2015-07-18 NOTE — ED Notes (Signed)
Bed: ZO10 Expected date:  Expected time:  Means of arrival:  Comments: EMS/75F/R hip pain

## 2015-07-18 NOTE — H&P (Signed)
[facility] Admission History and Physical       AYARI LIWANAG VWU:[id number] DOB: 23-Dec-1919 DOA: 07/18/2015  Referring physician: EDP PCP: Evette Georges, MD  Specialists:   Chief Complaint: Pressure Wound  HPI: Deborah Gilbert is a 81 y.o. female from the [facility] who was sent to the ED due for evaluation of a right hip pressure ulcer.   Per facility report patient has had decreased intake of foods and liquids for the past 3-4 days.    In the ED, she was found to have a sodium level of 154 and an elevated BUN/Cr of 34/0.93.  She was started on IVFs and referred for admission.     Review of Systems: Unable to Obtain from the Patient  Past Medical History  Diagnosis Date  . Painful respiration   . Effusion of lower leg joint   . Dysuria   . Family history of malignant neoplasm of gastrointestinal tract   . Diarrhea   . Painful respiration   . Effusion of lower leg joint   . HOH (hard of hearing)   . Osteoarthritis   . Macular degeneration      Past Surgical History  Procedure Laterality Date  . Hip sx-right  1993  . Child birth    . Catract surgery/bilateral    . Appendectomy        Prior to Admission medications   Medication Sig Start Date End Date Taking? Authorizing Provider  aspirin 325 MG tablet Take 1 tablet (325 mg total) by mouth daily. 05/31/15  Yes Joseph Art, DO  atorvastatin (LIPITOR) 20 MG tablet Take 1 tablet (20 mg total) by mouth daily at 6 PM. 05/31/15  Yes Joseph Art, DO  Calcium Carbonate-Vitamin D (CALCIUM 600+D) 600-400 MG-UNIT per tablet Take 1 tablet by mouth daily.   Yes Historical Provider, MD  citalopram (CELEXA) 10 MG tablet Take 10 mg by mouth daily.   Yes Historical Provider, MD  Cranberry 250 MG CAPS Take 500 mg by mouth 2 (two) times daily.   Yes Historical Provider, MD  magnesium hydroxide (MILK OF MAGNESIA) 400 MG/5ML suspension Take 15 mLs by mouth daily as needed for mild constipation. If no relief in 8 hours  give 6oz of prune juice with milk of mag   Yes Historical Provider, MD  meclizine (ANTIVERT) 12.5 MG tablet Take 12.5 mg by mouth daily.    Yes Historical Provider, MD  metoprolol tartrate (LOPRESSOR) 25 MG tablet Take 1 tablet (25 mg total) by mouth 2 (two) times daily. 08/20/14  Yes [name], MD  beta carotene w/minerals (OCUVITE) tablet Take 1 tablet by mouth daily. OCUVITE Patient not taking: Reported on 07/18/2015 01/11/15   Leatha Gilding, MD  ciprofloxacin (CIPRO) 500 MG tablet Take 1 tablet (500 mg total) by mouth 2 (two) times daily. Patient not taking: Reported on 07/18/2015 05/31/15   Joseph Art, DO  erythromycin ophthalmic ointment Place into both eyes every 6 (six) hours. Patient not taking: Reported on 07/18/2015 05/31/15   Joseph Art, DO     Allergies  Allergen Reactions  . Famotidine     REACTION: "felt like a heart attack"  . Pepcid [Famotidine] Other (See Comments)    Called patient's nursing home and they do not have any reactions known  . Tetracycline     REACTION: Severe nausea and vomiting  . Tetracyclines & Related     Per MAR  . Trimethoprim  Per [employer]    Social History:  reports that she does not drink alcohol or use illicit drugs. Her tobacco history is not on file.    History reviewed. No pertinent family history.     Physical Exam:  GEN:  Pleasant Thin Elderly and Confused 81 y.o. Causasian female examined and in no acute distress; Filed Vitals:   07/18/15 2020 07/18/15 2100 07/18/15 2211 07/18/15 2231  BP: 130/61 115/51 108/51 135/68  Pulse: 88 89 81 78  Temp: 97.6 F (36.4 C)   97.8 F (36.6 C)  TempSrc: Oral   Oral  Resp: 18 18 20 18   Height:    5\' 1"  (1.549 m)  Weight:    20.5 kg (45 lb 3.1 oz)  SpO2: 100% 99% 100% 100%   Blood pressure 135/68, pulse 78, temperature 97.8 F (36.6 C), temperature source Oral, resp. rate 18, height 5\' 1"  (1.549 m), weight 20.5 kg (45 lb 3.1 oz), SpO2 100 %. PSYCH: She is alert and oriented x  1; does not appear anxious does not appear depressed; affect is normal HEENT: Normocephalic and Atraumatic, Mucous membranes pink; PERRLA; EOM intact; Fundi:  Benign;  No scleral icterus, Nares: Patent, Oropharynx: Clear, Edentulous     Neck:  FROM, No Cervical Lymphadenopathy nor Thyromegaly or Carotid Bruit; No JVD; Breasts:: Not examined CHEST WALL: No tenderness CHEST: Normal respiration, clear to auscultation bilaterally HEART: Regular rate and rhythm; no murmurs rubs or gallops BACK: No kyphosis or scoliosis; No CVA tenderness ABDOMEN: Positive Bowel Sounds, Scaphoid, Soft Non-Tender, No Rebound or Guarding; No Masses, No Organomegaly Rectal Exam: Not done EXTREMITIES: No Cyanosis, Clubbing, or Edema; No Ulcerations. Genitalia: not examined PULSES: 2+ and symmetric SKIN: Normal hydration no rash or ulceration CNS:  Alert and Oriented x 1, No Focal Deficits Vascular: pulses palpable throughout    Labs on Admission:  Basic Metabolic Panel:  Recent Labs Lab 07/18/15 1743  NA 154*  K 3.9  CL 115*  CO2 30  GLUCOSE 110*  BUN 34*  CREATININE 0.93  CALCIUM 8.9   Liver Function Tests:  Recent Labs Lab 07/18/15 1743  AST 18  ALT 11*  ALKPHOS 69  BILITOT 1.2  PROT 6.1*  ALBUMIN 2.8*   No results for input(s): LIPASE, AMYLASE in the last 168 hours. No results for input(s): AMMONIA in the last 168 hours. CBC:  Recent Labs Lab 07/18/15 1743  WBC 7.2  NEUTROABS 5.1  HGB 12.6  HCT 39.9  MCV 91.3  PLT 198   Cardiac Enzymes: No results for input(s): CKTOTAL, CKMB, CKMBINDEX, TROPONINI in the last 168 hours.  BNP (last 3 results)  Recent Labs  01/07/15 1307  BNP 137.9*    ProBNP (last 3 results) No results for input(s): PROBNP in the last 8760 hours.  CBG: No results for input(s): GLUCAP in the last 168 hours.  Radiological Exams on Admission: Dg Chest 1 View  07/18/2015   CLINICAL DATA:  Pressure ulcer RIGHT hip worsening over 2 weeks, mental status  changes over same time frame, not eating, dementia  EXAM: CHEST  1 VIEW  COMPARISON:  05/29/2015  FINDINGS: Rotated to the LEFT.  Normal heart size, mediastinal contours and pulmonary vascularity.  Emphysematous changes with biapical scarring.  No gross acute infiltrate, pleural effusion or pneumothorax.  Significant osseous demineralization.  IMPRESSION: COPD changes with biapical scarring.  No acute abnormalities.   Electronically Signed   By: Ulyses Southward M.D.   On: 07/18/2015 17:48   Dg Hip Unilat  With Pelvis 1v Right  07/18/2015   CLINICAL DATA:  Pressure ulcer RIGHT hip worsening over 2 weeks, mental status changes for 2 weeks  EXAM: DG HIP (WITH OR WITHOUT PELVIS) 1V RIGHT  COMPARISON:  None  FINDINGS: Marked osseous demineralization.  RIGHT hip prosthesis.  Old healed fracture inferior RIGHT pubic ramus.  No acute fracture, dislocation or bone destruction.  Lateral margins of the iliac crest superiorly are overpenetrated and inadequately assessed.  No periprosthetic lucency or gross evidence of osteomyelitis.  IMPRESSION: Osseous demineralization with RIGHT hip prosthesis.  No acute abnormalities.   Electronically Signed   By: Ulyses Southward M.D.   On: 07/18/2015 17:50      Assessment/Plan:   81 y.o. female with  Principal Problem:   1.     Hypernatremia- due to poor PO Intake   IVFs with D5W   Monitor Na+levels     Active Problems:   2.     Dehydration   IVFs   Monitor BUN/Cr    3.     Failure to thrive in adult    Nutrition consult in AM    4.     UTI (urinary tract infection)   Urine C+S sent   IV Rocephin     5.     PAF history   On Lopressor    Monitor        6.     CKD (chronic kidney disease) stage 2, GFR 60-89 ml/min   Monitor BUN/Cr    7.     Anemia of chronic renal failure, stage 2 (mild)     8.     History of CVA (cerebrovascular accident)      Hx     9.     Dementia without behavioral disturbance    chorninc    10.     DVT Prophylaxis   Lovenox            Code Status:     DO NOT RESUSCITATE (DNR)      Family Communication:   No Family Present    Disposition Plan:    Inpatient Status    Expected to stay 2-3 days and then return to her SNF  Time spent:  60 Minutes      Deborah Gilbert [facility] Pager [phone]   If 7AM -7PM Please Contact the Day Rounding Team MD for [facility]  If 7PM-7AM, Please Contact Night-Floor Coverage  [email or web address] Password [facility] 07/18/2015, 10:47 PM     ADDENDUM:   Patient was seen and examined on 07/18/2015

## 2015-07-18 NOTE — Progress Notes (Signed)
CSW attempted to meet with patient at bedside. However, she was not effectively communicative. Patient has a history of dementia. CSW was not able to get any information from patient.  There was no family present. Per note, patient presents to [facility] due to pressure ulcer on R hip.  Trish Mage [phone] ED CSW 07/18/2015 6:21 PM

## 2015-07-19 DIAGNOSIS — Z8673 Personal history of transient ischemic attack (TIA), and cerebral infarction without residual deficits: Secondary | ICD-10-CM

## 2015-07-19 DIAGNOSIS — L8990 Pressure ulcer of unspecified site, unspecified stage: Secondary | ICD-10-CM | POA: Diagnosis present

## 2015-07-19 DIAGNOSIS — N182 Chronic kidney disease, stage 2 (mild): Secondary | ICD-10-CM

## 2015-07-19 DIAGNOSIS — N390 Urinary tract infection, site not specified: Secondary | ICD-10-CM

## 2015-07-19 DIAGNOSIS — I480 Paroxysmal atrial fibrillation: Secondary | ICD-10-CM

## 2015-07-19 DIAGNOSIS — R627 Adult failure to thrive: Secondary | ICD-10-CM

## 2015-07-19 DIAGNOSIS — D631 Anemia in chronic kidney disease: Secondary | ICD-10-CM

## 2015-07-19 DIAGNOSIS — F0390 Unspecified dementia without behavioral disturbance: Secondary | ICD-10-CM

## 2015-07-19 DIAGNOSIS — E870 Hyperosmolality and hypernatremia: Secondary | ICD-10-CM

## 2015-07-19 LAB — CBC
HEMATOCRIT: 37.5 % (ref 36.0–46.0)
HEMOGLOBIN: 11.8 g/dL — AB (ref 12.0–15.0)
MCH: 28.9 pg (ref 26.0–34.0)
MCHC: 31.5 g/dL (ref 30.0–36.0)
MCV: 91.9 fL (ref 78.0–100.0)
Platelets: 136 10*3/uL — ABNORMAL LOW (ref 150–400)
RBC: 4.08 MIL/uL (ref 3.87–5.11)
RDW: 14.9 % (ref 11.5–15.5)
WBC: 5.3 10*3/uL (ref 4.0–10.5)

## 2015-07-19 LAB — BASIC METABOLIC PANEL
ANION GAP: 7 (ref 5–15)
BUN: 31 mg/dL — ABNORMAL HIGH (ref 6–20)
CHLORIDE: 117 mmol/L — AB (ref 101–111)
CO2: 28 mmol/L (ref 22–32)
Calcium: 8.3 mg/dL — ABNORMAL LOW (ref 8.9–10.3)
Creatinine, Ser: 0.66 mg/dL (ref 0.44–1.00)
Glucose, Bld: 109 mg/dL — ABNORMAL HIGH (ref 65–99)
POTASSIUM: 3.9 mmol/L (ref 3.5–5.1)
SODIUM: 152 mmol/L — AB (ref 135–145)

## 2015-07-19 MED ORDER — RESOURCE THICKENUP CLEAR PO POWD
ORAL | Status: DC | PRN
  Filled 2015-07-19: qty 125

## 2015-07-19 MED ORDER — SODIUM CHLORIDE 0.45 % IV SOLN: INTRAVENOUS | Status: DC

## 2015-07-19 MED ORDER — COLLAGENASE 250 UNIT/GM EX OINT
TOPICAL_OINTMENT | Freq: Every day | CUTANEOUS | Status: DC
  Administered 2015-07-19 – 2015-07-21 (×3): via TOPICAL
  Filled 2015-07-19: qty 30

## 2015-07-19 MED ORDER — HEPARIN SODIUM (PORCINE) 5000 UNIT/ML IJ SOLN
2500.0000 [IU] | Freq: Two times a day (BID) | INTRAMUSCULAR | Status: DC
  Administered 2015-07-20 – 2015-07-22 (×6): 2500 [IU] via SUBCUTANEOUS
  Filled 2015-07-19 (×10): qty 0.5

## 2015-07-19 NOTE — Evaluation (Signed)
SLP Cancellation Note  Patient Details Name: GEORGENA WEISHEIT MRN: [id number] DOB: 28-Jan-1920   Cancelled treatment:       Reason Eval/Treat Not Completed: Fatigue/lethargy limiting ability to participate (pt recently had been given Dilaudid per RN, currently lethargic unable to have eval)  Pt looks frail and underweight.    Donavan Burnet, MS [facility] SLP [phone]

## 2015-07-19 NOTE — Progress Notes (Signed)
BSE completed, full report to follow. Rec clears, honey thick via tsp only.  Suspect aspiration of nectar via tsp, cup due to poor oral coordination.  Pharyngeal swallow suspected to be intact.  Will follow. Thanks. Donavan Burnet, MS [facility] SLP [phone]

## 2015-07-19 NOTE — Progress Notes (Addendum)
Patient having difficulty swallowing. Coughing with clear liquid breakfast noted. Dr. David Stall paged and notified. Will hold medication until discussed with physcian.

## 2015-07-19 NOTE — Progress Notes (Signed)
Patient dressing to right hip changed by nursing student. Tolerated well. Patient moved to low airloss mattress and prevalon boots applied.

## 2015-07-19 NOTE — Clinical Social Work Note (Signed)
Clinical Social Work Assessment  Patient Details  Name: Deborah Gilbert MRN: [id number] Date of Birth: 1920/07/27  Date of referral:  07/19/15               Reason for consult:  Discharge Planning                Permission sought to share information with:  Other (Friend: [name]) Permission granted to share information::  No  Name::     [name]  Agency::  [facility]   Relationship::  Friend  Contact Information:  [phone]  Housing/Transportation Living arrangements for the past 2 months:  Assisted Living Facility Source of Information:  Facility, Friend/Neighbor [name]) Patient Interpreter Needed:  None Criminal Activity/Legal Involvement Pertinent to Current Situation/Hospitalization:  No - Comment as needed Significant Relationships:  Friend Lives with:  Facility Resident Do you feel safe going back to the place where you live?  Yes Need for family participation in patient care:  Yes (Comment)  Care giving concerns:  CSW spoke with [name] via telephone regarding patient returning to [facility] once medically stable and ready for discharge. [name] reports no concerns regarding patient returning to facility.    Social Worker assessment / plan: Patient is from [facility]. CSW contacted facility regarding patient's return once medically stable and ready for discharge. Facility is more than willing to take the patient back once medically stable. CSW made contact with patient's friend [name] who also voices that she would like the patient to return back to [facility], stating that the facility does a good job with the patient. CSW to complete FL2.   Employment status:  Disabled (Comment on whether or not currently receiving Disability) Insurance information:  Medicare PT Recommendations:  Not assessed at this time Information / Referral to community resources:  Other (Comment Required) (From [facility])  Patient/Family's Response to care:  [name] is agreeable to plan for patient once medically stable and ready for discharge.   Patient/Family's Understanding of and Emotional Response to Diagnosis, Current Treatment, and Prognosis:  Friend is understanding of diagnosis and current treatment.   Emotional Assessment Appearance:  Appears stated age Attitude/Demeanor/Rapport:  Crying Affect (typically observed):  Unable to Assess Orientation:  Oriented to Self Alcohol / Substance use:  Not Applicable Psych involvement (Current and /or in the community):  No (Comment)  Discharge Needs  Concerns to be addressed:  Lack of Support, Home Safety Concerns Readmission within the last 30 days:  No Current discharge risk:  None Barriers to Discharge:  Barriers Resolved   Loleta Dicker, LCSW 07/19/2015, 4:26 PM

## 2015-07-19 NOTE — Progress Notes (Signed)
[facility] PROGRESS NOTE  Assessment/Plan: Hypernatremia - Decrease oral intake she was to D5 W with improvement in her sodium. We'll continue this IV fluid continue to monitor sodium. Will ask PMT to meet with family for goals of care.  Pressure ulcer: - Wound care consult recommended enzymatic debridement  Failure to thrive in adult: History of CVA (cerebrovascular accident) UTI (urinary tract infection) Urine cultures and sensitivities pending, she was started empirically on IV Rocephin.  Anemia of chronic renal failure, stage 2 (mild) Hemoglobin continues to be still expect a drop in her hemoglobin as she is volume resuscitated.  PAF history Lopressor control.    CKD (chronic kidney disease) stage 2, GFR 60-89 ml/min Creatinine improving as well as BUN/creatinine with IV hydration.  Dementia without behavioral disturbance Use haldol when necessary for agitation    Code Status: DNR Family Communication: none  Disposition Plan: inpatient for 2-4 days   Consultants:  PMT  Procedures:  CXR  Antibiotics:  Rocephin  HPI/Subjective: Non verbal  Objective: Filed Vitals:   07/18/15 2211 07/18/15 2231 07/19/15 0139 07/19/15 0630  BP: 108/51 135/68 113/42 129/56  Pulse: 81 78 71 70  Temp:  97.8 F (36.6 C) 97.8 F (36.6 C) 97.7 F (36.5 C)  TempSrc:  Oral Oral Oral  Resp: Height:   (1.549 m)    Weight:  20.5 kg (45 lb 3.1 oz)    SpO2: 100% 100% 99% 97%    Intake/Output Summary (Last 24 hours) at 07/19/15 1300 Last data filed at 07/19/15 1000  Gross per 24 hour  Intake    120 ml  Output      0 ml  Net    120 ml   Filed Weights   07/18/15 2231  Weight: 20.5 kg (45 lb 3.1 oz)    Exam:  General:  in no acute distress.  HEENT: No bruits, no goiter.  Heart: Regular rate and rhythm.  Lungs: Good air movement, clear Abdomen: Soft, nontender, nondistended, positive bowel sounds.  Neuro: Grossly intact,  nonfocal.   Data Reviewed: Basic Metabolic Panel:  Recent Labs Lab 07/18/15 1743 07/19/15 0544  NA 154* 152*  K 3.9 3.9  CL 115* 117*  CO2 30 28  GLUCOSE 110* 109*  BUN 34* 31*  CREATININE 0.93 0.66  CALCIUM 8.9 8.3*   Liver Function Tests:  Recent Labs Lab 07/18/15 1743  AST 18  ALT 11*  ALKPHOS 69  BILITOT 1.2  PROT 6.1*  ALBUMIN 2.8*   No results for input(s): LIPASE, AMYLASE in the last 168 hours. No results for input(s): AMMONIA in the last 168 hours. CBC:  Recent Labs Lab 07/18/15 1743 07/19/15 0544  WBC 7.2 5.3  NEUTROABS 5.1  --   HGB 12.6 11.8*  HCT 39.9 37.5  MCV 91.3 91.9  PLT 198 136*   Cardiac Enzymes: No results for input(s): CKTOTAL, CKMB, CKMBINDEX, TROPONINI in the last 168 hours. BNP (last 3 results)  Recent Labs  01/07/15 1307  BNP 137.9*    ProBNP (last 3 results) No results for input(s): PROBNP in the last 8760 hours.  CBG: No results for input(s): GLUCAP in the last 168 hours.  Recent Results (from the past 240 hour(s))  Urine culture     Status: None (Preliminary result)   Collection Time: 07/18/15  6:06 PM  Result Value Ref Range Status   Specimen Description URINE, CLEAN CATCH  Final   Special Requests NONE  Final  Culture   Final    NO GROWTH < 12 HOURS Performed at [facility]    Report Status PENDING  Incomplete     Studies: Dg Chest 1 View  07/18/2015   CLINICAL DATA:  Pressure ulcer RIGHT hip worsening over 2 weeks, mental status changes over same time frame, not eating, dementia  EXAM: CHEST  1 VIEW  COMPARISON:  05/29/2015  FINDINGS: Rotated to the LEFT.  Normal heart size, mediastinal contours and pulmonary vascularity.  Emphysematous changes with biapical scarring.  No gross acute infiltrate, pleural effusion or pneumothorax.  Significant osseous demineralization.  IMPRESSION: COPD changes with biapical scarring.  No acute abnormalities.   Electronically Signed   By: Ulyses Southward M.D.   On:  07/18/2015 17:48   Dg Hip Unilat With Pelvis 1v Right  07/18/2015   CLINICAL DATA:  Pressure ulcer RIGHT hip worsening over 2 weeks, mental status changes for 2 weeks  EXAM: DG HIP (WITH OR WITHOUT PELVIS) 1V RIGHT  COMPARISON:  None  FINDINGS: Marked osseous demineralization.  RIGHT hip prosthesis.  Old healed fracture inferior RIGHT pubic ramus.  No acute fracture, dislocation or bone destruction.  Lateral margins of the iliac crest superiorly are overpenetrated and inadequately assessed.  No periprosthetic lucency or gross evidence of osteomyelitis.  IMPRESSION: Osseous demineralization with RIGHT hip prosthesis.  No acute abnormalities.   Electronically Signed   By: Ulyses Southward M.D.   On: 07/18/2015 17:50    Scheduled Meds: . aspirin  325 mg Oral Daily  . atorvastatin  20 mg Oral q1800  . calcium-vitamin D  1 tablet Oral Daily  . cefTRIAXone (ROCEPHIN)  IV  1 g Intravenous Q24H  . citalopram  10 mg Oral Daily  . collagenase   Topical Daily  . heparin subcutaneous  2,500 Units Subcutaneous Q12H  . metoprolol tartrate  25 mg Oral BID   Continuous Infusions: . sodium chloride 150 mL/hr at 07/18/15 2016  . dextrose 75 mL (07/19/15 0057)    Time Spent: 25 min   Marinda Elk  [facility] Pager [phone].  If 7PM-7AM, please contact night-coverage at [email or web address], password [facility] 07/19/2015, 1:00 PM  LOS: 1 day

## 2015-07-19 NOTE — Consult Note (Signed)
WOC wound consult note Reason for Consult:Right hip pressure injury (Unstageable). Patient with urinary incontinence.  Bilateral heels are intact. Wound type:pressure Pressure Ulcer POA: Yes Measurement: Black stable eschar on the right hip measure 4cm x 2.5cm.  This is located within an 8.5cm x 6.5cm area of erythema.  No fluctuance, no induration noted, although there is some warmth to the surrounding tissue. Wound bed:As described above Drainage (amount, consistency, odor) None Periwound:As described above Dressing procedure/placement/frequency: I will today implement an enzymatic debriding agent, Collagenase/Santyl to begin the softening process for the necrotic eschar.  When goals of care are determined, it can be decided if a surgical consult for surgical debridement of the eschar is desired or if comfort care and conservative treatment of the pressure injury is desired (painting twice daily with a betadine swabstick and covering with a dry dressing). If you agree, please order either or continue with enzymatic debriding agent.  Due to urinary incontinence, malnourishment and a full thickness pressure injury, I will add a therapeutic mattress with low air loss feature to assist with management of the microclimate. I will also provide bilateral pressure redistribution boots for the heels. WOC nursing team will not follow, but will remain available to this patient, the nursing and medical teams.  Please re-consult if needed. Thanks, Ladona Mow, MSN, RN, GNP, [place], [facility]ON-AP [phone])

## 2015-07-19 NOTE — Progress Notes (Signed)
Initial Nutrition Assessment  DOCUMENTATION CODES:   Severe malnutrition in context of acute illness/injury, Underweight  INTERVENTION:   Monitor magnesium, potassium, and phosphorus daily for at least 3 days, MD to replete as needed, as pt is at risk for refeeding syndrome given severe malnutrition and poor PO intake PTA.  -- RD to monitor patient for updated care plan.    NUTRITION DIAGNOSIS:   Malnutrition related to dysphagia, acute illness as evidenced by NPO status, severe depletion of muscle mass, severe depletion of body fat.   GOAL:   Patient will meet greater than or equal to 90% of their needs, Weight gain   MONITOR:   PO intake, Supplement acceptance, Diet advancement, Labs, Weight trends, TF tolerance, Skin, I & O's  REASON FOR ASSESSMENT:   Malnutrition Screening Tool, NPO/Clear Liquid Diet    ASSESSMENT:   81 year old, with hypernatremia, confusion, difficulty swallowing, dehyration and effusion of lower leg. Patient has a prior history of osteoarthritis, macular degeneration, Stroke, PAF, CKD stage 2, anemia, CVA, Dementia, HOH.   Pt sitting upright in room at time of visit, no family present. Patient BMI is 8.6 which is severely underweight. RD visit due to MST score. During visit patient was confused and not able to respond to questions. Unable to gather information on diet history and eating PTA. Per chart note, patient was not eating much for 3-4 days PTA.   Patient is severely malnourished and has lost 51% of body weight / 4 months earlier this year. Per previous chart note, patient was previously admitted for a stroke around the time of the weight loss.   Speech came to visit patient during visit, and pt has been placed on honey thickened liquids.   Patient is at high risk for refeeding syndrome due to previous poor appetite prior to admission. Recommend monitoring as diet is advanced.   Nutrition focused physical exam indicated severe muscle wasting  and severe fat wasting.   Medications reviewed. Milk of Magnesia; Zofran; Maaloxlmylanta.   Labs reviewed: high Na, high Cl, high BUN, low Ca, low Hg, low platelets, bilirubin, ketones, and protein present in urine.   Diet Order:  Diet clear liquid Room service appropriate?: Yes; Fluid consistency:: Honey Thick  Skin:  Wound (see comment) (Unstagable pressure ulcer on hip. )  Last BM:  PTA  Height:   Ht Readings from Last 1 Encounters:  07/18/15  (1.549 m)    Weight:   Wt Readings from Last 1 Encounters:  07/18/15 45 lb 3.1 oz (20.5 kg)    Ideal Body Weight:  47.7 kg (kg)  BMI:  Body mass index is 8.54 kg/(m^2).  Estimated Nutritional Needs:   Kcal:  500-800 kcal  Protein:  30-40 g  Fluid:  1 L/day  EDUCATION NEEDS:   No education needs identified at this time  Delano Metz, BA., BS.  Dietetic Intern

## 2015-07-20 DIAGNOSIS — I4891 Unspecified atrial fibrillation: Secondary | ICD-10-CM

## 2015-07-20 LAB — BASIC METABOLIC PANEL
ANION GAP: 4 — AB (ref 5–15)
BUN: 21 mg/dL — ABNORMAL HIGH (ref 6–20)
CALCIUM: 8.1 mg/dL — AB (ref 8.9–10.3)
CO2: 31 mmol/L (ref 22–32)
Chloride: 106 mmol/L (ref 101–111)
Creatinine, Ser: 0.67 mg/dL (ref 0.44–1.00)
GFR calc Af Amer: >60 mL/min (ref >60)
GLUCOSE: 131 mg/dL — AB (ref 65–99)
Potassium: 3.3 mmol/L — ABNORMAL LOW (ref 3.5–5.1)
SODIUM: 141 mmol/L (ref 135–145)

## 2015-07-20 MED ORDER — MORPHINE SULFATE (CONCENTRATE) 10 MG/0.5ML PO SOLN
5.0000 mg | ORAL | Status: DC | PRN
  Administered 2015-07-21 – 2015-07-25 (×3): 5 mg via ORAL
  Filled 2015-07-20 (×3): qty 0.5

## 2015-07-20 MED ORDER — POTASSIUM CHLORIDE 10 MEQ/100ML IV SOLN
10.0000 meq | INTRAVENOUS | Status: AC
  Administered 2015-07-20 (×5): 10 meq via INTRAVENOUS
  Filled 2015-07-20 (×5): qty 100

## 2015-07-20 MED ORDER — MORPHINE SULFATE (CONCENTRATE) 10 MG/0.5ML PO SOLN
5.0000 mg | Freq: Two times a day (BID) | ORAL | Status: DC
  Administered 2015-07-21: 5 mg via ORAL
  Filled 2015-07-20: qty 0.5

## 2015-07-20 NOTE — Care Management Important Message (Signed)
Important Message  Patient Details  Name: Deborah Gilbert MRN: [id number] Date of Birth: 05/21/1920   Medicare Important Message Given:  Yes-second notification given    Haskell Flirt 07/20/2015, 12:40 PMImportant Message  Patient Details  Name: Deborah Gilbert MRN: [id number] Date of Birth: Aug 01, 1920   Medicare Important Message Given:  Yes-second notification given    Haskell Flirt 07/20/2015, 12:40 PM

## 2015-07-20 NOTE — Consult Note (Signed)
Consultation Note Date: 07/20/2015   Patient Name: Deborah Gilbert  DOB: 1920-09-10  MRN: [id number]  Age / Sex: 81 y.o., female   PCP: Roderick Pee, MD Referring Physician: Marinda Elk, MD  Reason for Consultation: Disposition, Establishing goals of care and Terminal care  Palliative Care Assessment and Plan Summary of Established Goals of Care and Medical Treatment Preferences   Clinical Assessment/Narrative: 81 yo with end stage dementia, non-verbal, mostly bedr bound, severe malnutrition and history of stroke who was admitted with new right hip pressure ulcer and poor po intake at [facility]. She has no living family but has a HCPOA, Ms. Katrinka Blazing.   This very frail woman is approaching EOL, would avoid any unnecessary interventions or procedures- her body is severely malnourished and there is little if any chance of improvement or meaningful recovery given her advanced age and dementia progression. FAST 7c.   Contacts/Participants in Discussion: Primary Decision Maker: Ms. Katrinka Blazing HCPOA: yes  From ALF  Code Status/Advance Care Planning:  DNR  Symptom Management:   Pain- showing non-verbal signs of pain, very frail, grimacing and moaning periodically, skin tears, contractures, eyes open but unresponsive-grimacing   Will schedule low dose Roxanol for pain  Palliative Prophylaxis: bowel regimen  Additional Recommendations (Limitations, Scope, Preferences):  Comfort Feeding as tolerated  Minimize lab draws and procedures  No need for supplemental O2  Psycho-social/Spiritual:   Support System: poor   Desire for further Chaplaincy support:no  Prognosis: < 2 weeks  Discharge Planning:  Hospice facility if she continuesto have poor PO intake- I called her HCPOA but got her home answering machine. I strongly recommend shifting to a comfort care approach as the only medically reasonable and compassionate way to care for her.       Chief Complaint/History of  Present Illness: poor PO intake  Primary Diagnoses  Present on Admission:  . Hypernatremia . Dehydration . Failure to thrive in adult . UTI (urinary tract infection) . PAF history . Anemia of chronic renal failure, stage 2 (mild) . CKD (chronic kidney disease) stage 2, GFR 60-89 ml/min . Dementia without behavioral disturbance  Palliative Review of Systems:  I have reviewed the medical record, interviewed the patient and family, and examined the patient. The following aspects are pertinent.  Past Medical History  Diagnosis Date  . Painful respiration   . Effusion of lower leg joint   . Dysuria   . Family history of malignant neoplasm of gastrointestinal tract   . Diarrhea   . Painful respiration   . Effusion of lower leg joint   . HOH (hard of hearing)   . Osteoarthritis   . Macular degeneration    Social History   Social History  . Marital Status: Unknown    Spouse Name: N/A  . Number of Children: N/A  . Years of Education: N/A   Social History Main Topics  . Smoking status: Unknown If Ever Smoked  . Smokeless tobacco: None  . Alcohol Use: No  . Drug Use: No  . Sexual Activity: Not Asked   Other Topics Concern  . None   Social History Narrative   ** Merged History Encounter **       History reviewed. No pertinent family history. Scheduled Meds: . aspirin  325 mg Oral Daily  . atorvastatin  20 mg Oral q1800  . calcium-vitamin D  1 tablet Oral Daily  . cefTRIAXone (ROCEPHIN)  IV  1 g Intravenous Q24H  . citalopram  10  mg Oral Daily  . collagenase   Topical Daily  . heparin subcutaneous  2,500 Units Subcutaneous Q12H  . metoprolol tartrate  25 mg Oral BID  . potassium chloride  10 mEq Intravenous Q1 Hr x 5   Continuous Infusions:  PRN Meds:.acetaminophen **OR** acetaminophen, alum & mag hydroxide-simeth, HYDROmorphone (DILAUDID) injection, ondansetron **OR** ondansetron (ZOFRAN) IV, oxyCODONE, RESOURCE THICKENUP CLEAR Medications Prior to Admission:    Prior to Admission medications   Medication Sig Start Date End Date Taking? Authorizing Provider  aspirin 325 MG tablet Take 1 tablet (325 mg total) by mouth daily. 05/31/15  Yes Joseph Art, DO  atorvastatin (LIPITOR) 20 MG tablet Take 1 tablet (20 mg total) by mouth daily at 6 PM. 05/31/15  Yes Joseph Art, DO  Calcium Carbonate-Vitamin D (CALCIUM 600+D) 600-400 MG-UNIT per tablet Take 1 tablet by mouth daily.   Yes Historical Provider, MD  citalopram (CELEXA) 10 MG tablet Take 10 mg by mouth daily.   Yes Historical Provider, MD  Cranberry 250 MG CAPS Take 500 mg by mouth 2 (two) times daily.   Yes Historical Provider, MD  magnesium hydroxide (MILK OF MAGNESIA) 400 MG/5ML suspension Take 15 mLs by mouth daily as needed for mild constipation. If no relief in 8 hours give 6oz of prune juice with milk of mag   Yes Historical Provider, MD  meclizine (ANTIVERT) 12.5 MG tablet Take 12.5 mg by mouth daily.    Yes Historical Provider, MD  metoprolol tartrate (LOPRESSOR) 25 MG tablet Take 1 tablet (25 mg total) by mouth 2 (two) times daily. 08/20/14  Yes [name], MD  beta carotene w/minerals (OCUVITE) tablet Take 1 tablet by mouth daily. OCUVITE Patient not taking: Reported on 07/18/2015 01/11/15   Leatha Gilding, MD  ciprofloxacin (CIPRO) 500 MG tablet Take 1 tablet (500 mg total) by mouth 2 (two) times daily. Patient not taking: Reported on 07/18/2015 05/31/15   Joseph Art, DO  erythromycin ophthalmic ointment Place into both eyes every 6 (six) hours. Patient not taking: Reported on 07/18/2015 05/31/15   Joseph Art, DO   Allergies  Allergen Reactions  . Famotidine     REACTION: "felt like a heart attack"  . Pepcid [Famotidine] Other (See Comments)    Called patient's nursing home and they do not have any reactions known  . Tetracycline     REACTION: Severe nausea and vomiting  . Tetracyclines & Related     Per MAR  . Trimethoprim     Per MAR   CBC:    Component Value  Date/Time   WBC 5.3 07/19/2015 0544   HGB 11.8* 07/19/2015 0544   HCT 37.5 07/19/2015 0544   PLT 136* 07/19/2015 0544   MCV 91.9 07/19/2015 0544   NEUTROABS 5.1 07/18/2015 1743   LYMPHSABS 1.5 07/18/2015 1743   MONOABS 0.6 07/18/2015 1743   EOSABS 0.0 07/18/2015 1743   BASOSABS 0.0 07/18/2015 1743   Comprehensive Metabolic Panel:    Component Value Date/Time   NA 141 07/20/2015 0420   K 3.3* 07/20/2015 0420   CL 106 07/20/2015 0420   CO2 31 07/20/2015 0420   BUN 21* 07/20/2015 0420   CREATININE 0.67 07/20/2015 0420   GLUCOSE 131* 07/20/2015 0420   CALCIUM 8.1* 07/20/2015 0420   AST 18 07/18/2015 1743   ALT 11* 07/18/2015 1743   ALKPHOS 69 07/18/2015 1743   BILITOT 1.2 07/18/2015 1743   PROT 6.1* 07/18/2015 1743   ALBUMIN 2.8* 07/18/2015 1743  Physical Exam: Vital Signs: BP 103/45 mmHg  Pulse 80  Temp(Src) 98 F (36.7 C) (Axillary)  Resp 18  Ht 5\' 1"  (1.549 m)  Wt 20.5 kg (45 lb 3.1 oz)  BMI 8.54 kg/m2  SpO2 97% SpO2: SpO2: 97 % O2 Device: O2 Device: Nasal Cannula O2 Flow Rate: O2 Flow Rate (L/min): 2 L/min Intake/output summary:  Intake/Output Summary (Last 24 hours) at 07/20/15 1655 Last data filed at 07/20/15 1300  Gross per 24 hour  Intake     80 ml  Output      0 ml  Net     80 ml   LBM:   Baseline Weight: Weight: 20.5 kg (45 lb 3.1 oz) Most recent weight: Weight: 20.5 kg (45 lb 3.1 oz)  Exam Findings:           Palliative Performance Scale: 20               Additional Data Reviewed: Recent Labs     07/18/15  1743  07/19/15  0544  07/20/15  0420  WBC  7.2  5.3   --   HGB  12.6  11.8*   --   PLT  198  136*   --   NA  154*  152*  141  BUN  34*  31*  21*  CREATININE  0.93  0.66  0.67     Time In: 4:30  Time Out: 500  Time Total: 30 min Greater than 50%  of this time was spent counseling and coordinating care related to the above assessment and plan.  Signed by: Hilbert Odor, DO  07/20/2015, 4:55 PM    Please contact Palliative Medicine Team phone at [phone] for questions and concerns.

## 2015-07-20 NOTE — Clinical Documentation Improvement (Signed)
Hospitalist   Severe Malnutrition  Malnutrition  Other condition  Unable to clinically determine  Please update your documentation within the medical record to reflect your response to this query. Document any associated diagnoses/conditions.  Supporting Information: Initial Nutrition Assessment on 07/19/2015 1:30 PM   DOCUMENTATION CODES:  Severe malnutrition in context of acute illness/injury, Underweight  INTERVENTION:  Monitor magnesium, potassium, and phosphorus daily for at least 3 days, MD to replete as needed, as pt is at risk for refeeding syndrome given severe malnutrition and poor PO intake PTA.  -- RD to monitor patient for updated care plan.  NUTRITION DIAGNOSIS:  Malnutrition related to dysphagia, acute illness as evidenced by NPO status, severe depletion of muscle mass, severe depletion of body fat.  GOAL:  Patient will meet greater than or equal to 90% of their needs, Weight gain  MONITOR:  PO intake, Supplement acceptance, Diet advancement, Labs, Weight trends, TF tolerance, Skin, I & O's   Please exercise your independent, professional judgment when responding. A specific answer is not anticipated or expected.  Thank You, Nevin Bloodgood, RN, BSN, CCDS,Clinical Documentation Specialist:  [phone]  [phone]=[facility]- Health Information Management

## 2015-07-20 NOTE — Progress Notes (Signed)
Patient unable to receive medications as she will not swallow applesauce with crushed meds.  She pockets food and cheek and will not swallow.  Applesauce suctioned from mouth

## 2015-07-20 NOTE — Progress Notes (Signed)
Speech Language Pathology Treatment: Dysphagia  Patient Details Name: NONA GRACEY MRN: [id number] DOB: 28-Feb-1920 Today's Date: 07/20/2015 Time: 7829-5621 SLP Time Calculation (min) (ACUTE ONLY): 10 min  Assessment / Plan / Recommendation Clinical Impression  SLP provided skilled observation during lunch meal, consisting of honey thick liquids via spoon. Pt requires Mod cues for oral acceptance and manipulation, although she does not have overt signs of aspiration. Note per chart review that pt has been pocketing puree during attempts at medication administration, requiring suctioning of POs. Recommend to continue with honey thick liquids only via spoon. RN may wish to crush pills into honey thick liquids to facilitate oral clearance.    HPI Other Pertinent Information: 81 yo female adm with right hip pressure ulcer - diagnosed with hypernatremia.  Pt resides in SNF.  Poor intake prior to admit reported and pt on clears but was noted to be coughing during breakfast. Pt was made NPO and slp eval ordered.  No family present.  Earlier attempt pt was sleepy but currenlty more alert.    Pertinent Vitals Pain Assessment: Faces Faces Pain Scale: No hurt  SLP Plan  Continue with current plan of care    Recommendations Diet recommendations: Honey-thick liquid Liquids provided via: Teaspoon Medication Administration: Other (Comment) (crushed in honey thick liquids) Supervision: Staff to assist with self feeding;Full supervision/cueing for compensatory strategies Compensations: Minimize environmental distractions;Slow rate;Small sips/bites;Check for pocketing Postural Changes and/or Swallow Maneuvers: Seated upright 90 degrees;Upright 30-60 min after meal       Oral Care Recommendations: Oral care BID;Oral care before and after PO Follow up Recommendations: Skilled Nursing facility;24 hour supervision/assistance Plan: Continue with current plan of care    Maxcine Ham, M.A.  CCC-SLP [phone]  Maxcine Ham 07/20/2015, 12:14 PM

## 2015-07-20 NOTE — Progress Notes (Signed)
[facility] PROGRESS NOTE  Assessment/Plan: Hypernatremia - Patient does not have any living family has she has outlived them all, her son passed away several years ago. I have contacted Mrs. Katrinka Blazing. - According to Ms. Katrinka Blazing she is to healthcare power of attorney and vascular to bring documentation of this. - She relates that she understand the patient is been going downhill for the last several months and she has agreed to meet with hospice and Palliative care. - I have explained to her that her condition cannot be fixed, we could actually fix her numbers but we are afraid that this is eventually going to happen again.  Pressure ulcer: - Wound care consult recommended enzymatic debridement  Failure to thrive in adult: History of CVA (cerebrovascular accident) UTI (urinary tract infection) Urine cultures and sensitivities pending, she was started empirically on IV Rocephin.  Anemia of chronic renal failure, stage 2 (mild) Hemoglobin continues to be still expect a drop in her hemoglobin as she is volume resuscitated.  PAF history Lopressor control.  CKD (chronic kidney disease) stage 2, GFR 60-89 ml/min Creatinine improving as well as BUN/creatinine with IV hydration.  Dementia without behavioral disturbance Use haldol when necessary for agitation    Code Status: DNR Family Communication: none  Disposition Plan: inpatient for 2-4 days   Consultants:  PMT  Procedures:  CXR  Antibiotics:  Rocephin  HPI/Subjective: Non verbal  Objective: Filed Vitals:   07/19/15 1415 07/19/15 1800 07/19/15 2300 07/20/15 0523  BP: 122/58 116/45 104/45 118/58  Pulse: 72 66 65 72  Temp: 97.6 F (36.4 C) 97.7 F (36.5 C) 97.7 F (36.5 C) 97.6 F (36.4 C)  TempSrc:  Axillary Axillary Axillary  Resp: 20 16 16 16   Height:      Weight:      SpO2: 99% 98% 100% 95%    Intake/Output Summary (Last 24 hours) at 07/20/15 1227 Last data filed at 07/20/15 0951  Gross per 24  hour  Intake 1218.75 ml  Output      0 ml  Net 1218.75 ml   Filed Weights   07/18/15 2231  Weight: 20.5 kg (45 lb 3.1 oz)    Exam:  General:  in no acute distress.  HEENT: No bruits, no goiter.  Heart: Regular rate and rhythm.  Lungs: Good air movement, clear Abdomen: Soft, nontender, nondistended, positive bowel sounds.  Neuro: Grossly intact, nonfocal.   Data Reviewed: Basic Metabolic Panel:  Recent Labs Lab 07/18/15 1743 07/19/15 0544 07/20/15 0420  NA 154* 152* 141  K 3.9 3.9 3.3*  CL 115* 117* 106  CO2 30 28 31   GLUCOSE 110* 109* 131*  BUN 34* 31* 21*  CREATININE 0.93 0.66 0.67  CALCIUM 8.9 8.3* 8.1*   Liver Function Tests:  Recent Labs Lab 07/18/15 1743  AST 18  ALT 11*  ALKPHOS 69  BILITOT 1.2  PROT 6.1*  ALBUMIN 2.8*   No results for input(s): LIPASE, AMYLASE in the last 168 hours. No results for input(s): AMMONIA in the last 168 hours. CBC:  Recent Labs Lab 07/18/15 1743 07/19/15 0544  WBC 7.2 5.3  NEUTROABS 5.1  --   HGB 12.6 11.8*  HCT 39.9 37.5  MCV 91.3 91.9  PLT 198 136*   Cardiac Enzymes: No results for input(s): CKTOTAL, CKMB, CKMBINDEX, TROPONINI in the last 168 hours. BNP (last 3 results)  Recent Labs  01/07/15 1307  BNP 137.9*    ProBNP (last 3 results) No results for input(s): PROBNP in the  last 8760 hours.  CBG: No results for input(s): GLUCAP in the last 168 hours.  Recent Results (from the past 240 hour(s))  Urine culture     Status: None (Preliminary result)   Collection Time: 07/18/15  6:06 PM  Result Value Ref Range Status   Specimen Description URINE, CLEAN CATCH  Final   Special Requests NONE  Final   Culture   Final    CULTURE REINCUBATED FOR BETTER GROWTH Performed at [facility]    Report Status PENDING  Incomplete  Culture, blood (routine x 2)     Status: None (Preliminary result)   Collection Time: 07/18/15  7:49 PM  Result Value Ref Range Status   Specimen Description BLOOD RIGHT  ANTECUBITAL  Final   Special Requests BOTTLES DRAWN AEROBIC AND ANAEROBIC 5CC  Final   Culture   Final    NO GROWTH < 24 HOURS Performed at [facility]    Report Status PENDING  Incomplete  Culture, blood (routine x 2)     Status: None (Preliminary result)   Collection Time: 07/18/15  7:55 PM  Result Value Ref Range Status   Specimen Description BLOOD LEFT ANTECUBITAL  Final   Special Requests BOTTLES DRAWN AEROBIC AND ANAEROBIC 5CC  Final   Culture   Final    NO GROWTH < 24 HOURS Performed at [facility]    Report Status PENDING  Incomplete     Studies: Dg Chest 1 View  07/18/2015   CLINICAL DATA:  Pressure ulcer RIGHT hip worsening over 2 weeks, mental status changes over same time frame, not eating, dementia  EXAM: CHEST  1 VIEW  COMPARISON:  05/29/2015  FINDINGS: Rotated to the LEFT.  Normal heart size, mediastinal contours and pulmonary vascularity.  Emphysematous changes with biapical scarring.  No gross acute infiltrate, pleural effusion or pneumothorax.  Significant osseous demineralization.  IMPRESSION: COPD changes with biapical scarring.  No acute abnormalities.   Electronically Signed   By: Ulyses Southward M.D.   On: 07/18/2015 17:48   Dg Hip Unilat With Pelvis 1v Right  07/18/2015   CLINICAL DATA:  Pressure ulcer RIGHT hip worsening over 2 weeks, mental status changes for 2 weeks  EXAM: DG HIP (WITH OR WITHOUT PELVIS) 1V RIGHT  COMPARISON:  None  FINDINGS: Marked osseous demineralization.  RIGHT hip prosthesis.  Old healed fracture inferior RIGHT pubic ramus.  No acute fracture, dislocation or bone destruction.  Lateral margins of the iliac crest superiorly are overpenetrated and inadequately assessed.  No periprosthetic lucency or gross evidence of osteomyelitis.  IMPRESSION: Osseous demineralization with RIGHT hip prosthesis.  No acute abnormalities.   Electronically Signed   By: Ulyses Southward M.D.   On: 07/18/2015 17:50    Scheduled Meds: . aspirin  325 mg Oral  Daily  . atorvastatin  20 mg Oral q1800  . calcium-vitamin D  1 tablet Oral Daily  . cefTRIAXone (ROCEPHIN)  IV  1 g Intravenous Q24H  . citalopram  10 mg Oral Daily  . collagenase   Topical Daily  . heparin subcutaneous  2,500 Units Subcutaneous Q12H  . metoprolol tartrate  25 mg Oral BID  . potassium chloride  10 mEq Intravenous Q1 Hr x 5   Continuous Infusions:    Time Spent: 25 min   Marinda Elk  [facility] Pager [phone].  If 7PM-7AM, please contact night-coverage at [email or web address], password [facility] 07/20/2015, 12:27 PM  LOS: 2 days

## 2015-07-21 DIAGNOSIS — Z515 Encounter for palliative care: Secondary | ICD-10-CM | POA: Diagnosis present

## 2015-07-21 DIAGNOSIS — N179 Acute kidney failure, unspecified: Secondary | ICD-10-CM

## 2015-07-21 LAB — URINE CULTURE

## 2015-07-21 MED ORDER — MORPHINE SULFATE (CONCENTRATE) 10 MG/0.5ML PO SOLN
5.0000 mg | Freq: Three times a day (TID) | ORAL | Status: DC
  Administered 2015-07-21 – 2015-07-22 (×3): 5 mg via ORAL
  Filled 2015-07-21 (×3): qty 0.5

## 2015-07-21 MED ORDER — POTASSIUM CHLORIDE 20 MEQ/15ML (10%) PO SOLN
40.0000 meq | Freq: Two times a day (BID) | ORAL | Status: DC
  Filled 2015-07-21 (×2): qty 30

## 2015-07-21 MED ORDER — HYDROMORPHONE HCL 1 MG/ML IJ SOLN: 0.2500 mg | INTRAMUSCULAR | Status: DC | PRN

## 2015-07-21 MED ORDER — MORPHINE SULFATE (CONCENTRATE) 10 MG/0.5ML PO SOLN: 5.0000 mg | Freq: Three times a day (TID) | ORAL | Status: DC | PRN

## 2015-07-21 MED ORDER — POTASSIUM CHLORIDE 10 MEQ/100ML IV SOLN
10.0000 meq | INTRAVENOUS | Status: DC
  Administered 2015-07-21 (×3): 10 meq via INTRAVENOUS
  Filled 2015-07-21 (×5): qty 100

## 2015-07-21 NOTE — Progress Notes (Signed)
{  SLP ALSLP Cancellation Note  Patient Details Name: AYN DOMANGUE MRN: [id number] DOB: Aug 13, 1920   Cancelled treatment:       Reason Eval/Treat Not Completed:  (order received to discontinue slp, pt hospice/comfort care and not consuming po intake, thanks)   Donavan Burnet, MS [facility] SLP [phone]

## 2015-07-21 NOTE — Progress Notes (Signed)
MD notified pt IV access infiltrated.  Discussed need for IV access.  MD wants pt to have Potassium runs.  IV team notified for access.

## 2015-07-21 NOTE — Progress Notes (Addendum)
Patient placed on contact precautions per infection prevention due to MRSA in urine. 1400 MD made aware via amnion

## 2015-07-21 NOTE — Progress Notes (Signed)
[facility] PROGRESS NOTE  Assessment/Plan: Hypernatremia - Patient does not have any living family, has she has outlived them all, her son passed away several years ago. I have contacted Mrs. Katrinka Blazing. - According to Ms. Katrinka Blazing she is to healthcare power of attorney and vascular to bring documentation of this. - PMT consulted. - UC showed no growth. D/c antibiotics coverage  Pressure ulcer: - Wound care consult recommended enzymatic debridement  Failure to thrive in adult: History of CVA (cerebrovascular accident)  Anemia of chronic renal failure, stage 2 (mild) Mild drop in Hbg.  PAF history Lopressor control.  AKI on CKD (chronic kidney disease) stage 2, GFR 60-89 ml/min Return to baseline with IV hydration.  Dementia without behavioral disturbance Use haldol when necessary for agitation    Code Status: DNR Family Communication: none  Disposition Plan: d/c in 1 days.   Consultants:  PMT  Procedures:  CXR  Antibiotics:  Rocephin  HPI/Subjective: Non verbal  Objective: Filed Vitals:   07/20/15 0523 07/20/15 1343 07/20/15 2108 07/21/15 0545  BP: 118/58 103/45 100/55 124/57  Pulse: 72 80 76 81  Temp: 97.6 F (36.4 C) 98 F (36.7 C) 98 F (36.7 C) 97.7 F (36.5 C)  TempSrc: Axillary Axillary Axillary Axillary  Resp: 16 18 16 18   Height:      Weight:      SpO2: 95% 97% 97% 94%    Intake/Output Summary (Last 24 hours) at 07/21/15 1002 Last data filed at 07/21/15 0500  Gross per 24 hour  Intake    500 ml  Output      0 ml  Net    500 ml   Filed Weights   07/18/15 2231  Weight: 20.5 kg (45 lb 3.1 oz)    Exam:  General:  in no acute distress.  cachectic HEENT: No bruits, no goiter.  Heart: Regular rate and rhythm.  Lungs: Good air movement, clear Abdomen: Soft, nontender, nondistended, positive bowel sounds.  Neuro: Grossly intact, nonfocal.   Data Reviewed: Basic Metabolic Panel:  Recent Labs Lab 07/18/15 1743  07/19/15 0544 07/20/15 0420  NA 154* 152* 141  K 3.9 3.9 3.3*  CL 115* 117* 106  CO2 30 28 31   GLUCOSE 110* 109* 131*  BUN 34* 31* 21*  CREATININE 0.93 0.66 0.67  CALCIUM 8.9 8.3* 8.1*   Liver Function Tests:  Recent Labs Lab 07/18/15 1743  AST 18  ALT 11*  ALKPHOS 69  BILITOT 1.2  PROT 6.1*  ALBUMIN 2.8*   No results for input(s): LIPASE, AMYLASE in the last 168 hours. No results for input(s): AMMONIA in the last 168 hours. CBC:  Recent Labs Lab 07/18/15 1743 07/19/15 0544  WBC 7.2 5.3  NEUTROABS 5.1  --   HGB 12.6 11.8*  HCT 39.9 37.5  MCV 91.3 91.9  PLT 198 136*   Cardiac Enzymes: No results for input(s): CKTOTAL, CKMB, CKMBINDEX, TROPONINI in the last 168 hours. BNP (last 3 results)  Recent Labs  01/07/15 1307  BNP 137.9*    ProBNP (last 3 results) No results for input(s): PROBNP in the last 8760 hours.  CBG: No results for input(s): GLUCAP in the last 168 hours.  Recent Results (from the past 240 hour(s))  Urine culture     Status: None (Preliminary result)   Collection Time: 07/18/15  6:06 PM  Result Value Ref Range Status   Specimen Description URINE, CLEAN CATCH  Final   Special Requests NONE  Final   Culture  Final    >=100,000 COLONIES/mL STAPHYLOCOCCUS AUREUS Performed at [facility]    Report Status PENDING  Incomplete  Culture, blood (routine x 2)     Status: None (Preliminary result)   Collection Time: 07/18/15  7:49 PM  Result Value Ref Range Status   Specimen Description BLOOD RIGHT ANTECUBITAL  Final   Special Requests BOTTLES DRAWN AEROBIC AND ANAEROBIC 5CC  Final   Culture   Final    NO GROWTH 2 DAYS Performed at [facility]    Report Status PENDING  Incomplete  Culture, blood (routine x 2)     Status: None (Preliminary result)   Collection Time: 07/18/15  7:55 PM  Result Value Ref Range Status   Specimen Description BLOOD LEFT ANTECUBITAL  Final   Special Requests BOTTLES DRAWN AEROBIC AND ANAEROBIC  5CC  Final   Culture   Final    NO GROWTH 2 DAYS Performed at [facility]    Report Status PENDING  Incomplete     Studies: No results found.  Scheduled Meds: . aspirin  325 mg Oral Daily  . calcium-vitamin D  1 tablet Oral Daily  . cefTRIAXone (ROCEPHIN)  IV  1 g Intravenous Q24H  . citalopram  10 mg Oral Daily  . collagenase   Topical Daily  . heparin subcutaneous  2,500 Units Subcutaneous Q12H  . metoprolol tartrate  25 mg Oral BID  . morphine CONCENTRATE  5 mg Oral Q12H   Continuous Infusions:    Time Spent: 15 min   FELIZ Rosine Beat  [facility] Pager [phone].  If 7PM-7AM, please contact night-coverage at [email or web address], password [facility] 07/21/2015, 10:02 AM  LOS: 3 days

## 2015-07-21 NOTE — Progress Notes (Signed)
Daily Progress Note   Patient Name: Deborah Gilbert       Date: 07/21/2015 DOB: 07/04/1920  Age: 81 y.o. MRN#: [id number] Attending Physician: Marinda Elk, MD Primary Care Physician: Evette Georges, MD Admit Date: 07/18/2015  Reason for Consultation/Follow-up: Establishing goals of care, Inpatient hospice referral, Non pain symptom management, Pain control, Psychosocial/spiritual support and Terminal care  Subjective: Pt minimally responsive. Unable to take PO meds today. Has only taken a sips or two. Has required prn pain medication. Resp unlabored. Spoke to [name] her HCPOA. Discussed a prognosis of hours to less than a week. She is sad but not surprised by the prognosis. Feels she will be going to a better place. We also discussed that she could die suddenly in the hospital but also discussed in-patient hospice as an option.  Interval Events: Decreasing LOC; minimally responsive and unable to take PO's Length of Stay: 3 days  Current Medications: Scheduled Meds:  . collagenase   Topical Daily  . heparin subcutaneous  2,500 Units Subcutaneous Q12H  . metoprolol tartrate  25 mg Oral BID  . morphine CONCENTRATE  5 mg Oral 3 times per day    Continuous Infusions:    PRN Meds: [DISCONTINUED] acetaminophen **OR** acetaminophen, alum & mag hydroxide-simeth, HYDROmorphone (DILAUDID) injection, morphine CONCENTRATE, [DISCONTINUED] ondansetron **OR** ondansetron (ZOFRAN) IV  Palliative Performance Scale: 10%     Vital Signs: BP 120/52 mmHg  Pulse 80  Temp(Src) 98 F (36.7 C) (Axillary)  Resp 18  Ht 5\' 1"  (1.549 m)  Wt 20.5 kg (45 lb 3.1 oz)  BMI 8.54 kg/m2  SpO2 95% SpO2: SpO2: 95 % O2 Device: O2 Device: Not Delivered O2 Flow Rate: O2 Flow Rate (L/min): 2 L/min  Intake/output summary:  Intake/Output Summary (Last 24 hours) at 07/21/15 1731 Last data filed at 07/21/15 1333  Gross per 24 hour  Intake    470 ml  Output      0 ml  Net    470 ml    LBM:   Baseline Weight: Weight: 20.5 kg (45 lb 3.1 oz) Most recent weight: Weight: 20.5 kg (45 lb 3.1 oz)  Physical Exam: Gen: Frail elderly female. Minimally responsive. Lying in bed Resp: No work of breathing               Additional Data Reviewed: Recent Labs     07/18/15  1743  07/19/15  0544  07/20/15  0420  WBC  7.2  5.3   --   HGB  12.6  11.8*   --   PLT  198  136*   --   NA  154*  152*  141  BUN  34*  31*  21*  CREATININE  0.93  0.66  0.67     Problem List:  Patient Active Problem List   Diagnosis Date Noted  . Pressure ulcer 07/19/2015  . Hypernatremia 07/18/2015  . Dehydration 07/18/2015  . Failure to thrive in adult 07/18/2015  . History of CVA (cerebrovascular accident) 07/18/2015  . 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 CVA (cerebral infarction) 05/29/2015  . Conjunctivitis 05/29/2015  . Rib pain 01/07/2015  . Dehydration, mild 01/07/2015  . Abnormal urinalysis 01/07/2015  . Cellulitis of wrist 10/22/2014  . Cellulitis and abscess of hand 10/22/2014  . UTI (urinary tract infection) 10/22/2014  . Anemia of chronic renal failure, stage 2 (mild) 08/20/2014  . Dysphagia 08/20/2014  . Protein-calorie malnutrition, severe 08/19/2014  .  PAF history 08/18/2014  . History of vertigo 08/18/2014  . Dementia without behavioral disturbance 08/18/2014  . CKD (chronic kidney disease) stage 2, GFR 60-89 ml/min 08/18/2014  . Atrial fibrillation 08/18/2014  . UTI (lower urinary tract infection) 02/26/2011  . Hearing loss 03/01/2010  . DEPRESSIVE DISORDER 01/02/2010  . OSTEOARTHRITIS 09/20/2009     Palliative Care Assessment & Plan    Code Status:  DNR  Goals of Care:  Comfort care  Pt is transitioning, early active towards EOL  Stop PO meds  Symptom Management:  Pain: Overall improved. Will up titrate ms04 concentrate to TID and q2 prn dosing. Will leave dilaudid IV for more rapid alternative if pt has acute, sudden  onset of pain or dyspnea  Dyspnea: Cont with opioids as primary mgt tool, 02 as tolerated.  Palliative Prophylaxis:  No aggressive bowel care as pt is dying, no longer eating  Psycho-social/Spiritual:  Desire for further Chaplaincy support:no   Prognosis: Hours - Days Discharge Planning: Hospital death vs in-patient hospice. Pt resided in ALF and is not a candidate to return there. She has no family   Care plan was discussed with her HCPOA [name]. Will FU tomorrow. If pt survives tonight, will consider hospice in-patient if bed available  Thank you for allowing the Palliative Medicine Team to assist in the care of this patient.   Time In: 1530 Time Out: 1610 Total Time 35 min Prolonged Time Billed  no     Greater than 50%  of this time was spent counseling and coordinating care related to the above assessment and plan.   Irean Hong, NP  07/21/2015, 5:31 PM  Please contact Palliative Medicine Team phone at [phone] for questions and concerns.

## 2015-07-22 MED ORDER — MORPHINE SULFATE (CONCENTRATE) 10 MG/0.5ML PO SOLN
5.0000 mg | Freq: Four times a day (QID) | ORAL | Status: DC
  Administered 2015-07-22 – 2015-07-23 (×4): 5 mg via ORAL
  Filled 2015-07-22 (×4): qty 0.5

## 2015-07-22 MED ORDER — RESOURCE THICKENUP CLEAR PO POWD
ORAL | Status: DC | PRN
  Filled 2015-07-22: qty 125

## 2015-07-22 MED ORDER — LIP MEDEX EX OINT
TOPICAL_OINTMENT | CUTANEOUS | Status: AC
  Administered 2015-07-22: 1
  Filled 2015-07-22: qty 7

## 2015-07-22 NOTE — Progress Notes (Signed)
Daily Progress Note   Patient Name: Deborah Gilbert       Date: 07/22/2015 DOB: 02-17-1920  Age: 81 y.o. MRN#: [id number] Attending Physician: Marinda Elk, MD Primary Care Physician: Evette Georges, MD Admit Date: 07/18/2015  Reason for Consultation/Follow-up: Inpatient hospice referral, Non pain symptom management, Pain control and Terminal care  Subjective: Continues to be minimally responsive. Is moaning when I enter the room this afternoon.    Interval Events: Minimally responsive, increased pain Length of Stay: 4 days  Current Medications: Scheduled Meds:  . heparin subcutaneous  2,500 Units Subcutaneous Q12H  . morphine CONCENTRATE  5 mg Oral 4 times per day    Continuous Infusions:    PRN Meds: HYDROmorphone (DILAUDID) injection, morphine CONCENTRATE, [DISCONTINUED] ondansetron **OR** ondansetron (ZOFRAN) IV, RESOURCE THICKENUP CLEAR  Palliative Performance Scale: 10%     Vital Signs: BP 136/69 mmHg  Pulse 108  Temp(Src) 98.4 F (36.9 C) (Axillary)  Resp 16  Ht  (1.549 m)  Wt 20.5 kg (45 lb 3.1 oz)  BMI 8.54 kg/m2  SpO2 91% SpO2: SpO2: 91 % O2 Device: O2 Device: Not Delivered O2 Flow Rate: O2 Flow Rate (L/min): 2 L/min  Intake/output summary:  Intake/Output Summary (Last 24 hours) at 07/22/15 1654 Last data filed at 07/22/15 1200  Gross per 24 hour  Intake    420 ml  Output      0 ml  Net    420 ml   LBM:   Baseline Weight: Weight: 20.5 kg (45 lb 3.1 oz) Most recent weight: Weight: 20.5 kg (45 lb 3.1 oz)  Physical Exam: General: Cachetic frail elderly female. Minimally responsive, moaning Resp: No work of breathing              Additional Data Reviewed: Recent Labs     07/20/15  0420  NA  141  BUN  21*  CREATININE  0.67     Problem List:  Patient Active Problem List   Diagnosis Date Noted  . Palliative care encounter   . Pressure ulcer 07/19/2015  . Hypernatremia 07/18/2015  . Dehydration 07/18/2015  . Failure to  thrive in adult 07/18/2015  . History of CVA (cerebrovascular accident) 07/18/2015  . 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 CVA (cerebral infarction) 05/29/2015  . Conjunctivitis 05/29/2015  . Rib pain 01/07/2015  . Dehydration, mild 01/07/2015  . Abnormal urinalysis 01/07/2015  . Cellulitis of wrist 10/22/2014  . Cellulitis and abscess of hand 10/22/2014  . UTI (urinary tract infection) 10/22/2014  . Anemia of chronic renal failure, stage 2 (mild) 08/20/2014  . Dysphagia 08/20/2014  . Protein-calorie malnutrition, severe 08/19/2014  . PAF history 08/18/2014  . History of vertigo 08/18/2014  . Dementia without behavioral disturbance 08/18/2014  . CKD (chronic kidney disease) stage 2, GFR 60-89 ml/min 08/18/2014  . Atrial fibrillation 08/18/2014  . UTI (lower urinary tract infection) 02/26/2011  . Hearing loss 03/01/2010  . DEPRESSIVE DISORDER 01/02/2010  . OSTEOARTHRITIS 09/20/2009     Palliative Care Assessment & Plan    Code Status:  DNR  Goals of Care:  Comfort care  Awaiting in-patient hospice bed  Symptom Management:  Pain: Needs improvement. Increase ms04 5 mg q6 atc and q2 prn  Palliative Prophylaxis:  No aggressive measures for bowels. Pt is actively dying  Psycho-social/Spiritual:  Desire for further Chaplaincy support:no   Prognosis: Hours - Days Discharge Planning: Hospice facility   Care plan was discussed with [facility]  Thank you for allowing the Palliative Medicine Team to assist in the care of this patient.   Time In: 1600 Time Out: 1625 Total Time 25 min Prolonged Time Billed  no     Greater than 50%  of this time was spent counseling and coordinating care related to the above assessment and plan.  Irean Hong, NP  07/22/2015, 4:54 PM  Please contact Palliative Medicine Team phone at [phone] for questions and concerns.

## 2015-07-22 NOTE — Progress Notes (Signed)
[facility] PROGRESS NOTE  Assessment/Plan: Hypernatremia - Patient does not have any living family, has she has outlived them all, her son passed away several years ago. I have contacted Mrs. Smith. Healthcare power of attorney met with hospice and palliative care decided to move towards comfort care. All medications were stopped dialysis treatments. We'll focus on comfort. Awaiting residential hospice placement.  Pressure ulcer: Failure to thrive in adult: History of CVA (cerebrovascular accident) Anemia of chronic renal failure, stage 2 (mild) PAF history AKI on CKD (chronic kidney disease) stage 2, GFR 60-89 ml/min Dementia without behavioral disturbance     Code Status: DNR Family Communication: none  Disposition Plan: d/c in 1 days.   Consultants:  PMT  Procedures:  CXR  Antibiotics:  Rocephin  HPI/Subjective: Non verbal  Objective: Filed Vitals:   07/21/15 0545 07/21/15 1026 07/21/15 1523 07/21/15 2154  BP: 124/57  120/52 122/64  Pulse: 81  80 94  Temp: 97.7 F (36.5 C)  98 F (36.7 C) 98 F (36.7 C)  TempSrc: Axillary  Axillary Oral  Resp: _0 Height:      Weight:      SpO2: 94%  95% 96%    Intake/Output Summary (Last 24 hours) at 07/22/15 1030 Last data filed at 07/21/15 1848  Gross per 24 hour  Intake    220 ml  Output      0 ml  Net    220 ml   Filed Weights   07/18/15 2231  Weight: 20.5 kg (45 lb 3.1 oz)    Exam:  General:  in no acute distress.  cachectic HEENT: No bruits, no goiter.  Heart: Regular rate and rhythm.  Lungs: Good air movement, clear Abdomen: Soft, nontender, nondistended, positive bowel sounds.  Neuro: Grossly intact, nonfocal.   Data Reviewed: Basic Metabolic Panel:  Recent Labs Lab 07/18/15 1743 07/19/15 0544 07/20/15 0420  NA 154* 152* 141  K 3.9 3.9 3.3*  CL 115* 117* 106  CO2 _1 GLUCOSE 110* 109* 131*  BUN 34* 31* 21*  CREATININE 0.93 0.66 0.67  CALCIUM 8.9 8.3*  8.1*   Liver Function Tests:  Recent Labs Lab 07/18/15 1743  AST 18  ALT 11*  ALKPHOS 69  BILITOT 1.2  PROT 6.1*  ALBUMIN 2.8*   No results for input(s): LIPASE, AMYLASE in the last 168 hours. No results for input(s): AMMONIA in the last 168 hours. CBC:  Recent Labs Lab 07/18/15 1743 07/19/15 0544  WBC 7.2 5.3  NEUTROABS 5.1  --   HGB 12.6 11.8*  HCT 39.9 37.5  MCV 91.3 91.9  PLT 198 136*   Cardiac Enzymes: No results for input(s): CKTOTAL, CKMB, CKMBINDEX, TROPONINI in the last 168 hours. BNP (last 3 results)  Recent Labs  01/07/15 1307  BNP 137.9*    ProBNP (last 3 results) No results for input(s): PROBNP in the last 8760 hours.  CBG: No results for input(s): GLUCAP in the last 168 hours.  Recent Results (from the past 240 hour(s))  Urine culture     Status: None   Collection Time: 07/18/15  6:06 PM  Result Value Ref Range Status   Specimen Description URINE, CLEAN CATCH  Final   Special Requests NONE  Final   Culture   Final    >=100,000 COLONIES/mL METHICILLIN RESISTANT STAPHYLOCOCCUS AUREUS Performed at [facility]    Report Status 07/21/2015 FINAL  Final   Organism ID, Bacteria METHICILLIN RESISTANT STAPHYLOCOCCUS AUREUS  Final      Susceptibility   Methicillin resistant staphylococcus aureus - MIC*    CIPROFLOXACIN >=8 RESISTANT Resistant     GENTAMICIN <=0.5 SENSITIVE Sensitive     NITROFURANTOIN <=16 SENSITIVE Sensitive     OXACILLIN >=4 RESISTANT Resistant     TETRACYCLINE <=1 SENSITIVE Sensitive     VANCOMYCIN <=0.5 SENSITIVE Sensitive     TRIMETH/SULFA <=10 SENSITIVE Sensitive     CLINDAMYCIN <=0.25 RESISTANT Resistant     RIFAMPIN <=0.5 SENSITIVE Sensitive     Inducible Clindamycin POSITIVE Resistant     * >=100,000 COLONIES/mL METHICILLIN RESISTANT STAPHYLOCOCCUS AUREUS  Culture, blood (routine x 2)     Status: None (Preliminary result)   Collection Time: 07/18/15  7:49 PM  Result Value Ref Range Status   Specimen  Description BLOOD RIGHT ANTECUBITAL  Final   Special Requests BOTTLES DRAWN AEROBIC AND ANAEROBIC 5CC  Final   Culture   Final    NO GROWTH 3 DAYS Performed at [facility]    Report Status PENDING  Incomplete  Culture, blood (routine x 2)     Status: None (Preliminary result)   Collection Time: 07/18/15  7:55 PM  Result Value Ref Range Status   Specimen Description BLOOD LEFT ANTECUBITAL  Final   Special Requests BOTTLES DRAWN AEROBIC AND ANAEROBIC 5CC  Final   Culture   Final    NO GROWTH 3 DAYS Performed at [facility]    Report Status PENDING  Incomplete     Studies: No results found.  Scheduled Meds: . collagenase   Topical Daily  . heparin subcutaneous  2,500 Units Subcutaneous Q12H  . metoprolol tartrate  25 mg Oral BID  . morphine CONCENTRATE  5 mg Oral 3 times per day   Continuous Infusions:    Time Spent: 15 min   Charlynne Cousins  [facility] Pager [phone].  If 7PM-7AM, please contact night-coverage at [email or web address], password [facility] 07/22/2015, 10:30 AM  LOS: 4 days

## 2015-07-23 DIAGNOSIS — E43 Unspecified severe protein-calorie malnutrition: Secondary | ICD-10-CM

## 2015-07-23 DIAGNOSIS — Z515 Encounter for palliative care: Secondary | ICD-10-CM

## 2015-07-23 LAB — CULTURE, BLOOD (ROUTINE X 2)
Culture: NO GROWTH
Culture: NO GROWTH

## 2015-07-23 MED ORDER — MORPHINE SULFATE (CONCENTRATE) 10 MG/0.5ML PO SOLN
7.5000 mg | ORAL | Status: DC
  Administered 2015-07-23: 7.6 mg via ORAL
  Filled 2015-07-23: qty 0.5

## 2015-07-23 MED ORDER — HYDROMORPHONE HCL 1 MG/ML IJ SOLN
0.5000 mg | INTRAMUSCULAR | Status: DC | PRN
  Administered 2015-07-23: 0.5 mg via INTRAVENOUS
  Filled 2015-07-23: qty 1

## 2015-07-23 MED ORDER — LORAZEPAM 2 MG/ML IJ SOLN
0.5000 mg | INTRAMUSCULAR | Status: DC | PRN
Start: 2015-07-23 — End: 2015-07-25

## 2015-07-23 NOTE — Plan of Care (Signed)
Problem: Discharge Progression Outcomes Goal: Hemodynamically stable Outcome: Not Progressing Tachy (in 100's).  BP stable. Pt began agonal breathing this a.m. Given Dilaudid.  PO morphine increased. Goal: Tolerating diet Outcome: Not Met (add Reason) Pt with no po intake today.

## 2015-07-23 NOTE — Clinical Social Work Note (Signed)
CSW reviewed consult for in patient hospice  CSW reviewed MD notes that reflect pt may be ready for discharge in 1 day.   CSW reviewed palliative MD note reflecting pt is actively dying  CSW met with RN who stated that Dr. Olevia Bowens informed her that pt is actively dying and should occur today.  Obtained information from RN that pt's POA is the director of [facility].  Will provide to week day CSW so that if pt survives and is stable for discharge   .Dede Query, LCSW [facility] Clinical Social Worker - Weekend Coverage cell #: [phone]

## 2015-07-23 NOTE — Progress Notes (Addendum)
[facility] PROGRESS NOTE  Assessment/Plan: Hypernatremia Pressure ulcer: Failure to thrive in adult: Anemia of chronic renal failure, stage 2 (mild) PAF history AKI on CKD (chronic kidney disease) stage 2, GFR 60-89 ml/min Dementia without behavioral disturbance  PMT met with HCPOA Arnetha Massy. It was decided to transition the patient towards comfort care. Awaiting residential hospice placement. Pt with agonal breathing.    Code Status: DNR Family Communication: none  Disposition Plan: d/c in 1 days.   Consultants:  PMT  Procedures:  CXR  Antibiotics:  Rocephin  HPI/Subjective: Non verbal  Objective: Filed Vitals:   07/21/15 2154 07/22/15 1634 07/22/15 2125 07/23/15 0528  BP: 122/64 136/69 134/58 118/81  Pulse: 94 108 101 109  Temp: 98 F (36.7 C) 98.4 F (36.9 C) 99.1 F (37.3 C) 99.1 F (37.3 C)  TempSrc: Oral Axillary Axillary Axillary  Resp: 16 16    Height:      Weight:      SpO2: 96% 91% 93% 90%    Intake/Output Summary (Last 24 hours) at 07/23/15 0924 Last data filed at 07/22/15 1700  Gross per 24 hour  Intake    420 ml  Output      0 ml  Net    420 ml   Filed Weights   07/18/15 2231  Weight: 20.5 kg (45 lb 3.1 oz)    Exam:  General:  in no acute distress.  cachectic HEENT: No bruits, no goiter.     Data Reviewed: Basic Metabolic Panel:  Recent Labs Lab 07/18/15 1743 07/19/15 0544 07/20/15 0420  NA 154* 152* 141  K 3.9 3.9 3.3*  CL 115* 117* 106  CO2 30 28 31   GLUCOSE 110* 109* 131*  BUN 34* 31* 21*  CREATININE 0.93 0.66 0.67  CALCIUM 8.9 8.3* 8.1*   Liver Function Tests:  Recent Labs Lab 07/18/15 1743  AST 18  ALT 11*  ALKPHOS 69  BILITOT 1.2  PROT 6.1*  ALBUMIN 2.8*   No results for input(s): LIPASE, AMYLASE in the last 168 hours. No results for input(s): AMMONIA in the last 168 hours. CBC:  Recent Labs Lab 07/18/15 1743 07/19/15 0544  WBC 7.2 5.3  NEUTROABS 5.1  --   HGB 12.6 11.8*   HCT 39.9 37.5  MCV 91.3 91.9  PLT 198 136*   Cardiac Enzymes: No results for input(s): CKTOTAL, CKMB, CKMBINDEX, TROPONINI in the last 168 hours. BNP (last 3 results)  Recent Labs  01/07/15 1307  BNP 137.9*    ProBNP (last 3 results) No results for input(s): PROBNP in the last 8760 hours.  CBG: No results for input(s): GLUCAP in the last 168 hours.  Recent Results (from the past 240 hour(s))  Urine culture     Status: None   Collection Time: 07/18/15  6:06 PM  Result Value Ref Range Status   Specimen Description URINE, CLEAN CATCH  Final   Special Requests NONE  Final   Culture   Final    >=100,000 COLONIES/mL METHICILLIN RESISTANT STAPHYLOCOCCUS AUREUS Performed at [facility]    Report Status 07/21/2015 FINAL  Final   Organism ID, Bacteria METHICILLIN RESISTANT STAPHYLOCOCCUS AUREUS  Final      Susceptibility   Methicillin resistant staphylococcus aureus - MIC*    CIPROFLOXACIN >=8 RESISTANT Resistant     GENTAMICIN <=0.5 SENSITIVE Sensitive     NITROFURANTOIN <=16 SENSITIVE Sensitive     OXACILLIN >=4 RESISTANT Resistant     TETRACYCLINE <=1 SENSITIVE Sensitive  VANCOMYCIN <=0.5 SENSITIVE Sensitive     TRIMETH/SULFA <=10 SENSITIVE Sensitive     CLINDAMYCIN <=0.25 RESISTANT Resistant     RIFAMPIN <=0.5 SENSITIVE Sensitive     Inducible Clindamycin POSITIVE Resistant     * >=100,000 COLONIES/mL METHICILLIN RESISTANT STAPHYLOCOCCUS AUREUS  Culture, blood (routine x 2)     Status: None (Preliminary result)   Collection Time: 07/18/15  7:49 PM  Result Value Ref Range Status   Specimen Description BLOOD RIGHT ANTECUBITAL  Final   Special Requests BOTTLES DRAWN AEROBIC AND ANAEROBIC 5CC  Final   Culture   Final    NO GROWTH 4 DAYS Performed at [facility]    Report Status PENDING  Incomplete  Culture, blood (routine x 2)     Status: None (Preliminary result)   Collection Time: 07/18/15  7:55 PM  Result Value Ref Range Status   Specimen  Description BLOOD LEFT ANTECUBITAL  Final   Special Requests BOTTLES DRAWN AEROBIC AND ANAEROBIC 5CC  Final   Culture   Final    NO GROWTH 4 DAYS Performed at [facility]    Report Status PENDING  Incomplete     Studies: No results found.  Scheduled Meds: . heparin subcutaneous  2,500 Units Subcutaneous Q12H  . morphine CONCENTRATE  5 mg Oral 4 times per day   Continuous Infusions:    Time Spent: 15 min   Charlynne Cousins  [facility] Pager [phone].  If 7PM-7AM, please contact night-coverage at [email or web address], password [facility] 07/23/2015, 9:24 AM  LOS: 5 days

## 2015-07-23 NOTE — Progress Notes (Signed)
Daily Progress Note   Patient Name: Deborah Gilbert       Date: 07/23/2015 DOB: 11/24/1919  Age: 81 y.o. MRN#: [id number] Attending Physician: Marinda Elk, MD Primary Care Physician: Evette Georges, MD Admit Date: 07/18/2015  Reason for Consultation/Follow-up: Non pain symptom management, Pain control and Terminal care  Subjective: Agonal respirations. Opens her eyes to touch. Non verbal  Interval Events: Continued decline Length of Stay: 5 days  Current Medications: Scheduled Meds:  . morphine CONCENTRATE  7.6 mg Oral 6 times per day    Continuous Infusions:    PRN Meds: HYDROmorphone (DILAUDID) injection, LORazepam, morphine CONCENTRATE, [DISCONTINUED] ondansetron **OR** ondansetron (ZOFRAN) IV, RESOURCE THICKENUP CLEAR  Palliative Performance Scale: 10%     Vital Signs: BP 118/81 mmHg  Pulse 104  Temp(Src) 99.1 F (37.3 C) (Axillary)  Resp 16  Ht  (1.549 m)  Wt 20.5 kg (45 lb 3.1 oz)  BMI 8.54 kg/m2  SpO2 90% SpO2: SpO2: 90 % O2 Device: O2 Device: Not Delivered O2 Flow Rate: O2 Flow Rate (L/min): 2 L/min  Intake/output summary:  Intake/Output Summary (Last 24 hours) at 07/23/15 1602 Last data filed at 07/23/15 1000  Gross per 24 hour  Intake      0 ml  Output      0 ml  Net      0 ml   LBM:   Baseline Weight: Weight: 20.5 kg (45 lb 3.1 oz) Most recent weight: Weight: 20.5 kg (45 lb 3.1 oz)  Physical Exam: General: Cachetic elderly female; dying Resp: Shallow agonal Cardiac: Irrg, distant              Additional Data Reviewed: No results for input(s): WBC, HGB, PLT, NA, BUN, CREATININE, ALB in the last 72 hours.   Problem List:  Patient Active Problem List   Diagnosis Date Noted  . Palliative care encounter   . Pressure ulcer 07/19/2015  . Hypernatremia 07/18/2015  . Dehydration 07/18/2015  . Failure to thrive in adult 07/18/2015  . History of CVA (cerebrovascular accident) 07/18/2015  . 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 CVA (cerebral infarction) 05/29/2015  . Conjunctivitis 05/29/2015  . Rib pain 01/07/2015  . Dehydration, mild 01/07/2015  . Abnormal urinalysis 01/07/2015  . Cellulitis of wrist 10/22/2014  . Cellulitis and abscess of hand 10/22/2014  . UTI (urinary tract infection) 10/22/2014  . Anemia of chronic renal failure, stage 2 (mild) 08/20/2014  . Dysphagia 08/20/2014  . Protein-calorie malnutrition, severe 08/19/2014  . PAF history 08/18/2014  . History of vertigo 08/18/2014  . Dementia without behavioral disturbance 08/18/2014  . CKD (chronic kidney disease) stage 2, GFR 60-89 ml/min 08/18/2014  . Atrial fibrillation 08/18/2014  . UTI (lower urinary tract infection) 02/26/2011  . Hearing loss 03/01/2010  . DEPRESSIVE DISORDER 01/02/2010  . OSTEOARTHRITIS 09/20/2009     Palliative Care Assessment & Plan    Code Status:  DNR  Goals of Care:  Comfort care. Pt is actively dying  Transfer to in-patient hospice is stable for transport  Symptom Management:  Pain: Needs improvement. Ill increase morphine concentrate 7.5 mg q4 atc and q2 prn. Dilaudid iv .25 q2 prn available for acute symptoms  Dyspnea: Primary management with opioids. See above. Cont 02 therapy as tolerated or indicated.    Psycho-social/Spiritual:  Desire for further Chaplaincy support:no   Prognosis: Hours - Days Discharge Planning: Hospice facility or could have hospital death     Thank you  for allowing the Palliative Medicine Team to assist in the care of this patient.   Time In: 1530 Time Out: 1555 Total Time 25 min Prolonged Time Billed  no     Greater than 50%  of this time was spent counseling and coordinating care related to the above assessment and plan.   Irean Hong, NP  07/23/2015, 4:02 PM  Please contact Palliative Medicine Team phone at [phone] for questions and concerns.

## 2015-07-24 MED ORDER — MORPHINE SULFATE (CONCENTRATE) 10 MG/0.5ML PO SOLN: 5.0000 mg | ORAL | Status: AC | PRN

## 2015-07-24 NOTE — Progress Notes (Signed)
CSW continuing to follow.  CSW received notification from MD that pt would be appropriate to explore residential hospice facilities.   CSW visited pt room and pt POA not present at bedside.   CSW made multiple attempts to contact pt POA, [name] Smith via telephone without success and without return phone calls. CSW received phone call from [facility] as CSW had checked with facility earlier about additional contact information and [facility] was able to obtain a cell phone number for pt POA, [name] [phone]).   CSW contacted pt POA, [name] via telephone. CSW introduced self and explained role. CSW discussed recommendation for residential hospice placement with pt POA. CSW offered choices for residential hospice. Pt POA chooses [facility].   CSW made referral to [facility], Forrestine Him.   CSW received notification from [facility], Forrestine Him that pt information reviewed and pt accepted to [facility] and bed available tomorrow 07/25/15.   CSW notified MD who stated that pt can discharge to [facility] place tomorrow if transfer makes sense at that time.  CSW to facilitate pt discharge to [facility] if transfer makes sense on 07/25/15.  Loletta Specter, MSW, LCSW Clinical Social Work [phone]

## 2015-07-24 NOTE — Discharge Summary (Addendum)
Physician Discharge Summary  Deborah Gilbert DJM:[id number] DOB: Mar 12, 1920 DOA: 07/18/2015  PCP: Joycelyn Man, MD  Admit date: 07/18/2015 Discharge date: 07/25/2015  Time spent:35 minutes  Recommendations for Outpatient Follow-up:  1. Will to residential hospice  Discharge Diagnoses:  Principal Problem:   Hypernatremia Active Problems:   Protein-calorie malnutrition, severe   PAF history   Dementia without behavioral disturbance   CKD (chronic kidney disease) stage 2, GFR 60-89 ml/min   Anemia of chronic renal failure, stage 2 (mild)   UTI (urinary tract infection)   Dehydration   Failure to thrive in adult   History of CVA (cerebrovascular accident)   Pressure ulcer   Palliative care encounter   Discharge Condition: guarded  Diet recommendation: comfort feeds  Filed Weights   07/18/15 2231  Weight: 20.5 kg (45 lb 3.1 oz)    History of present illness:  81 y.o. female from the [facility] SN[facility] who was sent to the ED due for evaluation of a right hip pressure ulcer. Per facility report patient has had decreased intake of foods and liquids for the past 3-4 days.  Hospital Course:  Hypernatremia Pressure ulcer: Failure to thrive in adult: Anemia of chronic renal failure, stage 2 (mild) PAF history AKI on CKD (chronic kidney disease) stage 2, GFR 60-89 ml/min Dementia without behavioral disturbance Her Hypernatremia improved with D5w. PMT met with HCPOA Deborah Gilbert and decided to move towrd comfort care.  Consultants:  PMT  Procedures:  CXR  Discharge Exam: Filed Vitals:   07/25/15 0608  BP: 115/56  Pulse: 93  Temp: 98.6 F (37 C)  Resp: 12    General: A&O x3 Cardiovascular: RRR Respiratory: good air movement CTA B/L  Discharge Instructions   Discharge Instructions    Diet - low sodium heart healthy    Complete by:  As directed      Increase activity slowly    Complete by:  As directed           Current Discharge Medication List     START taking these medications   Details  Morphine Sulfate (MORPHINE CONCENTRATE) 10 MG/0.5ML SOLN concentrated solution Take 0.25 mLs (5 mg total) by mouth every 2 (two) hours as needed for moderate pain, severe pain, anxiety or shortness of breath. Qty: 42 mL, Refills: 0      STOP taking these medications     aspirin 325 MG tablet      atorvastatin (LIPITOR) 20 MG tablet      Calcium Carbonate-Vitamin D (CALCIUM 600+D) 600-400 MG-UNIT per tablet      citalopram (CELEXA) 10 MG tablet      Cranberry 250 MG CAPS      magnesium hydroxide (MILK OF MAGNESIA) 400 MG/5ML suspension      meclizine (ANTIVERT) 12.5 MG tablet      metoprolol tartrate (LOPRESSOR) 25 MG tablet      beta carotene w/minerals (OCUVITE) tablet      ciprofloxacin (CIPRO) 500 MG tablet      erythromycin ophthalmic ointment        Allergies  Allergen Reactions  . Famotidine     REACTION: "felt like a heart attack"  . Pepcid [Famotidine] Other (See Comments)    Called patient's nursing home and they do not have any reactions known  . Tetracycline     REACTION: Severe nausea and vomiting  . Tetracyclines & Related     Per MAR  . Trimethoprim     Per [facility]  The results of significant diagnostics from this hospitalization (including imaging, microbiology, ancillary and laboratory) are listed below for reference.    Significant Diagnostic Studies: Dg Chest 1 View  07/18/2015   CLINICAL DATA:  Pressure ulcer RIGHT hip worsening over 2 weeks, mental status changes over same time frame, not eating, dementia  EXAM: CHEST  1 VIEW  COMPARISON:  05/29/2015  FINDINGS: Rotated to the LEFT.  Normal heart size, mediastinal contours and pulmonary vascularity.  Emphysematous changes with biapical scarring.  No gross acute infiltrate, pleural effusion or pneumothorax.  Significant osseous demineralization.  IMPRESSION: COPD changes with biapical scarring.  No acute abnormalities.   Electronically Signed   By: Lavonia Dana M.D.   On: 07/18/2015 17:48   Dg Hip Unilat With Pelvis 1v Right  07/18/2015   CLINICAL DATA:  Pressure ulcer RIGHT hip worsening over 2 weeks, mental status changes for 2 weeks  EXAM: DG HIP (WITH OR WITHOUT PELVIS) 1V RIGHT  COMPARISON:  None  FINDINGS: Marked osseous demineralization.  RIGHT hip prosthesis.  Old healed fracture inferior RIGHT pubic ramus.  No acute fracture, dislocation or bone destruction.  Lateral margins of the iliac crest superiorly are overpenetrated and inadequately assessed.  No periprosthetic lucency or gross evidence of osteomyelitis.  IMPRESSION: Osseous demineralization with RIGHT hip prosthesis.  No acute abnormalities.   Electronically Signed   By: Lavonia Dana M.D.   On: 07/18/2015 17:50    Microbiology: Recent Results (from the past 240 hour(s))  Urine culture     Status: None   Collection Time: 07/18/15  6:06 PM  Result Value Ref Range Status   Specimen Description URINE, CLEAN CATCH  Final   Special Requests NONE  Final   Culture   Final    >=100,000 COLONIES/mL METHICILLIN RESISTANT STAPHYLOCOCCUS AUREUS Performed at [facility]    Report Status 07/21/2015 FINAL  Final   Organism ID, Bacteria METHICILLIN RESISTANT STAPHYLOCOCCUS AUREUS  Final      Susceptibility   Methicillin resistant staphylococcus aureus - MIC*    CIPROFLOXACIN >=8 RESISTANT Resistant     GENTAMICIN <=0.5 SENSITIVE Sensitive     NITROFURANTOIN <=16 SENSITIVE Sensitive     OXACILLIN >=4 RESISTANT Resistant     TETRACYCLINE <=1 SENSITIVE Sensitive     VANCOMYCIN <=0.5 SENSITIVE Sensitive     TRIMETH/SULFA <=10 SENSITIVE Sensitive     CLINDAMYCIN <=0.25 RESISTANT Resistant     RIFAMPIN <=0.5 SENSITIVE Sensitive     Inducible Clindamycin POSITIVE Resistant     * >=100,000 COLONIES/mL METHICILLIN RESISTANT STAPHYLOCOCCUS AUREUS  Culture, blood (routine x 2)     Status: None   Collection Time: 07/18/15  7:49 PM  Result Value Ref Range Status   Specimen Description  BLOOD RIGHT ANTECUBITAL  Final   Special Requests BOTTLES DRAWN AEROBIC AND ANAEROBIC 5CC  Final   Culture   Final    NO GROWTH 5 DAYS Performed at [facility]    Report Status 07/23/2015 FINAL  Final  Culture, blood (routine x 2)     Status: None   Collection Time: 07/18/15  7:55 PM  Result Value Ref Range Status   Specimen Description BLOOD LEFT ANTECUBITAL  Final   Special Requests BOTTLES DRAWN AEROBIC AND ANAEROBIC 5CC  Final   Culture   Final    NO GROWTH 5 DAYS Performed at [facility]    Report Status 07/23/2015 FINAL  Final     Labs: Basic Metabolic Panel:  Recent Labs Lab  07/18/15 1743 07/19/15 0544 07/20/15 0420  NA 154* 152* 141  K 3.9 3.9 3.3*  CL 115* 117* 106  CO2 30 28 31   GLUCOSE 110* 109* 131*  BUN 34* 31* 21*  CREATININE 0.93 0.66 0.67  CALCIUM 8.9 8.3* 8.1*   Liver Function Tests:  Recent Labs Lab 07/18/15 1743  AST 18  ALT 11*  ALKPHOS 69  BILITOT 1.2  PROT 6.1*  ALBUMIN 2.8*   No results for input(s): LIPASE, AMYLASE in the last 168 hours. No results for input(s): AMMONIA in the last 168 hours. CBC:  Recent Labs Lab 07/18/15 1743 07/19/15 0544  WBC 7.2 5.3  NEUTROABS 5.1  --   HGB 12.6 11.8*  HCT 39.9 37.5  MCV 91.3 91.9  PLT 198 136*   Cardiac Enzymes: No results for input(s): CKTOTAL, CKMB, CKMBINDEX, TROPONINI in the last 168 hours. BNP: BNP (last 3 results)  Recent Labs  01/07/15 1307  BNP 137.9*    ProBNP (last 3 results) No results for input(s): PROBNP in the last 8760 hours.  CBG: No results for input(s): GLUCAP in the last 168 hours.     Signed:  Charlynne Cousins  [facility] 07/25/2015, 8:02 AM

## 2015-07-25 NOTE — Progress Notes (Signed)
[facility] PROGRESS NOTE  Assessment/Plan: Hypernatremia Pressure ulcer: Failure to thrive in adult: Anemia of chronic renal failure, stage 2 (mild) PAF history AKI on CKD (chronic kidney disease) stage 2, GFR 60-89 ml/min Dementia without behavioral disturbance  PMT met with HCPOA Arnetha Massy. It was decided to transition the patient towards comfort care. Awaiting residential hospice placement.     Code Status: DNR Family Communication: none  Disposition Plan: d/c in 1 days.   Consultants:  PMT  Procedures:  CXR  Antibiotics:  Rocephin  HPI/Subjective: Non verbal  Objective: Filed Vitals:   07/24/15 0618 07/24/15 1300 07/24/15 2211 07/25/15 0608  BP: 113/57 118/62 103/54 115/56  Pulse: 94 92 96 93  Temp: 98.5 F (36.9 C) 98.2 F (36.8 C) 98.4 F (36.9 C) 98.6 F (37 C)  TempSrc: Axillary Axillary Axillary Axillary  Resp: 14 15 14 12   Height:      Weight:      SpO2: 90% 93% 92% 90%    Intake/Output Summary (Last 24 hours) at 07/25/15 0802 Last data filed at 07/24/15 1206  Gross per 24 hour  Intake    240 ml  Output      0 ml  Net    240 ml   Filed Weights   07/18/15 2231  Weight: 20.5 kg (45 lb 3.1 oz)    Exam:  General:  in no acute distress.  cachectic HEENT: No bruits, no goiter.     Data Reviewed: Basic Metabolic Panel:  Recent Labs Lab 07/18/15 1743 07/19/15 0544 07/20/15 0420  NA 154* 152* 141  K 3.9 3.9 3.3*  CL 115* 117* 106  CO2 30 28 31   GLUCOSE 110* 109* 131*  BUN 34* 31* 21*  CREATININE 0.93 0.66 0.67  CALCIUM 8.9 8.3* 8.1*   Liver Function Tests:  Recent Labs Lab 07/18/15 1743  AST 18  ALT 11*  ALKPHOS 69  BILITOT 1.2  PROT 6.1*  ALBUMIN 2.8*   No results for input(s): LIPASE, AMYLASE in the last 168 hours. No results for input(s): AMMONIA in the last 168 hours. CBC:  Recent Labs Lab 07/18/15 1743 07/19/15 0544  WBC 7.2 5.3  NEUTROABS 5.1  --   HGB 12.6 11.8*  HCT 39.9 37.5    MCV 91.3 91.9  PLT 198 136*   Cardiac Enzymes: No results for input(s): CKTOTAL, CKMB, CKMBINDEX, TROPONINI in the last 168 hours. BNP (last 3 results)  Recent Labs  01/07/15 1307  BNP 137.9*    ProBNP (last 3 results) No results for input(s): PROBNP in the last 8760 hours.  CBG: No results for input(s): GLUCAP in the last 168 hours.  Recent Results (from the past 240 hour(s))  Urine culture     Status: None   Collection Time: 07/18/15  6:06 PM  Result Value Ref Range Status   Specimen Description URINE, CLEAN CATCH  Final   Special Requests NONE  Final   Culture   Final    >=100,000 COLONIES/mL METHICILLIN RESISTANT STAPHYLOCOCCUS AUREUS Performed at [facility]    Report Status 07/21/2015 FINAL  Final   Organism ID, Bacteria METHICILLIN RESISTANT STAPHYLOCOCCUS AUREUS  Final      Susceptibility   Methicillin resistant staphylococcus aureus - MIC*    CIPROFLOXACIN >=8 RESISTANT Resistant     GENTAMICIN <=0.5 SENSITIVE Sensitive     NITROFURANTOIN <=16 SENSITIVE Sensitive     OXACILLIN >=4 RESISTANT Resistant     TETRACYCLINE <=1 SENSITIVE Sensitive     VANCOMYCIN <=  0.5 SENSITIVE Sensitive     TRIMETH/SULFA <=10 SENSITIVE Sensitive     CLINDAMYCIN <=0.25 RESISTANT Resistant     RIFAMPIN <=0.5 SENSITIVE Sensitive     Inducible Clindamycin POSITIVE Resistant     * >=100,000 COLONIES/mL METHICILLIN RESISTANT STAPHYLOCOCCUS AUREUS  Culture, blood (routine x 2)     Status: None   Collection Time: 07/18/15  7:49 PM  Result Value Ref Range Status   Specimen Description BLOOD RIGHT ANTECUBITAL  Final   Special Requests BOTTLES DRAWN AEROBIC AND ANAEROBIC 5CC  Final   Culture   Final    NO GROWTH 5 DAYS Performed at [facility]    Report Status 07/23/2015 FINAL  Final  Culture, blood (routine x 2)     Status: None   Collection Time: 07/18/15  7:55 PM  Result Value Ref Range Status   Specimen Description BLOOD LEFT ANTECUBITAL  Final   Special  Requests BOTTLES DRAWN AEROBIC AND ANAEROBIC 5CC  Final   Culture   Final    NO GROWTH 5 DAYS Performed at [facility]    Report Status 07/23/2015 FINAL  Final     Studies: No results found.  Scheduled Meds: . morphine CONCENTRATE  7.6 mg Oral 6 times per day   Continuous Infusions:    Time Spent: 15 min   Charlynne Cousins  [facility] Pager [phone].  If 7PM-7AM, please contact night-coverage at [email or web address], password [facility] 07/25/2015, 8:02 AM  LOS: 7 days

## 2015-07-25 NOTE — Progress Notes (Signed)
Pt for d/c to [facility].  BSW Intern facilitated d/c by confirming with MD pt stable to d/c, [employer] faxed d/c summary, notifying pt POA, [name] via telephone, providing RN phone number to call report, and arranging ambulance transport for pt to [facility].  No further SW needs at this time.   BSW Intern signing off.  Scharlene Gloss, [place] Intern [phone]

## 2015-07-25 NOTE — Care Management Important Message (Signed)
Important Message  Patient Details IM Letter given to Suzanne/Case Manager to present to Patient.Important Message  Patient Details  Name: ULLA MCKIERNAN MRN: [id number] Date of Birth: 18-Oct-1920   Medicare Important Message Given:  Yes-third notification given    Haskell Flirt 07/25/2015, 11:05 AM Name: SIDNI FUSCO MRN: [id number] Date of Birth: April 28, 1920   Medicare Important Message Given:  Yes-third notification given    Haskell Flirt 07/25/2015, 11:04 AM

## 2015-07-25 NOTE — Progress Notes (Addendum)
Called [facility] and gave report to [place] the receiving nurse. Answered all questions and discharged with PTAR to [facility].

## 2015-08-05 DEATH — deceased

## 2016-06-04 IMAGING — CR DG HIP (WITH OR WITHOUT PELVIS) 1V*R*
3 series · 3 of 3 positions shown · non-contrast
Comparison: None

CLINICAL DATA: Pressure ulcer RIGHT hip worsening over 2 weeks,
mental status changes for 2 weeks

EXAM:
DG HIP (WITH OR WITHOUT PELVIS) 1V RIGHT

[x pelvis]
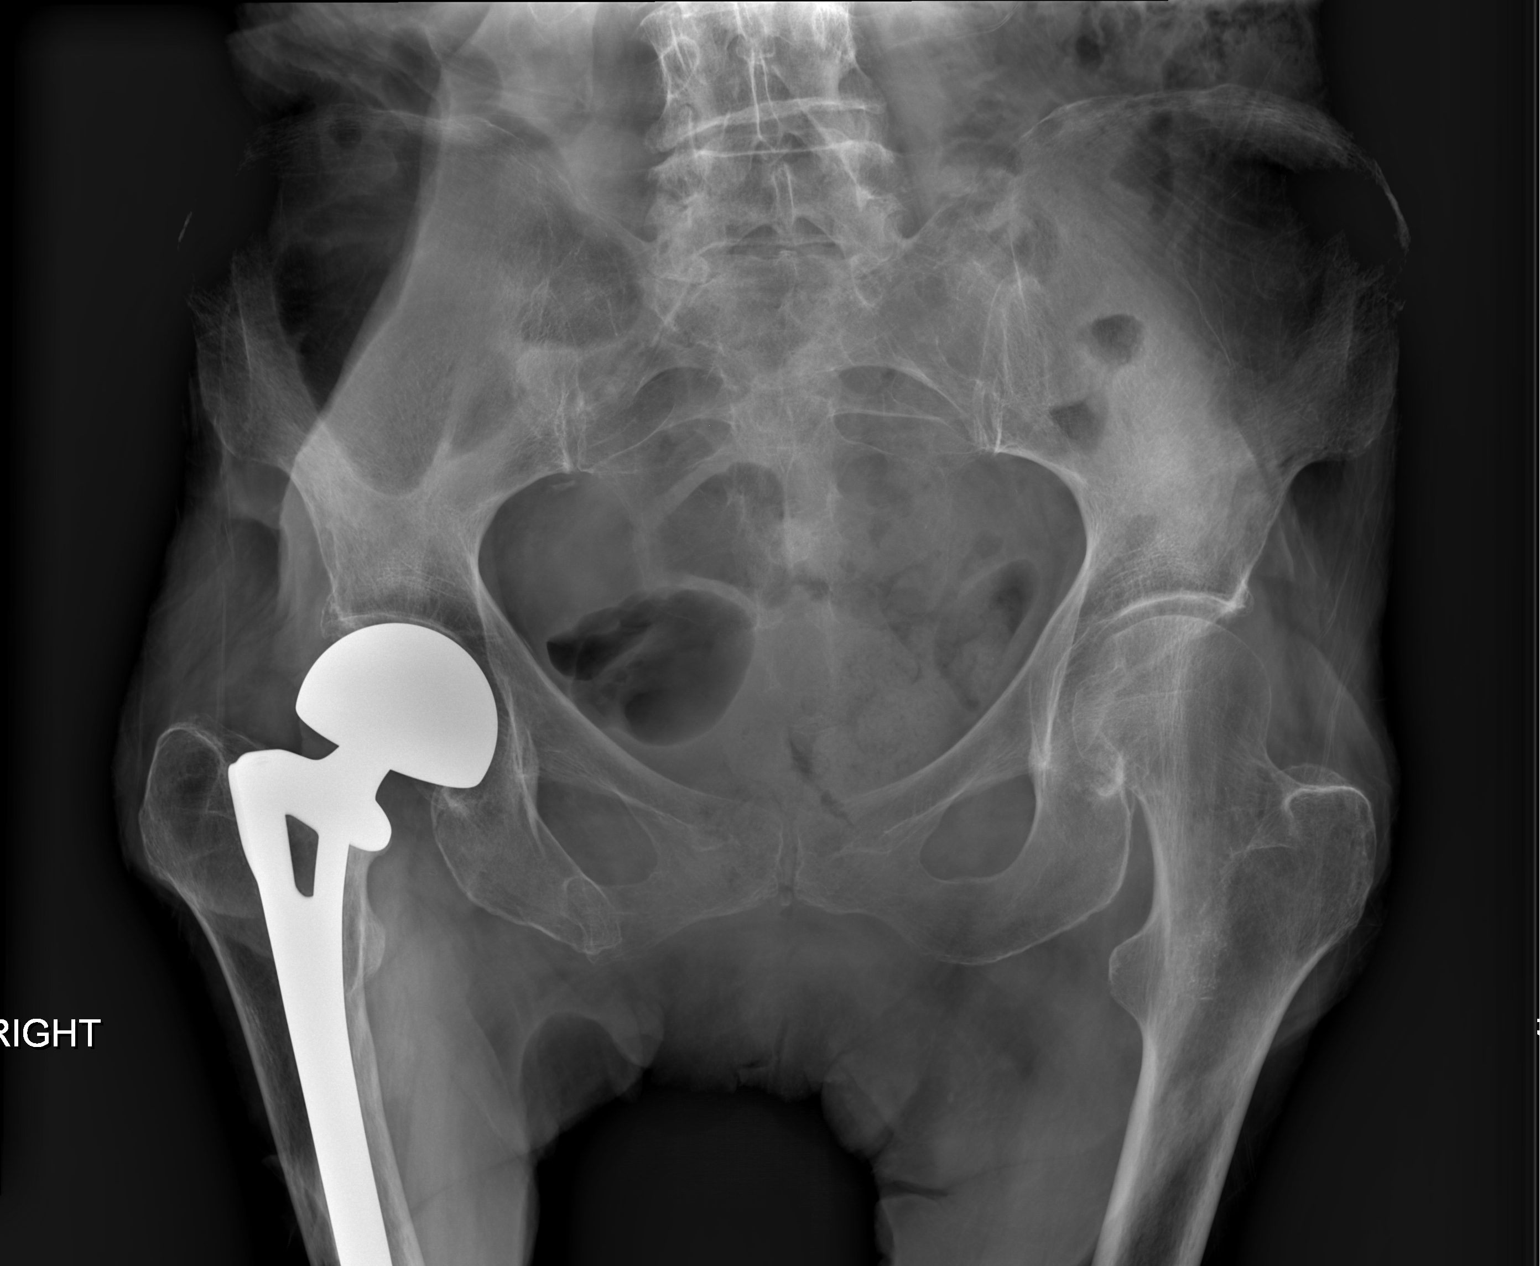

[x hip ap right]
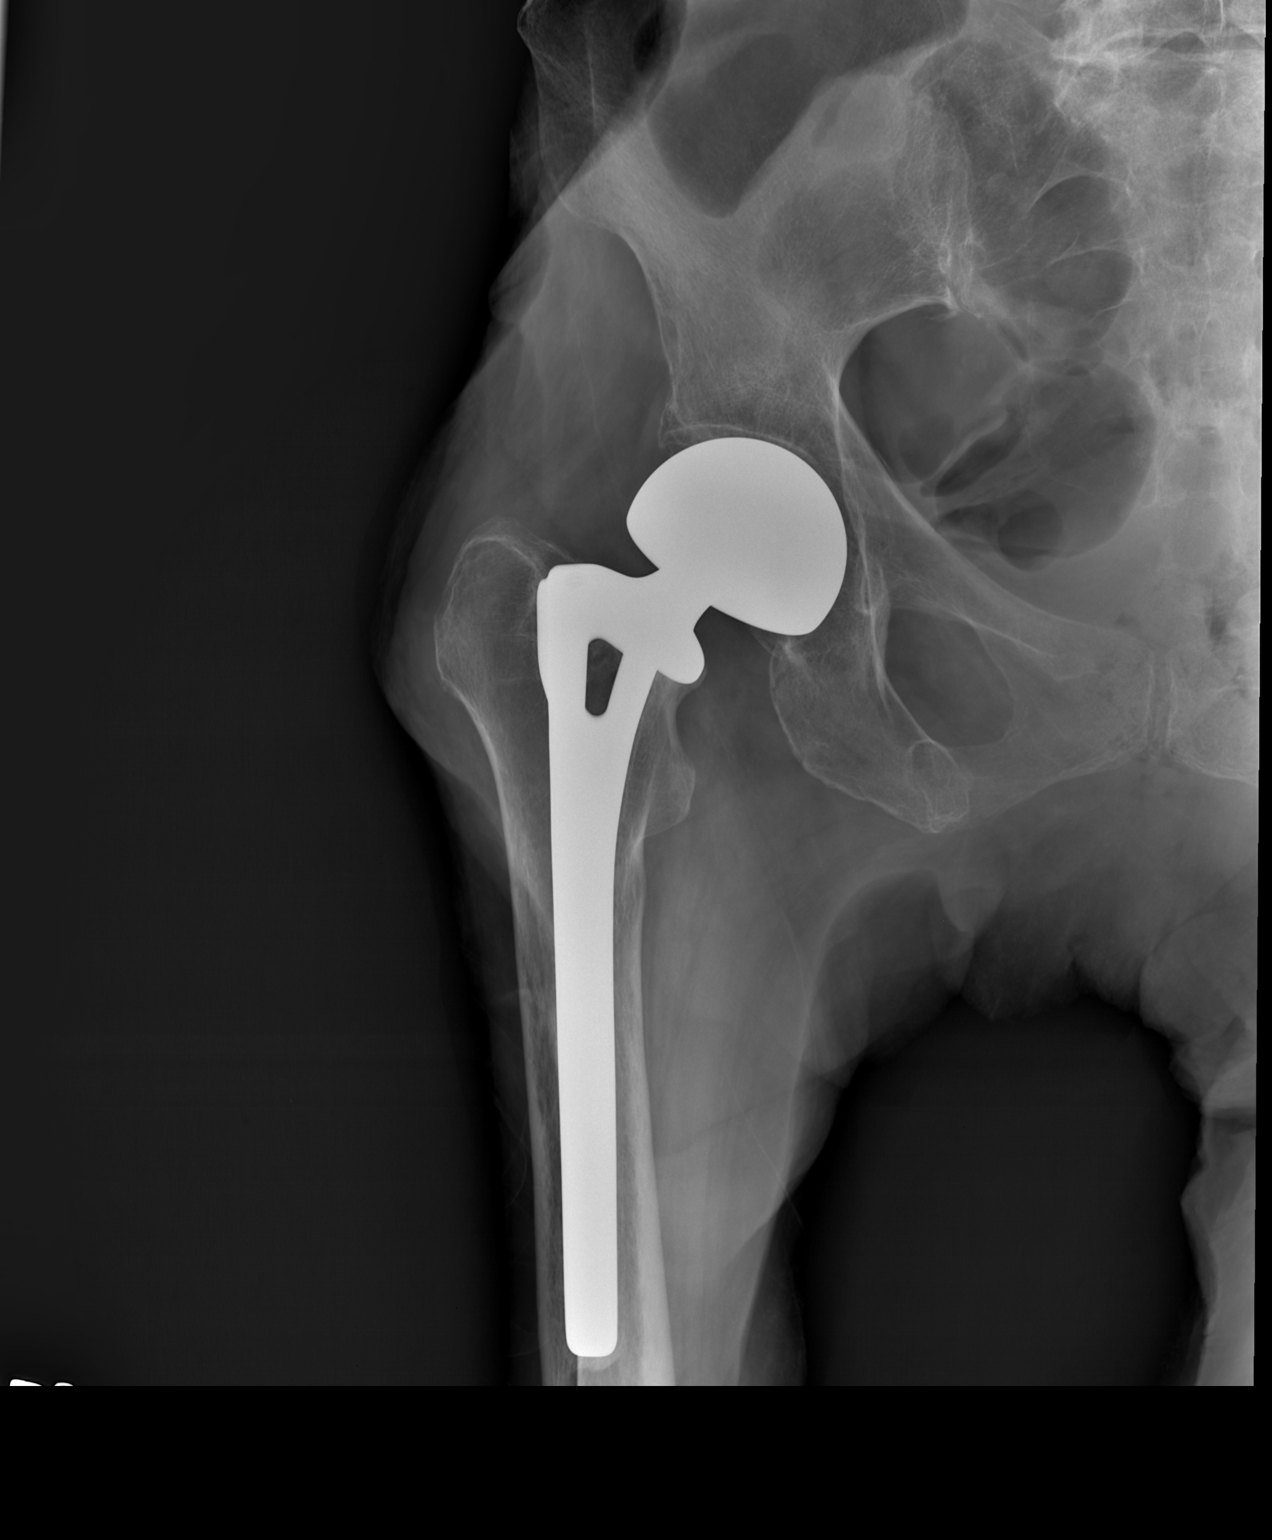

[w hip lat right]
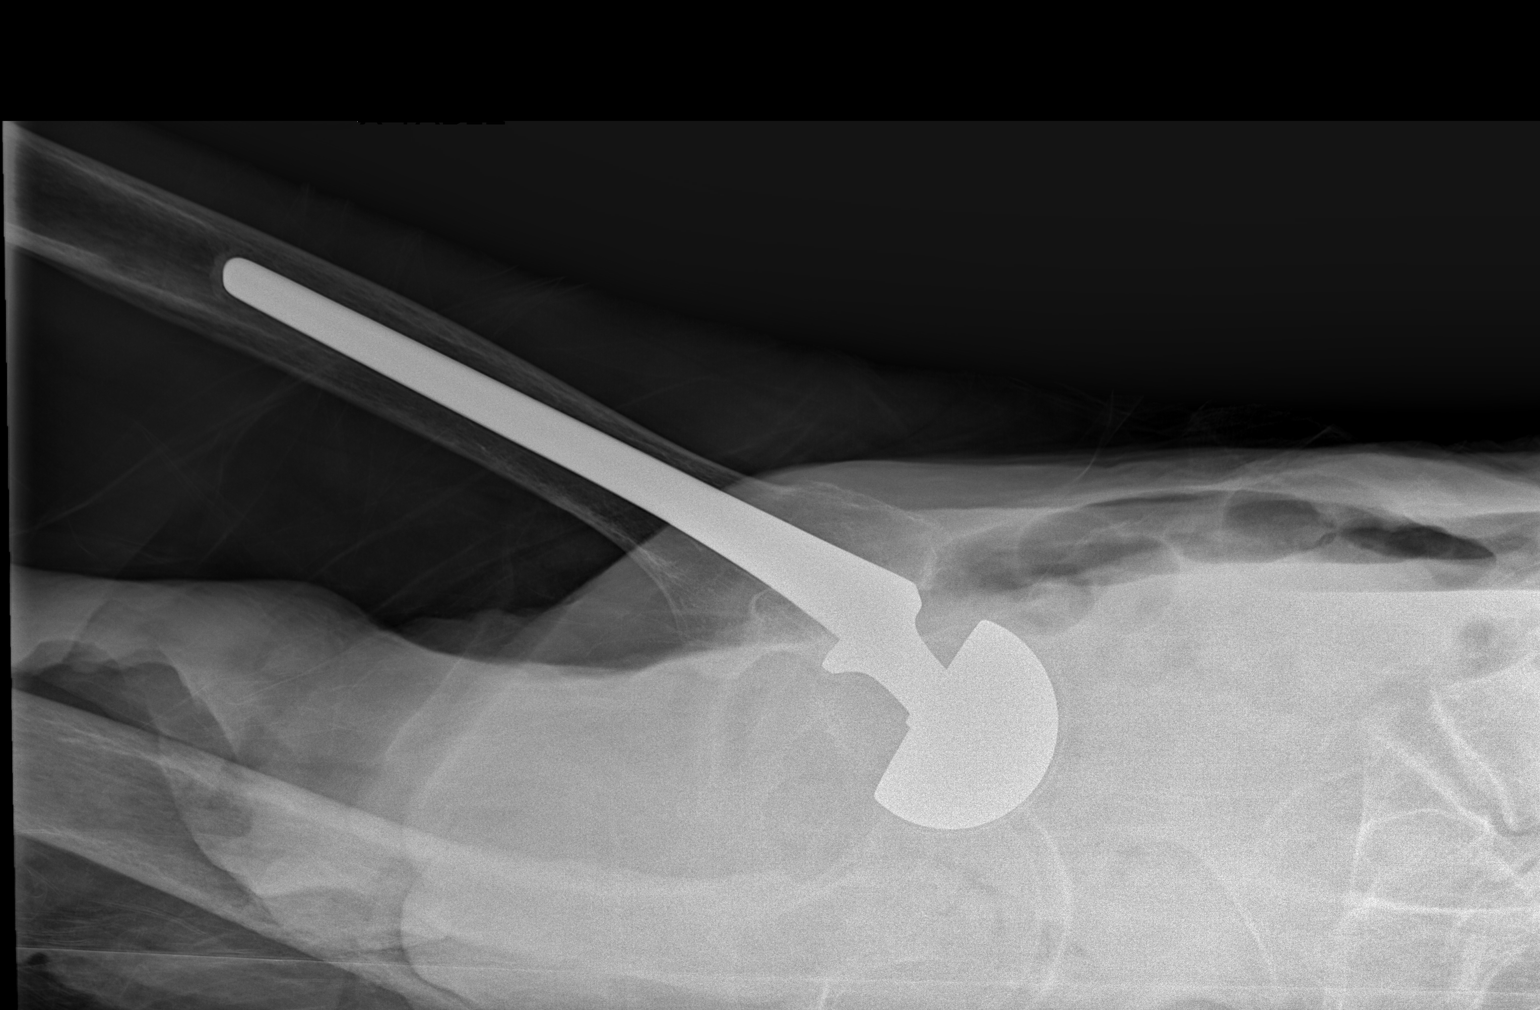

[3 of 3 positions shown; findings below may reference images not displayed]

FINDINGS: Marked osseous demineralization.

RIGHT hip prosthesis.

Old healed fracture inferior RIGHT pubic ramus.

No acute fracture, dislocation or bone destruction.

Lateral margins of the iliac crest superiorly are overpenetrated and
inadequately assessed.

No periprosthetic lucency or gross evidence of osteomyelitis.
IMPRESSION: Osseous demineralization with RIGHT hip prosthesis.

No acute abnormalities.
